# Patient Record
Sex: Male | Born: 1960 | Race: White | Hispanic: No | Marital: Single | Smoking: Never smoker
Health system: Southern US, Community
[De-identification: ages and names within clinical notes are randomized; demographics above are authoritative.]

## PROBLEM LIST (undated history)

## (undated) DIAGNOSIS — M1990 Unspecified osteoarthritis, unspecified site: Secondary | ICD-10-CM

## (undated) DIAGNOSIS — C61 Malignant neoplasm of prostate: Secondary | ICD-10-CM

## (undated) DIAGNOSIS — Z801 Family history of malignant neoplasm of trachea, bronchus and lung: Secondary | ICD-10-CM

## (undated) DIAGNOSIS — C772 Secondary and unspecified malignant neoplasm of intra-abdominal lymph nodes: Secondary | ICD-10-CM

## (undated) DIAGNOSIS — Z87442 Personal history of urinary calculi: Secondary | ICD-10-CM

## (undated) DIAGNOSIS — Z807 Family history of other malignant neoplasms of lymphoid, hematopoietic and related tissues: Secondary | ICD-10-CM

## (undated) DIAGNOSIS — B1910 Unspecified viral hepatitis B without hepatic coma: Secondary | ICD-10-CM

## (undated) DIAGNOSIS — Z8042 Family history of malignant neoplasm of prostate: Secondary | ICD-10-CM

## (undated) HISTORY — DX: Secondary and unspecified malignant neoplasm of intra-abdominal lymph nodes: C61

## (undated) HISTORY — PX: PROSTATE BIOPSY: SHX241

## (undated) HISTORY — DX: Family history of malignant neoplasm of prostate: Z80.42

## (undated) HISTORY — DX: Family history of malignant neoplasm of trachea, bronchus and lung: Z80.1

## (undated) HISTORY — DX: Family history of other malignant neoplasms of lymphoid, hematopoietic and related tissues: Z80.7

## (undated) HISTORY — DX: Malignant neoplasm of prostate: C77.2

## (undated) HISTORY — PX: LEG SURGERY: SHX1003

---
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
## 2017-07-12 ENCOUNTER — Other Ambulatory Visit: Payer: Self-pay

## 2017-07-12 DIAGNOSIS — Y929 Unspecified place or not applicable: Secondary | ICD-10-CM | POA: Insufficient documentation

## 2017-07-12 DIAGNOSIS — T18128A Food in esophagus causing other injury, initial encounter: Secondary | ICD-10-CM | POA: Insufficient documentation

## 2017-07-12 DIAGNOSIS — X58XXXA Exposure to other specified factors, initial encounter: Secondary | ICD-10-CM | POA: Insufficient documentation

## 2017-07-12 NOTE — ED Triage Notes (Signed)
Pt brought to ED by [facility] from [facility] for a chicken bond stuck on his throat, DG from [place] shows the bond on his throat, pt sent here for further evaluation.

## 2017-07-13 ENCOUNTER — Encounter (HOSPITAL_COMMUNITY)
Admission: EM | Disposition: A | Discharge status: Home / Self Care | Payer: Self-pay | Source: Home / Self Care | Attending: Emergency Medicine

## 2017-07-13 ENCOUNTER — Ambulatory Visit (HOSPITAL_COMMUNITY)
Admission: EM | Admit: 2017-07-13 | Discharge: 2017-07-13 | Disposition: A | Discharge status: Home / Self Care | Payer: Self-pay | Attending: Emergency Medicine | Admitting: Emergency Medicine

## 2017-07-13 ENCOUNTER — Encounter (HOSPITAL_COMMUNITY): Payer: Self-pay | Admitting: Emergency Medicine

## 2017-07-13 ENCOUNTER — Other Ambulatory Visit: Payer: Self-pay

## 2017-07-13 ENCOUNTER — Emergency Department (HOSPITAL_COMMUNITY): Payer: Self-pay | Admitting: Anesthesiology

## 2017-07-13 DIAGNOSIS — T17208A Unspecified foreign body in pharynx causing other injury, initial encounter: Secondary | ICD-10-CM

## 2017-07-13 HISTORY — PX: FOREIGN BODY REMOVAL: SHX962

## 2017-07-13 HISTORY — PX: RIGID ESOPHAGOSCOPY: SHX5226

## 2017-07-13 SURGERY — ESOPHAGOSCOPY, RIGID
Anesthesia: General | Site: Throat

## 2017-07-13 MED ORDER — ONDANSETRON HCL 4 MG/2ML IJ SOLN
INTRAMUSCULAR | Status: DC | PRN
  Administered 2017-07-13: 4 mg via INTRAVENOUS

## 2017-07-13 MED ORDER — FENTANYL CITRATE (PF) 250 MCG/5ML IJ SOLN
INTRAMUSCULAR | Status: AC
  Filled 2017-07-13: qty 5

## 2017-07-13 MED ORDER — PROPOFOL 10 MG/ML IV BOLUS
INTRAVENOUS | Status: AC
  Filled 2017-07-13: qty 20

## 2017-07-13 MED ORDER — 0.9 % SODIUM CHLORIDE (POUR BTL) OPTIME
TOPICAL | Status: DC | PRN
  Administered 2017-07-13: 1000 mL

## 2017-07-13 MED ORDER — LIDOCAINE VISCOUS 2 % MT SOLN
30.0000 mL | Freq: Once | OROMUCOSAL | Status: AC
  Administered 2017-07-13: 30 mL via OROMUCOSAL
  Filled 2017-07-13: qty 30

## 2017-07-13 MED ORDER — LIDOCAINE HCL (CARDIAC) 20 MG/ML IV SOLN
INTRAVENOUS | Status: DC | PRN
  Administered 2017-07-13: 80 mg via INTRATRACHEAL

## 2017-07-13 MED ORDER — SUCCINYLCHOLINE 20MG/ML (10ML) SYRINGE FOR MEDFUSION PUMP - OPTIME
INTRAMUSCULAR | Status: DC | PRN
  Administered 2017-07-13: 120 mg via INTRAVENOUS

## 2017-07-13 MED ORDER — MIDAZOLAM HCL 2 MG/2ML IJ SOLN
INTRAMUSCULAR | Status: AC
  Filled 2017-07-13: qty 2

## 2017-07-13 MED ORDER — LIDOCAINE 2% (20 MG/ML) 5 ML SYRINGE
INTRAMUSCULAR | Status: AC
  Filled 2017-07-13: qty 5

## 2017-07-13 MED ORDER — DEXAMETHASONE SODIUM PHOSPHATE 10 MG/ML IJ SOLN
INTRAMUSCULAR | Status: DC | PRN
  Administered 2017-07-13: 10 mg via INTRAVENOUS

## 2017-07-13 MED ORDER — FENTANYL CITRATE (PF) 250 MCG/5ML IJ SOLN
INTRAMUSCULAR | Status: DC | PRN
  Administered 2017-07-13: 50 ug via INTRAVENOUS

## 2017-07-13 MED ORDER — SUCCINYLCHOLINE CHLORIDE 200 MG/10ML IV SOSY
PREFILLED_SYRINGE | INTRAVENOUS | Status: AC
  Filled 2017-07-13: qty 10

## 2017-07-13 MED ORDER — MIDAZOLAM HCL 2 MG/2ML IJ SOLN
INTRAMUSCULAR | Status: DC | PRN
  Administered 2017-07-13: 2 mg via INTRAVENOUS

## 2017-07-13 MED ORDER — PROPOFOL 10 MG/ML IV BOLUS
INTRAVENOUS | Status: DC | PRN
  Administered 2017-07-13: 200 mg via INTRAVENOUS

## 2017-07-13 MED ORDER — OXYMETAZOLINE HCL 0.05 % NA SOLN
1.0000 | Freq: Once | NASAL | Status: AC
  Administered 2017-07-13: 1 via NASAL
  Filled 2017-07-13: qty 15

## 2017-07-13 MED ORDER — LACTATED RINGERS IV SOLN
INTRAVENOUS | Status: DC | PRN
  Administered 2017-07-13: 02:00:00 via INTRAVENOUS

## 2017-07-13 MED ORDER — FENTANYL CITRATE (PF) 100 MCG/2ML IJ SOLN: 25.0000 ug | INTRAMUSCULAR | Status: DC | PRN

## 2017-07-13 SURGICAL SUPPLY — 21 items
BALLN PULM 15 16.5 18X75 (BALLOONS)
BALLOON PULM 15 16.5 18X75 (BALLOONS) IMPLANT
CANISTER SUCT 3000ML PPV (MISCELLANEOUS) ×2 IMPLANT
CONT SPEC 4OZ CLIKSEAL STRL BL (MISCELLANEOUS) IMPLANT
COVER BACK TABLE 60X90IN (DRAPES) ×2 IMPLANT
COVER MAYO STAND STRL (DRAPES) ×2 IMPLANT
COVER SURGICAL LIGHT HANDLE (MISCELLANEOUS) ×2 IMPLANT
DRAPE HALF SHEET 40X57 (DRAPES) ×2 IMPLANT
GAUZE SPONGE 4X4 16PLY XRAY LF (GAUZE/BANDAGES/DRESSINGS) ×2 IMPLANT
GLOVE BIO SURGEON STRL SZ 6.5 (GLOVE) ×2 IMPLANT
GUARD TEETH (MISCELLANEOUS) IMPLANT
KIT BASIN OR (CUSTOM PROCEDURE TRAY) ×2 IMPLANT
KIT ROOM TURNOVER OR (KITS) ×2 IMPLANT
NS IRRIG 1000ML POUR BTL (IV SOLUTION) ×2 IMPLANT
PAD ARMBOARD 7.5X6 YLW CONV (MISCELLANEOUS) ×4 IMPLANT
PATTIES SURGICAL .5 X1 (DISPOSABLE) IMPLANT
PATTIES SURGICAL .5X1.5 (GAUZE/BANDAGES/DRESSINGS) ×2 IMPLANT
SURGILUBE 2OZ TUBE FLIPTOP (MISCELLANEOUS) IMPLANT
TOWEL NATURAL 6PK STERILE (DISPOSABLE) ×2 IMPLANT
TUBE CONNECTING 12X1/4 (SUCTIONS) ×2 IMPLANT
WATER STERILE IRR 1000ML POUR (IV SOLUTION) ×2 IMPLANT

## 2017-07-13 NOTE — ED Provider Notes (Signed)
[place] EMERGENCY DEPARTMENT Provider Note   CSN: [id number] Arrival date & time: 07/12/17  2349     History   Chief Complaint Chief Complaint  Patient presents with  . Foreing Body in throat    HPI Randall Dawson is a 56 y.o. male.  The history is provided by the patient and medical records.     56 y.o. M presenting to the ED from [facility] to see ENT for chicken bone in the throat.  Patient was eating dinner from [facility] around 6:30 PM and felt like he got some bone stuck in the throat.  States he has had trouble swallowing but no trouble breathing, etc.  No hx of swallowing issues in the past.  No prior EGDs.  History reviewed. No pertinent past medical history.  There are no active problems to display for this patient.   History reviewed. No pertinent surgical history.     Home Medications    Prior to Admission medications   Not on File    Family History History reviewed. No pertinent family history.  Social History Social History   Tobacco Use  . Smoking status: Never Smoker  . Smokeless tobacco: Never Used  Substance Use Topics  . Alcohol use: No    Frequency: Never  . Drug use: No     Allergies   Patient has no known allergies.   Review of Systems Review of Systems  HENT: Positive for trouble swallowing.   All other systems reviewed and are negative.    Physical Exam Updated Vital Signs BP 130/84 (BP Location: Right Arm)   Pulse 64   Temp (!) 97.5 F (36.4 C) (Oral)   Resp 16   SpO2 100%   Physical Exam  Constitutional: He is oriented to person, place, and time. He appears well-developed and well-nourished.  HENT:  Head: Normocephalic and atraumatic.  Mouth/Throat: Oropharynx is clear and moist.  Posterior oropharynx clear; handling secretions well, appears to have discomfort with talking, no stridor  Eyes: Conjunctivae and EOM are normal. Pupils are equal, round, and reactive to light.  Neck:  Normal range of motion.  Cardiovascular: Normal rate, regular rhythm and normal heart sounds.  Pulmonary/Chest: Effort normal and breath sounds normal. No stridor. No respiratory distress.  Abdominal: Soft. Bowel sounds are normal. There is no tenderness. There is no rebound.  Musculoskeletal: Normal range of motion.  Neurological: He is alert and oriented to person, place, and time.  Skin: Skin is warm and dry.  Psychiatric: He has a normal mood and affect.  Nursing note and vitals reviewed.    ED Treatments / Results  Labs (all labs ordered are listed, but only abnormal results are displayed) Labs Reviewed - No data to display  EKG  EKG Interpretation None       Radiology No results found.  Procedures Procedures (including critical care time)  Medications Ordered in ED Medications - No data to display   Initial Impression / Assessment and Plan / ED Course  I have reviewed the triage vital signs and the nursing notes.  Pertinent labs & imaging results that were available during my care of the patient were reviewed by me and considered in my medical decision making (see chart for details).  56 year old male sent here from [facility] to be evaluated by ENT.  He has small chicken bone in the throat since eating dinner this evening from [facility].  No respiratory distress noted on my evaluation.  He does  seem to have discomfort with talking but is handling his secretions well, no stridor.  On call ENT, Dr. Blenda Nicely, paged and has attempted to scope patient in the room, however FB not visualized.  Patient taken to OR for removal.  Final Clinical Impressions(s) / ED Diagnoses   Final diagnoses:  Foreign body in throat, initial encounter    ED Discharge Orders    None       Larene Pickett, PA-C 07/13/17 0316    Orpah Greek, MD 07/14/17 [phone]

## 2017-07-13 NOTE — Anesthesia Preprocedure Evaluation (Addendum)
Anesthesia Evaluation  Patient identified by MRN, date of birth, ID band  Reviewed: Allergy & Precautions, NPO status , Patient's Chart, lab work & pertinent test results  History of Anesthesia Complications (+) PONV  Airway Mallampati: II       Dental  (+) Edentulous Upper, Dental Advisory Given   Pulmonary Current Smoker,    Pulmonary exam normal breath sounds clear to auscultation       Cardiovascular negative cardio ROS   Rhythm:Regular Rate:Normal     Neuro/Psych    GI/Hepatic Neg liver ROS, History noted. CG   Endo/Other  negative endocrine ROS  Renal/GU      Musculoskeletal   Abdominal   Peds  Hematology   Anesthesia Other Findings   Reproductive/Obstetrics                            Anesthesia Physical Anesthesia Plan  ASA: II and emergent  Anesthesia Plan: General   Post-op Pain Management:    Induction: Intravenous, Rapid sequence and Cricoid pressure planned  PONV Risk Score and Plan: 3 and Ondansetron, Dexamethasone, Treatment may vary due to age or medical condition and Midazolam  Airway Management Planned: Oral ETT  Additional Equipment:   Intra-op Plan:   Post-operative Plan: Extubation in OR  Informed Consent: I have reviewed the patients History and Physical, chart, labs and discussed the procedure including the risks, benefits and alternatives for the proposed anesthesia with the patient or authorized representative who has indicated his/her understanding and acceptance.   Dental advisory given  Plan Discussed with: Anesthesiologist, Surgeon and CRNA  Anesthesia Plan Comments:        Anesthesia Quick Evaluation

## 2017-07-13 NOTE — Op Note (Signed)
DATE OF PROCEDURE:  07/13/2017     PRE-OPERATIVE DIAGNOSIS:  esophageal foreign body     POST-OPERATIVE DIAGNOSIS:  Same    PROCEDURE(S):  Rigid esophagoscopy with removal of foreign body    SURGEON:  Helayne Seminole, MD    ASSISTANT(S):  none    ANESTHESIA:  General endotracheal anesthesia       ESTIMATED BLOOD LOSS:  none    SPECIMENS:  Chicken bone for gross ID      COMPLICATIONS:  None     OPERATIVE FINDINGS:  There was a large, 2.5cm chicken bone stuck in the right hypopharynx. There was a grade I mucosal injury adjacent to this, no esophageal perforation or further injury.    OPERATIVE DETAILS:  The patient was brought to the operating room and placed in the supine position. General anesthesia was induced and the patient was intubated by anesthesia. The bed was turned 90 degrees. A gauze was placed to protect the patient's gums. A Dedo laryngoscope was used to examine the hypopharynx and esophageal inlet. A chicken bone was identified and removed with Alligator forceps. Next, the foreign body and laryngoscope were removed. The rigid esophagoscope was then passed gently down the esophagus and used to inspect for further injury with findings as noted above. The scope was removed. The gauze was removed. The patient was then transferred to the care of the anesthesia staff, awakened, and taken to PACU in good condition.

## 2017-07-13 NOTE — Anesthesia Postprocedure Evaluation (Signed)
Anesthesia Post Note  Patient: Randall Dawson  Procedure(s) Performed: RIGID ESOPHAGOSCOPY (N/A Throat) FOREIGN BODY REMOVAL ADULT (N/A Throat)     Patient location during evaluation: PACU Anesthesia Type: General Level of consciousness: awake Pain management: pain level controlled Respiratory status: spontaneous breathing Cardiovascular status: stable Anesthetic complications: no    Last Vitals:  Vitals:   07/13/17 0240 07/13/17 0245  BP:  126/87  Pulse: 67 75  Resp: 14 19  Temp:  36.6 C  SpO2: 97% 97%    Last Pain:  Vitals:   07/13/17 0045  TempSrc:   PainSc: 7                  Mila Pair

## 2017-07-13 NOTE — Transfer of Care (Signed)
Immediate Anesthesia Transfer of Care Note  Patient: Randall Dawson  Procedure(s) Performed: RIGID ESOPHAGOSCOPY (N/A Throat) FOREIGN BODY REMOVAL ADULT (N/A Throat)  Patient Location: PACU  Anesthesia Type:General  Level of Consciousness: awake, alert  and oriented  Airway & Oxygen Therapy: Patient Spontanous Breathing  Post-op Assessment: Report given to RN and Post -op Vital signs reviewed and stable  Post vital signs: Reviewed and stable  Last Vitals:  Vitals:   07/13/17 0130 07/13/17 0230  BP: 139/79   Pulse: (!) 51   Resp: 15   Temp:  (P) 36.5 C  SpO2: 99%     Last Pain:  Vitals:   07/13/17 0045  TempSrc:   PainSc: 7          Complications: No apparent anesthesia complications

## 2017-07-13 NOTE — Anesthesia Procedure Notes (Signed)
Procedure Name: Intubation Date/Time: 07/13/2017 2:08 AM Performed by: Valetta Fuller, CRNA Pre-anesthesia Checklist: Patient identified, Emergency Drugs available, Suction available and Patient being monitored Patient Re-evaluated:Patient Re-evaluated prior to induction Oxygen Delivery Method: Circle system utilized Preoxygenation: Pre-oxygenation with 100% oxygen Induction Type: IV induction, Rapid sequence and Cricoid Pressure applied Laryngoscope Size: Miller and 2 Grade View: Grade I Tube type: Oral Tube size: 7.5 mm Number of attempts: 1 Airway Equipment and Method: Stylet Placement Confirmation: ETT inserted through vocal cords under direct vision,  positive ETCO2 and breath sounds checked- equal and bilateral Secured at: 23 cm Tube secured with: Tape Dental Injury: Teeth and Oropharynx as per pre-operative assessment

## 2017-07-13 NOTE — Discharge Instructions (Signed)
-  Clear liquid diet this morning, then advance diet slowly as tolerated to a regular diet -Resume regular activity -Call our office if you develop difficulty with neck turning, swallowing, fever, or malaise.  -It is normal to have a bit of a sore throat, jaw, or to feel a little tired. Take tylenol and ibuprofen as needed for pain.  -Drink lots of liquids.   Helayne Seminole, MD  Call [facility] at [phone] as needed.

## 2017-07-13 NOTE — Consult Note (Addendum)
[facility]  Primary Care Physician: Glenda Chroman, MD Patient Location at Initial Consult: Emergency Department Chief Complaint/Reason for Consult: esophageal foreign body  History of Presenting Illness:  History obtained from patient Randall Dawson is a  56 y.o. male presenting with  Swallowed chicken bone. This happened earlier this evening at 6:30pm when he was eating from fried [place]. He felt a small bone go down his throat. He did swallow it. There was no aspiration. He reports significant odynophagia since that time. He is tolerating his own saliva. No prior esophageal surgeries. No spitting up blood or difficulty with head turning.   Smokes 1ppd, no EtOH.  History reviewed. No pertinent past medical history.  History reviewed. No pertinent surgical history. L LE rod placement  History reviewed. No pertinent family history.  Social History   Socioeconomic History  . Marital status: Divorced    Spouse name: None  . Number of children: None  . Years of education: None  . Highest education level: None  Social Needs  . Financial resource strain: None  . Food insecurity - worry: None  . Food insecurity - inability: None  . Transportation needs - medical: None  . Transportation needs - non-medical: None  Occupational History  . None  Tobacco Use  . Smoking status: Never Smoker  . Smokeless tobacco: Never Used  Substance and Sexual Activity  . Alcohol use: No    Frequency: Never  . Drug use: No  . Sexual activity: None  Other Topics Concern  . None  Social History Narrative  . None    No current facility-administered medications on file prior to encounter.    No current outpatient medications on file prior to encounter.    No Known Allergies   Review of Systems: Negative except for the above   OBJECTIVE: Vital Signs: Vitals:   07/13/17 0006  BP: 130/84  Pulse: 64  Resp: 16  Temp: (!) 97.5 F  (36.4 C)  SpO2: 100%    I&O No intake or output data in the 24 hours ending 07/13/17 0055  Physical Exam General: Well developed, well nourished. No acute distress. Voice normal, no dysphonia  Head/Face: Normocephalic, atraumatic. No scars or lesions. No sinus tenderness. Facial nerve intact and equal bilaterally.  No facial lacerations. Salivary glands non tender and without palpable masses  Eyes: Globes well positioned, no proptosis Lids: No periorbital edema/ecchymosis. No lid laceration Conjunctiva: No chemosis, hemorrhage Pupil: PERRLA Extra occular movement: Full ROM bilaterally. No gaze restriction   Ears: No gross deformity. Normal external canal. Tympanic membrane INTACT bilaterally Left ear with cerumen impaction  Hearing:  Normal speech reception.  Nose: No gross deformity or lesions. No purulent discharge. Septum is deviated anteriorly to the left, posteriorly it buckles to the right  Mouth/Oropharynx: Lips without any lesions. Dentition edentulous except for one piece of tooth along left mandibular incisor. No mucosal lesions within the oropharynx. No tonsillar enlargement, exudate, or lesions. Pharyngeal walls symmetrical. Uvula midline. Tongue midline without lesions.  Larynx: See TFL  Nasopharynx: See TFL  Neck: Trachea midline. No masses. No thyromegaly or nodules palpated. No crepitus.  Lymphatic: No lymphadenopathy in the neck.  Respiratory: No stridor or distress.  Cardiovascular: Regular rate and rhythm.  Extremities: No edema or cyanosis. Warm and well-perfused.  Skin: No scars or lesions on face or neck.  Neurologic: CN II-XII intact. Moving all extremities without gross abnormality.  Other:      Labs: No  results found for: WBC, HGB, HCT, PLT, CHOL, TRIG, HDL, LDLDIRECT, ALT, AST, NA, K, CL, CREATININE, BUN, CO2, TSH, PSA, INR, GLUF, HGBA1C, MICROALBUR   Review of Ancillary Data / Diagnostic Tests: Discussed case with [facility] ED.  Reviewed lateral neck  film personally- there is a small radiodense vertical FB in high esophagus    Procedure: Transnasal Flexible Laryngoscopy  Preoperative Diagnosis: FB in hypopharynx Postoperative Diagnosis: FB in esophagus Procedure: Transnasal fiberoptic laryngoscopy.  Estimated Blood Loss: 0 mL.  Complications: None.  Findings: The nasal cavity and nasopharynx are unremarkable. There are no suspicious findings in the nasopharynx or Fossa of Rosenmller. The tongue base, pharyngeal walls, piriform sinuses, vallecula, epiglottis and postcricoid region are normal in appearance. The visualized portion of the subglottis and proximal trachea is widely patent. The vocal folds are mobile bilaterally. There are no lesions or significant inflammation. There is no glottal insufficiency. There is no pooling of secretions or aspiration. Could not visualize FB on TFL examination.   Description of Procedure: With the patient in the sitting position, topical  Afrin-lidocaine mixture in an atomizer was applied to the nose. The scope was passed through the nose. Examination was carried out of the nose, nasopharynx, oropharynx, hypopharynx, and larynx with findings as noted above. Scope was removed.  The patient tolerated the procedure well.      ASSESSMENT:  56 y.o. male with esophageal foreign body.  RECOMMENDATIONS:  To OR for rigid esophagoscopy with removal of foreign body, risks and benefits discussed. Consent obtained.     Gavin Pound, MD  [facility] [place] Office phone [phone]

## 2017-07-14 ENCOUNTER — Encounter (HOSPITAL_COMMUNITY): Payer: Self-pay | Admitting: Otolaryngology

## 2019-12-26 ENCOUNTER — Other Ambulatory Visit: Payer: Self-pay

## 2019-12-26 ENCOUNTER — Encounter (HOSPITAL_COMMUNITY): Payer: Self-pay

## 2019-12-27 ENCOUNTER — Inpatient Hospital Stay (HOSPITAL_COMMUNITY): Payer: 59 | Attending: Hematology | Admitting: Hematology

## 2019-12-27 DIAGNOSIS — Z8042 Family history of malignant neoplasm of prostate: Secondary | ICD-10-CM | POA: Insufficient documentation

## 2019-12-27 DIAGNOSIS — Z191 Hormone sensitive malignancy status: Secondary | ICD-10-CM | POA: Insufficient documentation

## 2019-12-27 DIAGNOSIS — Z87891 Personal history of nicotine dependence: Secondary | ICD-10-CM | POA: Diagnosis not present

## 2019-12-27 DIAGNOSIS — M25552 Pain in left hip: Secondary | ICD-10-CM | POA: Insufficient documentation

## 2019-12-27 DIAGNOSIS — C778 Secondary and unspecified malignant neoplasm of lymph nodes of multiple regions: Secondary | ICD-10-CM | POA: Insufficient documentation

## 2019-12-27 DIAGNOSIS — C61 Malignant neoplasm of prostate: Secondary | ICD-10-CM | POA: Insufficient documentation

## 2019-12-27 DIAGNOSIS — Z806 Family history of leukemia: Secondary | ICD-10-CM | POA: Insufficient documentation

## 2019-12-27 DIAGNOSIS — C772 Secondary and unspecified malignant neoplasm of intra-abdominal lymph nodes: Secondary | ICD-10-CM

## 2019-12-27 NOTE — Progress Notes (Signed)
[facility] CONSULT NOTE  Patient Care Team: Glenda Chroman, MD as PCP - General (Internal Medicine)  CHIEF COMPLAINTS/PURPOSE OF CONSULTATION:  Hormone sensitive metastatic prostate cancer.  HISTORY OF PRESENTING ILLNESS:  Randall Dawson 59 y.o. male is seen in consultation today for further work-up and management of hormone sensitive metastatic prostate cancer.  He was found to have elevated PSA on routine labs.  He was evaluated by Dr. Darcus Austin at [facility] and prostate biopsy was performed on 12/03/2019 which showed Gleason 4+4= 8, PSA of 119.87.  Total testosterone on 12/17/2019 was 197.  Bone scan showed small focus of uptake in the left inferior orbit with no other evidence of metastatic disease.  A CT of the abdomen and pelvis with contrast showed left para-aortic lymph node measuring 15 mm, right external iliac node measuring 22 x 35 mm, left external iliac node measuring 26 x 35 mm.  There are several enlarged lymph nodes in the retroperitoneum and bilateral iliac chain.  There is ill-defined hypoenhancing 12 mm hepatic lesion, indeterminate, concerning for metastatic disease.  Sclerotic focus in the right posterior ilium, indeterminate for metastasis.  He also reports left hip pain for the past 2 months, mainly on walking and getting in and out of the car.  He is self-employed and works for [employer].  He quit smoking cigarettes 2 and half years ago.  He smoked 1 pack/day for 30+ years.  Family history significant for father who died of prostate cancer at age 27.  Sister had cancer on her leg.  Paternal uncle also had cancer, patient does not know the type.  MEDICAL HISTORY:  Past Medical History:  Diagnosis Date  . Prostate cancer [facility])     SURGICAL HISTORY: Past Surgical History:  Procedure Laterality Date  . FOREIGN BODY REMOVAL N/A 07/13/2017   Procedure: FOREIGN BODY REMOVAL ADULT;  Surgeon: Helayne Seminole, MD;  Location: [place];  Service: ENT;  Laterality: N/A;  . LEG SURGERY     rods in leg from fracture  . RIGID ESOPHAGOSCOPY N/A 07/13/2017   Procedure: RIGID ESOPHAGOSCOPY;  Surgeon: Helayne Seminole, MD;  Location: [facility] OR;  Service: ENT;  Laterality: N/A;    SOCIAL HISTORY: Social History   Socioeconomic History  . Marital status: Divorced    Spouse name: Not on file  . Number of children: 0  . Years of education: Not on file  . Highest education level: Not on file  Occupational History  . Occupation: EMPLOYED  Tobacco Use  . Smoking status: Never Smoker  . Smokeless tobacco: Never Used  Substance and Sexual Activity  . Alcohol use: No  . Drug use: No  . Sexual activity: Not Currently  Other Topics Concern  . Not on file  Social History Narrative  . Not on file   Social Determinants of Health   Financial Resource Strain: Medium Risk  . Difficulty of Paying Living Expenses: Somewhat hard  Food Insecurity: Food Insecurity Present  . Worried About Charity fundraiser in the Last Year: Sometimes true  . Ran Out of Food in the Last Year: Sometimes true  Transportation Needs: No Transportation Needs  . Lack of Transportation (Medical): No  . Lack of Transportation (Non-Medical): No  Physical Activity: Inactive  . Days of Exercise per Week: 0 days  . Minutes of Exercise per Session: 0 min  Stress: Stress Concern Present  . Feeling of Stress : To some  extent  Social Connections: Somewhat Isolated  . Frequency of Communication with Friends and Family: Twice a week  . Frequency of Social Gatherings with Friends and Family: Twice a week  . Attends Religious Services: More than 4 times per year  . Active Member of Clubs or Organizations: No  . Attends Archivist Meetings: Never  . Marital Status: Divorced  Human resources officer Violence: Not At Risk  . Fear of Current or Ex-Partner: No  . Emotionally Abused: No  . Physically Abused: No  . Sexually Abused: No     FAMILY HISTORY: Family History  Problem Relation Age of Onset  . Dementia Mother   . Prostate cancer Father   . Cancer Sister   . Lymphoma Brother     ALLERGIES:  has No Known Allergies.  MEDICATIONS:  No current outpatient medications on file.   No current facility-administered medications for this visit.    REVIEW OF SYSTEMS:   Constitutional: Denies fevers, chills or abnormal night sweats Eyes: Denies blurriness of vision, double vision or watery eyes Ears, nose, mouth, throat, and face: Denies mucositis or sore throat Respiratory: Denies cough, dyspnea or wheezes Cardiovascular: Denies palpitation, chest discomfort or lower extremity swelling Gastrointestinal: Positive for constipation. Skin: Denies abnormal skin rashes Lymphatics: Denies new lymphadenopathy or easy bruising Neurological:Denies numbness, tingling or new weaknesses Behavioral/Psych: Mood is stable, no new changes  All other systems were reviewed with the patient and are negative.  PHYSICAL EXAMINATION: ECOG PERFORMANCE STATUS: 0 - Asymptomatic  Vitals:   12/27/19 0809  BP: 117/79  Pulse: 74  Resp: 18  Temp: (!) 97.3 F (36.3 C)  SpO2: 95%   Filed Weights   12/27/19 0809  Weight: 224 lb 1.6 oz (101.7 kg)    GENERAL:alert, no distress and comfortable SKIN: skin color, texture, turgor are normal, no rashes or significant lesions EYES: normal, conjunctiva are pink and non-injected, sclera clear OROPHARYNX:no exudate, no erythema and lips, buccal mucosa, and tongue normal  NECK: supple, thyroid normal size, non-tender, without nodularity LYMPH:  no palpable lymphadenopathy in the cervical, axillary or inguinal LUNGS: clear to auscultation and percussion with normal breathing effort HEART: regular rate & rhythm and no murmurs and no lower extremity edema ABDOMEN:abdomen soft, non-tender and normal bowel sounds Musculoskeletal:no cyanosis of digits and no clubbing  PSYCH: alert & oriented x  3 with fluent speech NEURO: no focal motor/sensory deficits  LABORATORY DATA:  I have reviewed the data as listed No results found for: WBC, HGB, HCT, MCV, PLT   Chemistry   No results found for: NA, K, CL, CO2, BUN, CREATININE, GLU No results found for: CALCIUM, ALKPHOS, AST, ALT, BILITOT     RADIOGRAPHIC STUDIES: I have personally reviewed the radiological images as listed and agreed with the findings in the report.  ASSESSMENT:  1.  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  Serum testosterone on 12/17/2019 of 197. -Bone scan on 12/18/2019 at [facility] shows small focus of uptake seen left inferior orbit favoring benign etiology.  No suspicious foci of bone meta stasis. -CTAP with contrast on 12/18/2019 showed retroperitoneal and bilateral iliac chain metastatic adenopathy.  Left para-aortic lymph node measures 15 mm.  Right external iliac lymph node measures 22 x 35 mm.  Left external iliac lymph node measures 26 x 35 mm.  Ill-defined hypoenhancing 12 mm hepatic lesion, indeterminate, however concerning for metastasis.  Sclerotic focus in the  right posterior ilium, indeterminate.  2.  Family history: -Father died of prostate cancer at age 58.  Sister had cancer on her leg resected.  Paternal uncle had cancer, patient does not know the type. -I have recommended germline mutation testing.  PLAN:  1.  Metastatic castration sensitive prostate cancer: -We discussed the normal prognosis of metastatic castration sensitive prostate cancer to the lymph nodes. -I have recommended degarelix 240 mg subcu, followed by Lupron 45 mg intramuscularly in a month. -He has low-volume disease based on current scans.  However the hypodense liver lesion is concerning.  I have recommended MRI with and without contrast to further evaluate this lesion. -If liver lesion is suspicious  for metastatic disease on MRI, he will be considered as having high-volume metastatic disease because of the presence of visceral metastasis.  I would recommend docetaxel for 6 cycles along with ADT. -If liver lesion is not consistent with metastatic disease, will consider abiraterone and prednisone. -We will also consider somatic mutation testing on the prostate biopsy down the line.  2.  Family history: -Because of high risk family history, I have recommended genetic testing.  3.  Left hip pain: -Patient reports that he had left hip pain for the past 2 months.  Pain is present when he walks and when he tries to get in and out of the car. -Patient does have some knee pain on the right side from arthritis.  He thinks he might be compensating more on the left side.  Bone scan did not show any lesions in the left hip.  We will keep a close eye on it.  Orders Placed This Encounter  Procedures  . MR LIVER W WO CONTRAST    Standing Status:   Future    Standing Expiration Date:   12/26/2020    Order Specific Question:   ** REASON FOR EXAM (FREE TEXT)    Answer:   metastatic prostate cancer    Order Specific Question:   If indicated for the ordered procedure, I authorize the administration of contrast media per Radiology protocol    Answer:   Yes    Order Specific Question:   What is the patient's sedation requirement?    Answer:   No Sedation    Order Specific Question:   Does the patient have a pacemaker or implanted devices?    Answer:   No    Order Specific Question:   Radiology Contrast Protocol - do NOT remove file path    Answer:   [email or web address]    Order Specific Question:   Preferred imaging location?    Answer:   [facility] (table limit [phone])    All questions were answered. The patient knows to call the clinic with any problems, questions or concerns.      Derek Jack, MD 12/28/2019 1:42 PM

## 2019-12-27 NOTE — Patient Instructions (Addendum)
[facility] at [facility] Discharge Instructions  You were seen today by Dr. Delton Coombes. He went over your history, family history, and how you've been feeling lately. Your biopsy shows that you have prostate cancer. Your bone scan didn't show any bone lesions. The hip pain is likely arthritis. The CT scan shows that you have a few lymph nodes that your cancer has spread to. He will schedule you for a MRI of your liver to evaluate for cancer there. Primary treatment for prostate cancer consists of injections, that are given monthly then every 6 months, these injections help decrease your testosterone. Try taking colace 2 pills at bedtime to help with constipation. He will see you back after your scan for follow up.   Thank you for choosing [place] at [facility] to provide your oncology and hematology care.  To afford each patient quality time with our provider, please arrive at least 15 minutes before your scheduled appointment time.   If you have a lab appointment with the [facility] please come in thru the  Main Entrance and check in at the main information desk  You need to re-schedule your appointment should you arrive 10 or more minutes late.  We strive to give you quality time with our providers, and arriving late affects you and other patients whose appointments are after yours.  Also, if you no show three or more times for appointments you may be dismissed from the clinic at the providers discretion.     Again, thank you for choosing [facility].  Our hope is that these requests will decrease the amount of time that you wait before being seen by our physicians.       _____________________________________________________________  Should you have questions after your visit to [facility], please contact our office at [phone] between the hours of 8:00 a.m. and 4:30 p.m.  Voicemails left after 4:00 p.m. will not  be returned until the following business day.  For prescription refill requests, have your pharmacy contact our office and allow 72 hours.    Cancer Center Support Programs:   > Cancer Support Group  2nd Tuesday of the month 1pm-2pm, Journey Room

## 2020-01-10 ENCOUNTER — Other Ambulatory Visit: Payer: Self-pay

## 2020-01-10 ENCOUNTER — Ambulatory Visit (HOSPITAL_COMMUNITY)
Admission: RE | Admit: 2020-01-10 | Discharge: 2020-01-10 | Disposition: A | Discharge status: Home / Self Care | Payer: 59 | Source: Ambulatory Visit | Attending: Hematology | Admitting: Hematology

## 2020-01-10 DIAGNOSIS — C61 Malignant neoplasm of prostate: Secondary | ICD-10-CM | POA: Insufficient documentation

## 2020-01-10 DIAGNOSIS — C772 Secondary and unspecified malignant neoplasm of intra-abdominal lymph nodes: Secondary | ICD-10-CM | POA: Diagnosis present

## 2020-01-10 MED ORDER — GADOBUTROL 1 MMOL/ML IV SOLN
10.0000 mL | Freq: Once | INTRAVENOUS | Status: AC | PRN
  Administered 2020-01-10: 10 mL via INTRAVENOUS

## 2020-01-14 ENCOUNTER — Other Ambulatory Visit (HOSPITAL_COMMUNITY): Payer: Self-pay | Admitting: Oncology

## 2020-01-14 ENCOUNTER — Ambulatory Visit (HOSPITAL_COMMUNITY)
Admission: RE | Admit: 2020-01-14 | Discharge: 2020-01-14 | Disposition: A | Discharge status: Home / Self Care | Payer: 59 | Source: Ambulatory Visit | Attending: Oncology | Admitting: Oncology

## 2020-01-14 ENCOUNTER — Other Ambulatory Visit: Payer: Self-pay

## 2020-01-14 ENCOUNTER — Inpatient Hospital Stay (HOSPITAL_BASED_OUTPATIENT_CLINIC_OR_DEPARTMENT_OTHER): Payer: 59 | Admitting: Oncology

## 2020-01-14 ENCOUNTER — Inpatient Hospital Stay (HOSPITAL_COMMUNITY): Payer: 59

## 2020-01-14 ENCOUNTER — Encounter (HOSPITAL_COMMUNITY): Payer: Self-pay | Admitting: Oncology

## 2020-01-14 VITALS — BP 108/75 | HR 74 | Temp 97.7°F | Resp 18 | Wt 222.3 lb

## 2020-01-14 DIAGNOSIS — C772 Secondary and unspecified malignant neoplasm of intra-abdominal lymph nodes: Secondary | ICD-10-CM

## 2020-01-14 DIAGNOSIS — C61 Malignant neoplasm of prostate: Secondary | ICD-10-CM | POA: Insufficient documentation

## 2020-01-14 DIAGNOSIS — Z8619 Personal history of other infectious and parasitic diseases: Secondary | ICD-10-CM

## 2020-01-14 HISTORY — DX: Personal history of other infectious and parasitic diseases: Z86.19

## 2020-01-14 LAB — CBC WITH DIFFERENTIAL/PLATELET
Abs Immature Granulocytes: 0.03 10*3/uL (ref 0.00–0.07)
Basophils Absolute: 0.1 10*3/uL (ref 0.0–0.1)
Basophils Relative: 1 %
Eosinophils Absolute: 0.2 10*3/uL (ref 0.0–0.5)
Eosinophils Relative: 3 %
HCT: 42.9 % (ref 39.0–52.0)
Hemoglobin: 13.8 g/dL (ref 13.0–17.0)
Immature Granulocytes: 0 %
Lymphocytes Relative: 30 %
Lymphs Abs: 2.1 10*3/uL (ref 0.7–4.0)
MCH: 31.5 pg (ref 26.0–34.0)
MCHC: 32.2 g/dL (ref 30.0–36.0)
MCV: 97.9 fL (ref 80.0–100.0)
Monocytes Absolute: 0.8 10*3/uL (ref 0.1–1.0)
Monocytes Relative: 12 %
Neutro Abs: 3.9 10*3/uL (ref 1.7–7.7)
Neutrophils Relative %: 54 %
Platelets: 232 10*3/uL (ref 150–400)
RBC: 4.38 MIL/uL (ref 4.22–5.81)
RDW: 13.2 % (ref 11.5–15.5)
WBC: 7.1 10*3/uL (ref 4.0–10.5)
nRBC: 0 % (ref 0.0–0.2)

## 2020-01-14 LAB — COMPREHENSIVE METABOLIC PANEL
ALT: 10 U/L (ref 0–44)
AST: 15 U/L (ref 15–41)
Albumin: 4.2 g/dL (ref 3.5–5.0)
Alkaline Phosphatase: 72 U/L (ref 38–126)
Anion gap: 9 (ref 5–15)
BUN: 20 mg/dL (ref 6–20)
CO2: 27 mmol/L (ref 22–32)
Calcium: 9.4 mg/dL (ref 8.9–10.3)
Chloride: 102 mmol/L (ref 98–111)
Creatinine, Ser: 0.84 mg/dL (ref 0.61–1.24)
GFR calc Af Amer: >60 mL/min (ref >60)
GFR calc non Af Amer: >60 mL/min (ref >60)
Glucose, Bld: 94 mg/dL (ref 70–99)
Potassium: 4.1 mmol/L (ref 3.5–5.1)
Sodium: 138 mmol/L (ref 135–145)
Total Bilirubin: 1 mg/dL (ref 0.3–1.2)
Total Protein: 7.7 g/dL (ref 6.5–8.1)

## 2020-01-14 LAB — PSA: Prostatic Specific Antigen: 130.27 ng/mL — ABNORMAL HIGH (ref 0.00–4.00)

## 2020-01-14 MED ORDER — PREDNISONE 5 MG PO TABS: 5.0000 mg | ORAL_TABLET | Freq: Every day | ORAL | 11 refills | Status: DC

## 2020-01-14 MED ORDER — ABIRATERONE ACETATE 500 MG PO TABS: 1000.0000 mg | ORAL_TABLET | Freq: Every day | ORAL | 11 refills | Status: DC

## 2020-01-14 NOTE — Progress Notes (Signed)
Randall Chroman, MD [place] [address]  Prostate cancer metastatic to intraabdominal lymph node ([place]) - Plan: CBC with Differential, Comprehensive metabolic panel, PSA, DG Pelvis 1-2 Views, abiraterone acetate (ZYTIGA) 500 MG tablet, predniSONE (DELTASONE) 5 MG tablet, CANCELED: DG Thoracolumabar Spine  History of hepatitis B   HISTORY OF PRESENT ILLNESS: Metastatic castration sensitive prostate cancer with low-volume disease with subcentimeter lesion in the posterior hepatic right lobe (too small to characterize on MRI imaging on 01/10/2020).  Recommendation is to repeat CT abdomen (hepatic protocol) in 06/2020.  Bone scan performed at [facility] on 12/18/2019 was negative for osseous involvement of disease.  Degarelix 240 mg will be started on 01/29/2020 followed by lupron every 6 months.  Given his hepatic lesion is sub-centimeter and we are unable to determine if this is metastatic disease, will give him the benefit of the doubt and start abiraterone + prednsione.  We will also consider somatic mutation testing on the prostate biopsy down the line.  CURRENT STATUS: Randall Dawson 59 y.o. male returns for followup of for follow-up of newly diagnosed prostate cancer with questionable involvement of liver.  He is here today to review most recent MRI of liver and discuss medical oncology plans moving forward.  He remains asymptomatic.  He denies any new lumps or bumps on his examination.  No new pain.  Appetite is good and weight is stable.  No abdominal bloating or discomfort.  He denies any cough or hemoptysis.  He reports a history of hepatitis B in the past.  Review of Systems  Constitutional: Negative.  Negative for chills, fever and weight loss.  HENT: Negative.   Eyes: Negative.   Respiratory: Negative.  Negative for cough.   Cardiovascular: Negative.  Negative for chest pain.  Gastrointestinal: Negative.  Negative for blood in stool, constipation,  diarrhea, melena, nausea and vomiting.  Genitourinary: Negative.   Musculoskeletal: Negative.   Skin: Negative.   Neurological: Negative.  Negative for weakness.  Endo/Heme/Allergies: Negative.   Psychiatric/Behavioral: Negative.     Past Medical History:  Diagnosis Date  . History of hepatitis B 01/14/2020  . Prostate cancer [facility])     Past Surgical History:  Procedure Laterality Date  . FOREIGN BODY REMOVAL N/A 07/13/2017   Procedure: FOREIGN BODY REMOVAL ADULT;  Surgeon: Helayne Seminole, MD;  Location: [place];  Service: ENT;  Laterality: N/A;  . LEG SURGERY     rods in leg from fracture  . RIGID ESOPHAGOSCOPY N/A 07/13/2017   Procedure: RIGID ESOPHAGOSCOPY;  Surgeon: Helayne Seminole, MD;  Location: [facility];  Service: ENT;  Laterality: N/A;    Family History  Problem Relation Age of Onset  . Dementia Mother   . Prostate cancer Father   . Cancer Sister   . Lymphoma Brother     Social History   Socioeconomic History  . Marital status: Divorced    Spouse name: Not on file  . Number of children: 0  . Years of education: Not on file  . Highest education level: Not on file  Occupational History  . Occupation: EMPLOYED  Tobacco Use  . Smoking status: Never Smoker  . Smokeless tobacco: Never Used  Vaping Use  . Vaping Use: Never used  Substance and Sexual Activity  . Alcohol use: No  . Drug use: No  . Sexual activity: Not Currently  Other Topics Concern  . Not on file  Social History Narrative  .  Not on file   Social Determinants of Health   Financial Resource Strain: Medium Risk  . Difficulty of Paying Living Expenses: Somewhat hard  Food Insecurity: Food Insecurity Present  . Worried About Charity fundraiser in the Last Year: Sometimes true  . Ran Out of Food in the Last Year: Sometimes true  Transportation Needs: No Transportation Needs  . Lack of Transportation (Medical): No  . Lack of Transportation (Non-Medical): No  Physical Activity: Inactive   . Days of Exercise per Week: 0 days  . Minutes of Exercise per Session: 0 min  Stress: Stress Concern Present  . Feeling of Stress : To some extent  Social Connections: Moderately Isolated  . Frequency of Communication with Friends and Family: Twice a week  . Frequency of Social Gatherings with Friends and Family: Twice a week  . Attends Religious Services: More than 4 times per year  . Active Member of Clubs or Organizations: No  . Attends Archivist Meetings: Never  . Marital Status: Divorced     PHYSICAL EXAMINATION  ECOG PERFORMANCE STATUS: 0 - Asymptomatic  Vitals:   01/14/20 0946  BP: 108/75  Pulse: 74  Resp: 18  Temp: 97.7 F (36.5 C)  SpO2: 97%    GENERAL:alert, no distress, well nourished, well developed, comfortable, cooperative, smiling and accompanied by girlfriend SKIN: skin color, texture, turgor are normal, no rashes or significant lesions HEAD: Normocephalic, No masses, lesions, tenderness or abnormalities EYES: normal, Conjunctiva are pink and non-injected EARS: External ears normal OROPHARYNX: Not examined, mask in place NECK: supple LYMPH:  no palpable lymphadenopathy BREAST:not examined LUNGS: clear to auscultation and percussion HEART: regular rate & rhythm ABDOMEN:abdomen soft and normal bowel sounds BACK: Back symmetric, no curvature. EXTREMITIES:less then 2 second capillary refill, no joint deformities, effusion, or inflammation, no skin discoloration  NEURO: alert & oriented x 3 with fluent speech, gait normal   LABORATORY DATA: CBC    Component Value Date/Time   WBC 7.1 01/14/2020 1108   RBC 4.38 01/14/2020 1108   HGB 13.8 01/14/2020 1108   HCT 42.9 01/14/2020 1108   PLT 232 01/14/2020 1108   MCV 97.9 01/14/2020 1108   MCH 31.5 01/14/2020 1108   MCHC 32.2 01/14/2020 1108   RDW 13.2 01/14/2020 1108   LYMPHSABS 2.1 01/14/2020 1108   MONOABS 0.8 01/14/2020 1108   EOSABS 0.2 01/14/2020 1108   BASOSABS 0.1 01/14/2020 1108       Chemistry      Component Value Date/Time   NA 138 01/14/2020 1108   K 4.1 01/14/2020 1108   CL 102 01/14/2020 1108   CO2 27 01/14/2020 1108   BUN 20 01/14/2020 1108   CREATININE 0.84 01/14/2020 1108      Component Value Date/Time   CALCIUM 9.4 01/14/2020 1108   ALKPHOS 72 01/14/2020 1108   AST 15 01/14/2020 1108   ALT 10 01/14/2020 1108   BILITOT 1.0 01/14/2020 1108       RADIOGRAPHIC STUDIES:  MR LIVER W WO CONTRAST  Result Date: 01/10/2020 CLINICAL DATA:  Prostate carcinoma. Indeterminate liver lesion on recent outside CT. EXAM: MRI ABDOMEN WITHOUT AND WITH CONTRAST TECHNIQUE: Multiplanar multisequence MR imaging of the abdomen was performed both before and after the administration of intravenous contrast. CONTRAST:  41mL GADAVIST GADOBUTROL 1 MMOL/ML IV SOLN COMPARISON:  CT from [facility] on 12/17/2019 FINDINGS: Lower chest: No acute findings. Hepatobiliary: Image degradation by motion artifact noted. A tiny approximately 8 mm low-attenuation lesion is seen in the  posterior right hepatic lobe on image 26/8 which is too small to characterize. This does not show significant T2 hyperintensity to suggest a cyst, and is nonspecific. No restricted diffusion seen. No other liver lesions are identified. Gallbladder is unremarkable. No evidence of biliary ductal dilatation. Pancreas:  No mass or inflammatory changes. Spleen:  Within normal limits in size and appearance. Adrenals/Urinary Tract: No masses identified. No evidence of hydronephrosis. Stomach/Bowel: Visualized portion unremarkable. Vascular/Lymphatic: No pathologically enlarged lymph nodes identified. No abdominal aortic aneurysm. Other:  None. Musculoskeletal:  No suspicious bone lesions identified. IMPRESSION: 1. Image degradation by motion artifact noted. Tiny sub-cm lesion in the posterior right hepatic lobe is too small to characterize. Recommend continued follow-up by hepatic protocol abdomen CT without and with contrast in 6  months. 2. No other significant abnormality identified. Electronically Signed   By: Marlaine Hind M.D.   On: 01/10/2020 16:33     ASSESSMENT AND PLAN:  1. Prostate cancer metastatic to intraabdominal lymph node [facility]) Metastatic castration sensitive prostate cancer with low-volume disease with subcentimeter lesion in the posterior hepatic right lobe (too small to characterize on MRI imaging on 01/10/2020).  Recommendation is to repeat CT abdomen (hepatic protocol) in 06/2020.  Bone scan performed at [facility] on 12/18/2019 was negative for osseous involvement of disease.  Degarelix 240 mg will be initiated on 01/29/2020 followed by lupron every 6 months.  Given his hepatic lesion is sub-centimeter and we are unable to determine if this is metastatic disease, will give him the benefit of the doubt and start abiraterone + prednsione.  We will consider somatic mutation testing on the prostate biopsy down the line.  Interestingly, CT imaging at [facility] measuring hepatic lesion at 12 mm whereas our MRI described above measures hepatic abnormality at 8 mm.    Degarelix injection as planned on 01/29/2020.  Will ascertain baseline lab work today: CBC diff, CMET, PSA.  Documentation and imaging reviewed related to his cancer care, including documentation in [place] from [facility].  Rx for abiraterone 1000 mg daily (on empty stomach) + prednisone 5 mg daily sent to specialty pharmacy ([facility]).  Return in 2 weeks for follow-up as scheduled and to embark on ADT.  He will need a "chemotherapy teach" for ADT and abiraterone + prednisone.   ORDERS PLACED FOR THIS ENCOUNTER: Orders Placed This Encounter  Procedures  . DG Pelvis 1-2 Views  . CBC with Differential  . Comprehensive metabolic panel  . PSA    MEDICATIONS PRESCRIBED THIS ENCOUNTER: Meds ordered this encounter  Medications  . abiraterone acetate (ZYTIGA) 500 MG tablet    Sig: Take 2 tablets (1,000  mg total) by mouth daily. Take on an empty stomach 1 hour before or 2 hours after a meal.    Dispense:  60 tablet    Refill:  11    Order Specific Question:   Supervising Provider    Answer:   Derek Jack [[id number]]  . predniSONE (DELTASONE) 5 MG tablet    Sig: Take 1 tablet (5 mg total) by mouth daily with breakfast.    Dispense:  30 tablet    Refill:  11    Order Specific Question:   Supervising Provider    Answer:   Derek Jack [[id number]]    All questions were answered. The patient knows to call the clinic with any problems, questions or concerns. We can certainly see the patient much sooner if necessary.  Patient and plan discussed with Dr. Dirk Dress  Delton Coombes and he is in agreement with the aforementioned.   This note is electronically signed by: Robynn Pane, PA-C 01/14/2020 4:20 PM

## 2020-01-14 NOTE — Patient Instructions (Addendum)
[place] at [facility] Discharge Instructions  You were seen today by Kirby Crigler PA. He went over your recent test results. We can not definitively say that your prostate cancer has spread to your liver. The spot found in your liver has shrunk in size and is too small to biopsy, we will continue to monitor this area with scans and will biopsy if size increases. You will start your injections today to treat your prostate cancer. You may experience fatigue and tiredness from your treatment. You will also take a pill daily to control your cancer. He will see you back in 4 weeks for labs, injection and follow up.   Thank you for choosing [place] at [facility] to provide your oncology and hematology care.  To afford each patient quality time with our provider, please arrive at least 15 minutes before your scheduled appointment time.   If you have a lab appointment with the [facility] please come in thru the  Main Entrance and check in at the main information desk  You need to re-schedule your appointment should you arrive 10 or more minutes late.  We strive to give you quality time with our providers, and arriving late affects you and other patients whose appointments are after yours.  Also, if you no show three or more times for appointments you may be dismissed from the clinic at the providers discretion.     Again, thank you for choosing [facility].  Our hope is that these requests will decrease the amount of time that you wait before being seen by our physicians.       _____________________________________________________________  Should you have questions after your visit to [facility], please contact our office at [phone] between the hours of 8:00 a.m. and 4:30 p.m.  Voicemails left after 4:00 p.m. will not be returned until the following business day.  For prescription refill requests, have your pharmacy  contact our office and allow 72 hours.    Cancer Center Support Programs:   > Cancer Support Group  2nd Tuesday of the month 1pm-2pm, Journey Room

## 2020-01-15 ENCOUNTER — Telehealth (HOSPITAL_COMMUNITY): Payer: Self-pay | Admitting: Pharmacy Technician

## 2020-01-15 ENCOUNTER — Telehealth (HOSPITAL_COMMUNITY): Payer: Self-pay | Admitting: *Deleted

## 2020-01-15 ENCOUNTER — Telehealth (HOSPITAL_COMMUNITY): Payer: Self-pay | Admitting: Pharmacist

## 2020-01-15 DIAGNOSIS — C772 Secondary and unspecified malignant neoplasm of intra-abdominal lymph nodes: Secondary | ICD-10-CM

## 2020-01-15 DIAGNOSIS — C61 Malignant neoplasm of prostate: Secondary | ICD-10-CM

## 2020-01-15 NOTE — Telephone Encounter (Signed)
LMOM for pt to call us back tomorrow to go over his results.

## 2020-01-15 NOTE — Telephone Encounter (Signed)
Oral Oncology Patient Advocate Encounter  Received notification from [facility] that prior authorization for Abiraterone is required.  PA submitted on CoverMyMeds Key [id number]  Status is pending  Oral Oncology Clinic will continue to follow.  [place] Patient [place] Phone [phone] Fax [phone] 01/15/2020 11:02 AM

## 2020-01-15 NOTE — Telephone Encounter (Signed)
Oral Oncology Patient Advocate Encounter  Prior Authorization for Abiraterone has been approved.    PA# [id number] Effective dates: 01/15/20 through 01/14/21  Patient must use [facility] pharmacy.  Oral Oncology Clinic will continue to follow.   [place] Patient [place] Phone [phone] Fax [phone] 01/15/2020 2:56 PM

## 2020-01-15 NOTE — Telephone Encounter (Signed)
-----   Message from [facility], PA-C sent at 01/15/2020  4:03 PM EDT ----- PSA elevated as expected

## 2020-01-21 MED ORDER — ABIRATERONE ACETATE 250 MG PO TABS: 1000.0000 mg | ORAL_TABLET | Freq: Every day | ORAL | 11 refills | Status: DC

## 2020-01-21 MED ORDER — PREDNISONE 5 MG PO TABS: 5.0000 mg | ORAL_TABLET | Freq: Every day | ORAL | 11 refills | Status: DC

## 2020-01-21 NOTE — Telephone Encounter (Signed)
Oral Chemotherapy Pharmacist Encounter  Mr. Prashad knows his prescription had to be sent to [facility]. Provided him with the phone number to [place]. He also knows his prednisone was sent to his local pharmacy.  Patient Education I spoke with patient for overview of new oral chemotherapy medication: Zytiga (abiraterone) for the treatment of newly diagnosed metastatic castration sensitive prostate cancer in conjunction with prednisone, planned duration until disease progression or unacceptable drug toxicity.   Pt is doing well. Counseled patient on administration, dosing, side effects, monitoring, drug-food interactions, safe handling, storage, and disposal. Patient will take: Zytiga: Take 4 tablets (1,000 mg total) by mouth daily. Take on an empty stomach 1 hour before or 2 hours after a meal Prednisone: Take 1 tablet (5 mg total) by mouth daily with breakfast  Side effects include but not limited to: HTN, fatigue, hotflashes, decreased wbc.    Reviewed with patient importance of keeping a medication schedule and plan for any missed doses.  Mr. Iversen voiced understanding and appreciation. All questions answered. Medication handout placed in the mail.  Provided patient with Oral [facility] phone number. Patient knows to call the office with questions or concerns. Oral Chemotherapy Navigation Clinic will continue to follow.  Darl Pikes, PharmD, BCPS, BCOP, CPP Hematology/Oncology Clinical Pharmacist Practitioner [facility] [phone]  01/21/2020 2:54 PM

## 2020-01-21 NOTE — Telephone Encounter (Signed)
Oral Chemotherapy Pharmacist Encounter  Due to insurance restriction the medication could not be filled at [facility]. Prescription has been e-scribed to [facility].  Supportive information was faxed to [facility]. We will continue to follow medication access.   Attempted to call patient and let him know, no answer LVM for patient to call me back.  Additionally, insurance would not approve 500mg  tablets, prescription changed to the insurance approved 250mg  tablets.  Darl Pikes, PharmD, BCPS, [facility] Hematology/Oncology Clinical Pharmacist [facility] [place] [facility] [phone]  01/21/2020 10:21 AM

## 2020-01-21 NOTE — Telephone Encounter (Signed)
Oral Oncology Pharmacist Encounter  Received new prescription for Zytiga (abiraterone) for the treatment of newly diagnosed metastatic castration sensitive prostate cancer in conjunction with prednisone, planned duration until disease progression or unacceptable drug toxicity.  CMP from 01/14/20 assessed, no relevant lab abnormalities. Prescription dose and frequency assessed.   Current medication list in Epic reviewed, no DDIs with abiraterone identified.  Prescription has been e-scribed to the [facility] for benefits analysis and approval.  Oral Oncology Clinic will continue to follow for insurance authorization, copayment issues, initial counseling and start date.  Darl Pikes, PharmD, BCPS, BCOP, CPP Hematology/Oncology Clinical Pharmacist Practitioner [facility] [place] [facility] [phone]  01/21/2020 10:17 AM

## 2020-01-29 ENCOUNTER — Inpatient Hospital Stay (HOSPITAL_BASED_OUTPATIENT_CLINIC_OR_DEPARTMENT_OTHER): Payer: 59 | Admitting: Nurse Practitioner

## 2020-01-29 ENCOUNTER — Inpatient Hospital Stay (HOSPITAL_COMMUNITY): Payer: 59 | Attending: Hematology

## 2020-01-29 ENCOUNTER — Other Ambulatory Visit (HOSPITAL_COMMUNITY): Payer: 59

## 2020-01-29 ENCOUNTER — Other Ambulatory Visit: Payer: Self-pay

## 2020-01-29 DIAGNOSIS — C61 Malignant neoplasm of prostate: Secondary | ICD-10-CM

## 2020-01-29 DIAGNOSIS — Z79899 Other long term (current) drug therapy: Secondary | ICD-10-CM | POA: Insufficient documentation

## 2020-01-29 DIAGNOSIS — Z5111 Encounter for antineoplastic chemotherapy: Secondary | ICD-10-CM | POA: Insufficient documentation

## 2020-01-29 DIAGNOSIS — Z807 Family history of other malignant neoplasms of lymphoid, hematopoietic and related tissues: Secondary | ICD-10-CM | POA: Insufficient documentation

## 2020-01-29 DIAGNOSIS — Z8042 Family history of malignant neoplasm of prostate: Secondary | ICD-10-CM | POA: Diagnosis not present

## 2020-01-29 DIAGNOSIS — Z809 Family history of malignant neoplasm, unspecified: Secondary | ICD-10-CM | POA: Insufficient documentation

## 2020-01-29 DIAGNOSIS — C772 Secondary and unspecified malignant neoplasm of intra-abdominal lymph nodes: Secondary | ICD-10-CM | POA: Insufficient documentation

## 2020-01-29 DIAGNOSIS — Z7952 Long term (current) use of systemic steroids: Secondary | ICD-10-CM | POA: Diagnosis not present

## 2020-01-29 MED ORDER — DEGARELIX ACETATE(240 MG DOSE) 120 MG/VIAL ~~LOC~~ SOLR
240.0000 mg | Freq: Once | SUBCUTANEOUS | Status: AC
  Administered 2020-01-29: 240 mg via SUBCUTANEOUS
  Filled 2020-01-29: qty 6

## 2020-01-29 NOTE — Assessment & Plan Note (Addendum)
1.  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  Serum testosterone on 12/17/2019 of 197. -Bone scan on 12/18/2019 at [facility] shows small focal of uptake seen left inferior orbit favoring benign etiology.  No suspicious foci of bone metastasis. -CTAP with contrast on 12/18/2019 showed retroperitoneal and bilateral iliac chain metastatic adenopathy.  Left para-aortic lymph node measuring 15 mm.  Right external iliac lymph node measuring 22 x 33 mm.  Left external iliac lymph node measuring 26 x 35 mm.  Ill-defined hypoenhancing 12 mm hepatic lesion, indeterminate, however concerning for metastasis.  Sclerotic focus in the right posterior ilium, indeterminate. -MRI of the liver on 01/10/2020 showed tiny subcentimeter lesion in the posterior right hepatic lobe too small to characterize.  Suggested follow-up in 6 months. -He will get Darzalex 240 mg subcu today on 01/29/2020.  He will get Lupron 45 mg intramuscularly in 1 month.  Followed by every 6 months. -He will start abiraterone and prednisone on 02/03/2020.  He will take 500mg  of abiraterone for 5 days then increase to 1000 mg. -He will follow-up 2 weeks after starting the medication for lab check.  2.  Left hip pain: -Patient reports he had left hip pain for the past 2 months.  Pain is present when he walks and when he tries to get in and out of the car. -Patient does have some knee pain on the right side from arthritis.  He thinks it might be compensating more on the left side.  Bone scan did not show any lesions from the left hip. -We will keep a close eye on it. -We will check his labs every 2 weeks for the first 2 months.  We will monitor his blood pressure, LFTs, and potassium.

## 2020-01-29 NOTE — Progress Notes (Signed)
Randall Dawson [address] [place] [address]   CLINIC:  Medical Oncology/Hematology  PCP:  Glenda Chroman, MD [address] [place] [address] [phone]   REASON FOR VISIT: Follow-up for prostate cancer   CURRENT THERAPY: Zytiga and prednisone   INTERVAL HISTORY:  Randall [place] 59 y.o. male returns for routine follow-up for prostate cancer.  Patient reports he just received a shipment of his Zytiga today.  He reports he is still having hip pain otherwise he is feeling fine.  He has no new issues at this time. Denies any nausea, vomiting, or diarrhea. Denies any new pains. Had not noticed any recent bleeding such as epistaxis, hematuria or hematochezia. Denies recent chest pain on exertion, shortness of breath on minimal exertion, pre-syncopal episodes, or palpitations. Denies any numbness or tingling in hands or feet. Denies any recent fevers, infections, or recent hospitalizations. Patient reports appetite at 75% and energy level at 50%.     REVIEW OF SYSTEMS:  Review of Systems  Gastrointestinal: Positive for constipation.  Neurological: Positive for dizziness and headaches.  Psychiatric/Behavioral: Positive for sleep disturbance.  All other systems reviewed and are negative.    PAST MEDICAL/SURGICAL HISTORY:  Past Medical History:  Diagnosis Date   History of hepatitis B 01/14/2020   Prostate cancer [facility])    Past Surgical History:  Procedure Laterality Date   FOREIGN BODY REMOVAL N/A 07/13/2017   Procedure: FOREIGN BODY REMOVAL ADULT;  Surgeon: Helayne Seminole, MD;  Location: [place];  Service: ENT;  Laterality: N/A;   LEG SURGERY     rods in leg from fracture   RIGID ESOPHAGOSCOPY N/A 07/13/2017   Procedure: RIGID ESOPHAGOSCOPY;  Surgeon: Helayne Seminole, MD;  Location: [place];  Service: ENT;  Laterality: N/A;     SOCIAL HISTORY:  Social History   Socioeconomic History   Marital status: Divorced    Spouse name: Not on file    Number of children: 0   Years of education: Not on file   Highest education level: Not on file  Occupational History   Occupation: EMPLOYED  Tobacco Use   Smoking status: Never Smoker   Smokeless tobacco: Never Used  Vaping Use   Vaping Use: Never used  Substance and Sexual Activity   Alcohol use: No   Drug use: No   Sexual activity: Not Currently  Other Topics Concern   Not on file  Social History Narrative   Not on file   Social Determinants of Health   Financial Resource Strain: Medium Risk   Difficulty of Paying Living Expenses: Somewhat hard  Food Insecurity: Food Insecurity Present   Worried About Charity fundraiser in the Last Year: Sometimes true   Ran Out of Food in the Last Year: Sometimes true  Transportation Needs: No Transportation Needs   Lack of Transportation (Medical): No   Lack of Transportation (Non-Medical): No  Physical Activity: Inactive   Days of Exercise per Week: 0 days   Minutes of Exercise per Session: 0 min  Stress: Stress Concern Present   Feeling of Stress : To some extent  Social Connections: Moderately Isolated   Frequency of Communication with Friends and Family: Twice a week   Frequency of Social Gatherings with Friends and Family: Twice a week   Attends Religious Services: More than 4 times per year   Active Member of [employer] or Organizations: No   Attends Archivist Meetings: Never   Marital Status: Divorced  Intimate Partner Violence: Not At Risk   Fear of Current or Ex-Partner: No   Emotionally Abused: No   Physically Abused: No   Sexually Abused: No    FAMILY HISTORY:  Family History  Problem Relation Age of Onset   Dementia Mother    Prostate cancer Father    Cancer Sister    Lymphoma Brother     CURRENT MEDICATIONS:  Outpatient Encounter Medications as of 01/29/2020  Medication Sig   abiraterone acetate (ZYTIGA) 250 MG tablet Take 4 tablets (1,000 mg total) by mouth daily.  Take on an empty stomach 1 hour before or 2 hours after a meal   predniSONE (DELTASONE) 5 MG tablet Take 1 tablet (5 mg total) by mouth daily with breakfast.   No facility-administered encounter medications on file as of 01/29/2020.    ALLERGIES:  No Known Allergies   PHYSICAL EXAM:  ECOG Performance status: 1  Vitals:   01/29/20 1443  BP: 127/82  Pulse: 81  Resp: 18  Temp: 97.7 F (36.5 C)  SpO2: 96%   Filed Weights   01/29/20 1443  Weight: 224 lb 3.2 oz (101.7 kg)   Physical Exam Constitutional:      Appearance: Normal appearance. He is normal weight.  Cardiovascular:     Rate and Rhythm: Normal rate and regular rhythm.     Heart sounds: Normal heart sounds.  Pulmonary:     Breath sounds: Normal breath sounds.  Abdominal:     General: Bowel sounds are normal.     Palpations: Abdomen is soft.  Musculoskeletal:        General: Normal range of motion.  Skin:    General: Skin is warm.  Neurological:     Mental Status: He is alert and oriented to person, place, and time. Mental status is at baseline.  Psychiatric:        Mood and Affect: Mood normal.        Behavior: Behavior normal.        Thought Content: Thought content normal.        Judgment: Judgment normal.      LABORATORY DATA:  I have reviewed the labs as listed.  CBC    Component Value Date/Time   WBC 7.1 01/14/2020 1108   RBC 4.38 01/14/2020 1108   HGB 13.8 01/14/2020 1108   HCT 42.9 01/14/2020 1108   PLT 232 01/14/2020 1108   MCV 97.9 01/14/2020 1108   MCH 31.5 01/14/2020 1108   MCHC 32.2 01/14/2020 1108   RDW 13.2 01/14/2020 1108   LYMPHSABS 2.1 01/14/2020 1108   MONOABS 0.8 01/14/2020 1108   EOSABS 0.2 01/14/2020 1108   BASOSABS 0.1 01/14/2020 1108   CMP Latest Ref Rng & Units 01/14/2020  Glucose 70 - 99 mg/dL 94  BUN 6 - 20 mg/dL 20  Creatinine 0.61 - 1.24 mg/dL 0.84  Sodium 135 - 145 mmol/L 138  Potassium 3.5 - 5.1 mmol/L 4.1  Chloride 98 - 111 mmol/L 102  CO2 22 - 32 mmol/L 27   Calcium 8.9 - 10.3 mg/dL 9.4  Total Protein 6.5 - 8.1 g/dL 7.7  Total Bilirubin 0.3 - 1.2 mg/dL 1.0  Alkaline Phos 38 - 126 U/L 72  AST 15 - 41 U/L 15  ALT 0 - 44 U/L 10    All questions were answered to patient's stated satisfaction. Encouraged patient to call with any new concerns or questions before his next visit to the cancer center and we can certain see him sooner,  if needed.     ASSESSMENT & PLAN:  Prostate cancer metastatic to intraabdominal lymph node ([place]) 1.  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  Serum testosterone on 12/17/2019 of 197. -Bone scan on 12/18/2019 at [facility] shows small focal of uptake seen left inferior orbit favoring benign etiology.  No suspicious foci of bone metastasis. -CTAP with contrast on 12/18/2019 showed retroperitoneal and bilateral iliac chain metastatic adenopathy.  Left para-aortic lymph node measuring 15 mm.  Right external iliac lymph node measuring 22 x 33 mm.  Left external iliac lymph node measuring 26 x 35 mm.  Ill-defined hypoenhancing 12 mm hepatic lesion, indeterminate, however concerning for metastasis.  Sclerotic focus in the right posterior ilium, indeterminate. -MRI of the liver on 01/10/2020 showed tiny subcentimeter lesion in the posterior right hepatic lobe too small to characterize.  Suggested follow-up in 6 months. -He will get Darzalex 240 mg subcu today on 01/29/2020.  He will get Lupron 45 mg intramuscularly in 1 month.  Followed by every 6 months. -He will start abiraterone and prednisone on 02/03/2020.  He will take 500mg  of abiraterone for 5 days then increase to 1000 mg. -He will follow-up 2 weeks after starting the medication for lab check.  2.  Left hip pain: -Patient reports he had left hip pain for the past 2 months.  Pain is present when he walks and when he tries to get in and out of the  car. -Patient does have some knee pain on the right side from arthritis.  He thinks it might be compensating more on the left side.  Bone scan did not show any lesions from the left hip. -We will keep a close eye on it. -We will check his labs every 2 weeks for the first 2 months.  We will monitor his blood pressure, LFTs, and potassium.     Orders placed this encounter:  Orders Placed This Encounter  Procedures   Lactate dehydrogenase   CBC with Differential/Platelet   Comprehensive metabolic panel   PSA      Francene Finders, FNP-C [place] [phone]

## 2020-01-29 NOTE — Telephone Encounter (Signed)
Patient received medication this morning, 01/29/20, from [facility].  [place] Patient [place] Phone [phone] Fax [phone] 01/29/2020 10:45 AM

## 2020-02-17 ENCOUNTER — Other Ambulatory Visit: Payer: Self-pay

## 2020-02-17 ENCOUNTER — Inpatient Hospital Stay (HOSPITAL_BASED_OUTPATIENT_CLINIC_OR_DEPARTMENT_OTHER): Payer: 59 | Admitting: Hematology

## 2020-02-17 ENCOUNTER — Inpatient Hospital Stay (HOSPITAL_COMMUNITY): Payer: 59

## 2020-02-17 VITALS — BP 114/73 | HR 64 | Temp 97.4°F | Resp 18 | Wt 225.7 lb

## 2020-02-17 DIAGNOSIS — C772 Secondary and unspecified malignant neoplasm of intra-abdominal lymph nodes: Secondary | ICD-10-CM | POA: Diagnosis not present

## 2020-02-17 DIAGNOSIS — Z5111 Encounter for antineoplastic chemotherapy: Secondary | ICD-10-CM | POA: Diagnosis not present

## 2020-02-17 DIAGNOSIS — C61 Malignant neoplasm of prostate: Secondary | ICD-10-CM

## 2020-02-17 LAB — CBC WITH DIFFERENTIAL/PLATELET
Abs Immature Granulocytes: 0.07 10*3/uL (ref 0.00–0.07)
Basophils Absolute: 0 10*3/uL (ref 0.0–0.1)
Basophils Relative: 0 %
Eosinophils Absolute: 0.1 10*3/uL (ref 0.0–0.5)
Eosinophils Relative: 1 %
HCT: 41.1 % (ref 39.0–52.0)
Hemoglobin: 13.6 g/dL (ref 13.0–17.0)
Immature Granulocytes: 1 %
Lymphocytes Relative: 16 %
Lymphs Abs: 1.2 10*3/uL (ref 0.7–4.0)
MCH: 31.9 pg (ref 26.0–34.0)
MCHC: 33.1 g/dL (ref 30.0–36.0)
MCV: 96.5 fL (ref 80.0–100.0)
Monocytes Absolute: 0.6 10*3/uL (ref 0.1–1.0)
Monocytes Relative: 8 %
Neutro Abs: 5.7 10*3/uL (ref 1.7–7.7)
Neutrophils Relative %: 74 %
Platelets: 277 10*3/uL (ref 150–400)
RBC: 4.26 MIL/uL (ref 4.22–5.81)
RDW: 13.2 % (ref 11.5–15.5)
WBC: 7.7 10*3/uL (ref 4.0–10.5)
nRBC: 0 % (ref 0.0–0.2)

## 2020-02-17 LAB — COMPREHENSIVE METABOLIC PANEL
ALT: 12 U/L (ref 0–44)
AST: 16 U/L (ref 15–41)
Albumin: 4.3 g/dL (ref 3.5–5.0)
Alkaline Phosphatase: 82 U/L (ref 38–126)
Anion gap: 9 (ref 5–15)
BUN: 16 mg/dL (ref 6–20)
CO2: 26 mmol/L (ref 22–32)
Calcium: 9.7 mg/dL (ref 8.9–10.3)
Chloride: 103 mmol/L (ref 98–111)
Creatinine, Ser: 0.82 mg/dL (ref 0.61–1.24)
GFR calc Af Amer: >60 mL/min (ref >60)
GFR calc non Af Amer: >60 mL/min (ref >60)
Glucose, Bld: 110 mg/dL — ABNORMAL HIGH (ref 70–99)
Potassium: 4.1 mmol/L (ref 3.5–5.1)
Sodium: 138 mmol/L (ref 135–145)
Total Bilirubin: 0.7 mg/dL (ref 0.3–1.2)
Total Protein: 7.6 g/dL (ref 6.5–8.1)

## 2020-02-17 LAB — LACTATE DEHYDROGENASE: LDH: 145 U/L (ref 98–192)

## 2020-02-17 LAB — PSA: Prostatic Specific Antigen: 13.4 ng/mL — ABNORMAL HIGH (ref 0.00–4.00)

## 2020-02-17 NOTE — Progress Notes (Signed)
[place] [address] [place] [address]   CLINIC:  Medical Oncology/Hematology  PCP:  Glenda Chroman, MD [address] [place] [address]  [phone]  REASON FOR VISIT:  Follow-up for prostate cancer   PRIOR THERAPY: N/A   CURRENT THERAPY: Zytiga and prednisone   INTERVAL HISTORY:  [name], a 59 y.o. male, returns for routine follow-up for his prostate cancer. Randall Dawson was last seen on 01/29/2020 with Francene Finders, NP-C.  He notes that he increased his abiraterone acetate dosage to 4 pills on 02/08/2020. He continues to take his prednisone as prescribed. He has noticed some hot flashes, generally every day mostly during the day. He states that he is able to manage the hot flash symptoms without any major complaints     REVIEW OF SYSTEMS:  Review of Systems  Constitutional: Positive for appetite change (mildly decreased) and fatigue (moderate).  HENT:  Negative.   Eyes: Negative.   Respiratory: Negative.   Cardiovascular: Negative.   Gastrointestinal: Positive for constipation and nausea (for 2 days when increasing chemo pill).  Endocrine: Negative.   Genitourinary: Negative.    Musculoskeletal: Negative.   Skin: Negative.   Neurological: Positive for dizziness and headaches.  Hematological: Negative.   Psychiatric/Behavioral: Negative.   All other systems reviewed and are negative.   PAST MEDICAL/SURGICAL HISTORY:  Past Medical History:  Diagnosis Date  . History of hepatitis B 01/14/2020  . Prostate cancer [facility])    Past Surgical History:  Procedure Laterality Date  . FOREIGN BODY REMOVAL N/A 07/13/2017   Procedure: FOREIGN BODY REMOVAL ADULT;  Surgeon: Helayne Seminole, MD;  Location: [place];  Service: ENT;  Laterality: N/A;  . LEG SURGERY     rods in leg from fracture  . RIGID ESOPHAGOSCOPY N/A 07/13/2017   Procedure: RIGID ESOPHAGOSCOPY;  Surgeon: Helayne Seminole, MD;  Location: [facility] OR;  Service: ENT;   Laterality: N/A;    SOCIAL HISTORY:  Social History   Socioeconomic History  . Marital status: Divorced    Spouse name: Not on file  . Number of children: 0  . Years of education: Not on file  . Highest education level: Not on file  Occupational History  . Occupation: EMPLOYED  Tobacco Use  . Smoking status: Never Smoker  . Smokeless tobacco: Never Used  Vaping Use  . Vaping Use: Never used  Substance and Sexual Activity  . Alcohol use: No  . Drug use: No  . Sexual activity: Not Currently  Other Topics Concern  . Not on file  Social History Narrative  . Not on file   Social Determinants of Health   Financial Resource Strain: Medium Risk  . Difficulty of Paying Living Expenses: Somewhat hard  Food Insecurity: Food Insecurity Present  . Worried About Charity fundraiser in the Last Year: Sometimes true  . Ran Out of Food in the Last Year: Sometimes true  Transportation Needs: No Transportation Needs  . Lack of Transportation (Medical): No  . Lack of Transportation (Non-Medical): No  Physical Activity: Inactive  . Days of Exercise per Week: 0 days  . Minutes of Exercise per Session: 0 min  Stress: Stress Concern Present  . Feeling of Stress : To some extent  Social Connections: Moderately Isolated  . Frequency of Communication with Friends and Family: Twice a week  . Frequency of Social Gatherings with Friends and Family: Twice a week  . Attends Religious Services: More than  4 times per year  . Active Member of Clubs or Organizations: No  . Attends Archivist Meetings: Never  . Marital Status: Divorced  Human resources officer Violence: Not At Risk  . Fear of Current or Ex-Partner: No  . Emotionally Abused: No  . Physically Abused: No  . Sexually Abused: No    FAMILY HISTORY:  Family History  Problem Relation Age of Onset  . Dementia Mother   . Prostate cancer Father   . Cancer Sister   . Lymphoma Brother     CURRENT MEDICATIONS:  Current Outpatient  Medications  Medication Sig Dispense Refill  . abiraterone acetate (ZYTIGA) 250 MG tablet Take 4 tablets (1,000 mg total) by mouth daily. Take on an empty stomach 1 hour before or 2 hours after a meal 120 tablet 11  . predniSONE (DELTASONE) 5 MG tablet Take 1 tablet (5 mg total) by mouth daily with breakfast. 30 tablet 11   No current facility-administered medications for this visit.    ALLERGIES:  No Known Allergies  PHYSICAL EXAM:  Performance status (ECOG): 0 - Asymptomatic  Vitals:   02/17/20 1302  BP: 114/73  Pulse: 64  Resp: 18  Temp: (!) 97.4 F (36.3 C)  SpO2: 98%   Wt Readings from Last 3 Encounters:  02/17/20 (!) 225 lb 11.2 oz (102.4 kg)  01/29/20 224 lb 3.2 oz (101.7 kg)  01/14/20 222 lb 4.8 oz (100.8 kg)   Physical Exam Vitals reviewed.  Constitutional:      Appearance: Normal appearance.  Cardiovascular:     Rate and Rhythm: Normal rate and regular rhythm.     Pulses: Normal pulses.     Heart sounds: Normal heart sounds.  Pulmonary:     Effort: Pulmonary effort is normal.     Breath sounds: Normal breath sounds.  Neurological:     General: No focal deficit present.     Mental Status: He is alert and oriented to person, place, and time.  Psychiatric:        Mood and Affect: Mood normal.        Behavior: Behavior normal.     LABORATORY DATA:  I have reviewed the labs as listed.  CBC Latest Ref Rng & Units 02/17/2020 01/14/2020  WBC 4.0 - 10.5 K/uL 7.7 7.1  Hemoglobin 13.0 - 17.0 g/dL 13.6 13.8  Hematocrit 39 - 52 % 41.1 42.9  Platelets 150 - 400 K/uL 277 232   CMP Latest Ref Rng & Units 02/17/2020 01/14/2020  Glucose 70 - 99 mg/dL 110(H) 94  BUN 6 - 20 mg/dL 16 20  Creatinine 0.61 - 1.24 mg/dL 0.82 0.84  Sodium 135 - 145 mmol/L 138 138  Potassium 3.5 - 5.1 mmol/L 4.1 4.1  Chloride 98 - 111 mmol/L 103 102  CO2 22 - 32 mmol/L 26 27  Calcium 8.9 - 10.3 mg/dL 9.7 9.4  Total Protein 6.5 - 8.1 g/dL 7.6 7.7  Total Bilirubin 0.3 - 1.2 mg/dL 0.7 1.0   Alkaline Phos 38 - 126 U/L 82 72  AST 15 - 41 U/L 16 15  ALT 0 - 44 U/L 12 10      Component Value Date/Time   RBC 4.26 02/17/2020 1147   MCV 96.5 02/17/2020 1147   MCH 31.9 02/17/2020 1147   MCHC 33.1 02/17/2020 1147   RDW 13.2 02/17/2020 1147   LYMPHSABS 1.2 02/17/2020 1147   MONOABS 0.6 02/17/2020 1147   EOSABS 0.1 02/17/2020 1147   BASOSABS 0.0 02/17/2020 1147  DIAGNOSTIC IMAGING:  I have independently reviewed the scans and discussed with the patient. No results found.   ASSESSMENT:  1.  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  Serum testosterone on 12/17/2019 of 197. -B[facility] scan on 12/18/2019 at [facility] shows small focus of uptake seen left inferior orbit favoring benign etiology.  No suspicious foci of bone meta stasis. -CTAP with contrast on 12/18/2019 showed retroperitoneal and bilateral iliac chain metastatic adenopathy.  Left para-aortic lymph node measures 15 mm.  Right external iliac lymph node measures 22 x 35 mm.  Left external iliac lymph node measures 26 x 35 mm.  Ill-defined hypoenhancing 12 mm hepatic lesion, indeterminate, however concerning for metastasis.  Sclerotic focus in the right posterior ilium, indeterminate. -MRI of the abdomen on 01/10/2020 shows tiny 8 mm low-attenuation lesion in the posterior right hepatic lobe, too small to characterize.  No other significant abnormalities. -Abiraterone and prednisone started around 02/01/2020.  Degarelix started on 01/29/2020.  2.  Family history: -Father died of prostate cancer at age 21.  Sister had cancer on her leg resected.  Paternal uncle had cancer, patient does not know the type.   PLAN:  1.  Metastatic castration sensitive prostate cancer: -Zytiga and prednisone started around 02/01/2020. -Increase the dose of Zytiga to 1000 mg about a week ago.  He so far he has tolerated it  very well.  He had 1 day of nausea and 2 days of migraines which subsided. -Some minor hot flashes during the daytime reported. -Reviewed LFTs which are within normal limits.  Potassium was normal.  Blood pressure is 114/73. -We will give him a Lupron 45 mg in the first week of August.  I plan to see him back in 2 weeks for follow-up with repeat labs.  2.  Family history: -I have recommended genetic testing.  3.  Left hip pain: -He uses left leg mostly because of right knee arthritis.  Mild improvement in the left hip pain reported.  Orders placed this encounter:  No orders of the defined types were placed in this encounter.    Derek Jack, MD [facility] [phone]   I, Jacqualyn Posey, am acting as a scribe for Dr. Sanda Linger.  I, Derek Jack MD, have reviewed the above documentation for accuracy and completeness, and I agree with the above.

## 2020-02-17 NOTE — Patient Instructions (Addendum)
[place] at [facility] Discharge Instructions  You were seen today by Dr. Delton Coombes. He went over your recent results. Continue taking your medications as prescribed. Dr. Delton Coombes will see you back in for labs and follow up.   Thank you for choosing [place] at [facility] to provide your oncology and hematology care.  To afford each patient quality time with our provider, please arrive at least 15 minutes before your scheduled appointment time.   If you have a lab appointment with the [facility] please come in thru the Main Entrance and check in at the main information desk  You need to re-schedule your appointment should you arrive 10 or more minutes late.  We strive to give you quality time with our providers, and arriving late affects you and other patients whose appointments are after yours.  Also, if you no show three or more times for appointments you may be dismissed from the clinic at the providers discretion.     Again, thank you for choosing [facility].  Our hope is that these requests will decrease the amount of time that you wait before being seen by our physicians.       _____________________________________________________________  Should you have questions after your visit to [facility], please contact our office at [phone] between the hours of 8:00 a.m. and 4:30 p.m.  Voicemails left after 4:00 p.m. will not be returned until the following business day.  For prescription refill requests, have your pharmacy contact our office and allow 72 hours.    Cancer Center Support Programs:   > Cancer Support Group  2nd Tuesday of the month 1pm-2pm, Journey Room

## 2020-02-26 ENCOUNTER — Other Ambulatory Visit: Payer: Self-pay

## 2020-02-26 ENCOUNTER — Inpatient Hospital Stay (HOSPITAL_COMMUNITY): Payer: 59 | Attending: Hematology

## 2020-02-26 VITALS — BP 124/75 | HR 84 | Temp 97.5°F | Resp 18

## 2020-02-26 DIAGNOSIS — M25552 Pain in left hip: Secondary | ICD-10-CM | POA: Insufficient documentation

## 2020-02-26 DIAGNOSIS — Z79818 Long term (current) use of other agents affecting estrogen receptors and estrogen levels: Secondary | ICD-10-CM | POA: Insufficient documentation

## 2020-02-26 DIAGNOSIS — C61 Malignant neoplasm of prostate: Secondary | ICD-10-CM | POA: Insufficient documentation

## 2020-02-26 DIAGNOSIS — Z8042 Family history of malignant neoplasm of prostate: Secondary | ICD-10-CM | POA: Diagnosis not present

## 2020-02-26 DIAGNOSIS — Z809 Family history of malignant neoplasm, unspecified: Secondary | ICD-10-CM | POA: Diagnosis not present

## 2020-02-26 DIAGNOSIS — R232 Flushing: Secondary | ICD-10-CM | POA: Insufficient documentation

## 2020-02-26 DIAGNOSIS — C772 Secondary and unspecified malignant neoplasm of intra-abdominal lymph nodes: Secondary | ICD-10-CM | POA: Diagnosis not present

## 2020-02-26 DIAGNOSIS — Z191 Hormone sensitive malignancy status: Secondary | ICD-10-CM | POA: Insufficient documentation

## 2020-02-26 MED ORDER — LEUPROLIDE ACETATE (6 MONTH) 45 MG ~~LOC~~ KIT
45.0000 mg | PACK | Freq: Once | SUBCUTANEOUS | Status: AC
  Administered 2020-02-26: 45 mg via SUBCUTANEOUS
  Filled 2020-02-26: qty 45

## 2020-02-26 NOTE — Progress Notes (Signed)
Pt here today for Eligard injection. Pt given injection in right arm with no complaints. Pt tolerated injection well. Pt stable and discharged home ambulatory. Pt to return as scheduled.

## 2020-03-03 ENCOUNTER — Inpatient Hospital Stay (HOSPITAL_BASED_OUTPATIENT_CLINIC_OR_DEPARTMENT_OTHER): Payer: 59 | Admitting: Hematology

## 2020-03-03 ENCOUNTER — Inpatient Hospital Stay (HOSPITAL_COMMUNITY): Payer: 59

## 2020-03-03 ENCOUNTER — Other Ambulatory Visit: Payer: Self-pay

## 2020-03-03 VITALS — BP 125/84 | HR 74 | Temp 97.7°F | Resp 20 | Wt 226.6 lb

## 2020-03-03 DIAGNOSIS — C772 Secondary and unspecified malignant neoplasm of intra-abdominal lymph nodes: Secondary | ICD-10-CM | POA: Diagnosis not present

## 2020-03-03 DIAGNOSIS — C61 Malignant neoplasm of prostate: Secondary | ICD-10-CM

## 2020-03-03 LAB — CBC WITH DIFFERENTIAL/PLATELET
Abs Immature Granulocytes: 0.04 10*3/uL (ref 0.00–0.07)
Basophils Absolute: 0 10*3/uL (ref 0.0–0.1)
Basophils Relative: 1 %
Eosinophils Absolute: 0.1 10*3/uL (ref 0.0–0.5)
Eosinophils Relative: 1 %
HCT: 40.1 % (ref 39.0–52.0)
Hemoglobin: 13.1 g/dL (ref 13.0–17.0)
Immature Granulocytes: 1 %
Lymphocytes Relative: 18 %
Lymphs Abs: 1.5 10*3/uL (ref 0.7–4.0)
MCH: 31.8 pg (ref 26.0–34.0)
MCHC: 32.7 g/dL (ref 30.0–36.0)
MCV: 97.3 fL (ref 80.0–100.0)
Monocytes Absolute: 0.5 10*3/uL (ref 0.1–1.0)
Monocytes Relative: 6 %
Neutro Abs: 6.2 10*3/uL (ref 1.7–7.7)
Neutrophils Relative %: 73 %
Platelets: 242 10*3/uL (ref 150–400)
RBC: 4.12 MIL/uL — ABNORMAL LOW (ref 4.22–5.81)
RDW: 13.2 % (ref 11.5–15.5)
WBC: 8.3 10*3/uL (ref 4.0–10.5)
nRBC: 0 % (ref 0.0–0.2)

## 2020-03-03 LAB — COMPREHENSIVE METABOLIC PANEL
ALT: 31 U/L (ref 0–44)
AST: 36 U/L (ref 15–41)
Albumin: 4.1 g/dL (ref 3.5–5.0)
Alkaline Phosphatase: 72 U/L (ref 38–126)
Anion gap: 8 (ref 5–15)
BUN: 15 mg/dL (ref 6–20)
CO2: 24 mmol/L (ref 22–32)
Calcium: 9.4 mg/dL (ref 8.9–10.3)
Chloride: 105 mmol/L (ref 98–111)
Creatinine, Ser: 0.79 mg/dL (ref 0.61–1.24)
GFR calc Af Amer: >60 mL/min (ref >60)
GFR calc non Af Amer: >60 mL/min (ref >60)
Glucose, Bld: 110 mg/dL — ABNORMAL HIGH (ref 70–99)
Potassium: 3.8 mmol/L (ref 3.5–5.1)
Sodium: 137 mmol/L (ref 135–145)
Total Bilirubin: 0.7 mg/dL (ref 0.3–1.2)
Total Protein: 7.3 g/dL (ref 6.5–8.1)

## 2020-03-03 LAB — MAGNESIUM: Magnesium: 2.2 mg/dL (ref 1.7–2.4)

## 2020-03-03 NOTE — Progress Notes (Signed)
[place] [address]  [address]   CLINIC:  Medical Oncology/Hematology  PCP:  Glenda Chroman, MD [address] [place] [address] [phone]   REASON FOR VISIT:  Follow-up for prostate cancer  PRIOR THERAPY: None  NGS Results: Not done  CURRENT THERAPY: Zytiga and prednisone  BRIEF ONCOLOGIC HISTORY:  Oncology History   No history exists.    CANCER STAGING: Cancer Staging No matching staging information was found for the patient.  INTERVAL HISTORY:  [name], a 59 y.o. male, returns for routine follow-up of his prostate cancer. Randall Dawson was last seen on 02/17/2020.  Today he reports having a lot of hot flashes, during the day and night, worse during the day, appearing about 6 times a day. He denies having any new pains.  He has not met with a geneticist yet.   REVIEW OF SYSTEMS:  Review of Systems  Constitutional: Positive for appetite change (mildly decreased) and fatigue (moderate).  Endocrine: Positive for hot flashes (multiple episodes day & night).  Genitourinary: Positive for frequency.   Musculoskeletal: Positive for arthralgias (6/10 joints pain).  Neurological: Positive for dizziness.  Psychiatric/Behavioral: Positive for depression and sleep disturbance. The patient is nervous/anxious.   All other systems reviewed and are negative.   PAST MEDICAL/SURGICAL HISTORY:  Past Medical History:  Diagnosis Date  . History of hepatitis B 01/14/2020  . Prostate cancer [facility])    Past Surgical History:  Procedure Laterality Date  . FOREIGN BODY REMOVAL N/A 07/13/2017   Procedure: FOREIGN BODY REMOVAL ADULT;  Surgeon: Helayne Seminole, MD;  Location: [place];  Service: ENT;  Laterality: N/A;  . LEG SURGERY     rods in leg from fracture  . RIGID ESOPHAGOSCOPY N/A 07/13/2017   Procedure: RIGID ESOPHAGOSCOPY;  Surgeon: Helayne Seminole, MD;  Location: [facility] OR;  Service: ENT;  Laterality: N/A;    SOCIAL  HISTORY:  Social History   Socioeconomic History  . Marital status: Divorced    Spouse name: Not on file  . Number of children: 0  . Years of education: Not on file  . Highest education level: Not on file  Occupational History  . Occupation: EMPLOYED  Tobacco Use  . Smoking status: Never Smoker  . Smokeless tobacco: Never Used  Vaping Use  . Vaping Use: Never used  Substance and Sexual Activity  . Alcohol use: No  . Drug use: No  . Sexual activity: Not Currently  Other Topics Concern  . Not on file  Social History Narrative  . Not on file   Social Determinants of Health   Financial Resource Strain: Medium Risk  . Difficulty of Paying Living Expenses: Somewhat hard  Food Insecurity: Food Insecurity Present  . Worried About Charity fundraiser in the Last Year: Sometimes true  . Ran Out of Food in the Last Year: Sometimes true  Transportation Needs: No Transportation Needs  . Lack of Transportation (Medical): No  . Lack of Transportation (Non-Medical): No  Physical Activity: Inactive  . Days of Exercise per Week: 0 days  . Minutes of Exercise per Session: 0 min  Stress: Stress Concern Present  . Feeling of Stress : To some extent  Social Connections: Moderately Isolated  . Frequency of Communication with Friends and Family: Twice a week  . Frequency of Social Gatherings with Friends and Family: Twice a week  . Attends Religious Services: More than 4 times per year  . Active Member  of Clubs or Organizations: No  . Attends Archivist Meetings: Never  . Marital Status: Divorced  Human resources officer Violence: Not At Risk  . Fear of Current or Ex-Partner: No  . Emotionally Abused: No  . Physically Abused: No  . Sexually Abused: No    FAMILY HISTORY:  Family History  Problem Relation Age of Onset  . Dementia Mother   . Prostate cancer Father   . Cancer Sister   . Lymphoma Brother     CURRENT MEDICATIONS:  Current Outpatient Medications  Medication Sig  Dispense Refill  . abiraterone acetate (ZYTIGA) 250 MG tablet Take 4 tablets (1,000 mg total) by mouth daily. Take on an empty stomach 1 hour before or 2 hours after a meal 120 tablet 11  . predniSONE (DELTASONE) 5 MG tablet Take 1 tablet (5 mg total) by mouth daily with breakfast. 30 tablet 11   No current facility-administered medications for this visit.    ALLERGIES:  No Known Allergies  PHYSICAL EXAM:  Performance status (ECOG): 0 - Asymptomatic  Vitals:   03/03/20 1523  BP: 125/84  Pulse: 74  Resp: 20  Temp: 97.7 F (36.5 C)  SpO2: 95%   Wt Readings from Last 3 Encounters:  03/03/20 226 lb 9.6 oz (102.8 kg)  02/17/20 (!) 225 lb 11.2 oz (102.4 kg)  01/29/20 224 lb 3.2 oz (101.7 kg)   Physical Exam Vitals reviewed.  Constitutional:      Appearance: Normal appearance. He is obese.  Cardiovascular:     Rate and Rhythm: Normal rate and regular rhythm.     Pulses: Normal pulses.     Heart sounds: Normal heart sounds.  Pulmonary:     Effort: Pulmonary effort is normal.     Breath sounds: Normal breath sounds.  Abdominal:     Palpations: Abdomen is soft. There is no mass.     Tenderness: There is no abdominal tenderness.  Musculoskeletal:     Right lower leg: No edema.     Left lower leg: No edema.  Lymphadenopathy:     Cervical: No cervical adenopathy.     Upper Body:     Right upper body: No supraclavicular or axillary adenopathy.     Left upper body: No supraclavicular or axillary adenopathy.  Neurological:     General: No focal deficit present.     Mental Status: He is alert and oriented to person, place, and time.  Psychiatric:        Mood and Affect: Mood normal.        Behavior: Behavior normal.      LABORATORY DATA:  I have reviewed the labs as listed.  CBC Latest Ref Rng & Units 03/03/2020 02/17/2020 01/14/2020  WBC 4.0 - 10.5 K/uL 8.3 7.7 7.1  Hemoglobin 13.0 - 17.0 g/dL 13.1 13.6 13.8  Hematocrit 39 - 52 % 40.1 41.1 42.9  Platelets 150 - 400 K/uL  242 277 232   CMP Latest Ref Rng & Units 03/03/2020 02/17/2020 01/14/2020  Glucose 70 - 99 mg/dL 110(H) 110(H) 94  BUN 6 - 20 mg/dL 15 16 20   Creatinine 0.61 - 1.24 mg/dL 0.79 0.82 0.84  Sodium 135 - 145 mmol/L 137 138 138  Potassium 3.5 - 5.1 mmol/L 3.8 4.1 4.1  Chloride 98 - 111 mmol/L 105 103 102  CO2 22 - 32 mmol/L 24 26 27   Calcium 8.9 - 10.3 mg/dL 9.4 9.7 9.4  Total Protein 6.5 - 8.1 g/dL 7.3 7.6 7.7  Total Bilirubin 0.3 -  1.2 mg/dL 0.7 0.7 1.0  Alkaline Phos 38 - 126 U/L 72 82 72  AST 15 - 41 U/L 36 16 15  ALT 0 - 44 U/L 31 12 10    Lab Results  Component Value Date   LDH 145 02/17/2020  No results found for: PSA   DIAGNOSTIC IMAGING:  I have independently reviewed the scans and discussed with the patient. No results found.   ASSESSMENT:  1. 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 Serum testosterone on 12/17/2019 of 197. -Bone scan on 12/18/2019 at [facility] shows small focus of uptake seen left inferior orbit favoring benign etiology. No suspicious foci of bone meta stasis. -CTAP with contrast on 12/18/2019 showed retroperitoneal and bilateral iliac chain metastatic adenopathy. Left para-aortic lymph node measures 15 mm. Right external iliac lymph node measures 22 x 35 mm. Left external iliac lymph node measures 26 x 35 mm. Ill-defined hypoenhancing 12 mm hepatic lesion, indeterminate, however concerning for metastasis. Sclerotic focus in the right posterior ilium, indeterminate. -MRI of the abdomen on 01/10/2020 shows tiny 8 mm low-attenuation lesion in the posterior right hepatic lobe, too small to characterize.  No other significant abnormalities. -Abiraterone and prednisone started around 02/01/2020.  Degarelix started on 01/29/2020. -Lupron 45 mg on 02/26/2020.  2. Family history: -Father died of prostate cancer at age 55. Sister had cancer  on her leg resected. Paternal uncle had cancer, patient does not know the type.   PLAN:  1. Metastatic castration sensitive prostate cancer: -He is tolerating abiraterone 1000 mg daily along with prednisone very well. -I reviewed his labs which showed normal LFTs and CBC.  PSA has improved to 13.4 on 02/17/2020. -He will continue Abiraterone at the same dose.  Blood pressure is normal. -I plan to see him back in 2 weeks with repeat labs.  2. Family history: -I have recommended genetic testing to evaluate for BRCA1/2 mutations.  3. Left hip pain: -This has improved.  This was thought to be secondary to right knee arthritis.   Orders placed this encounter:  No orders of the defined types were placed in this encounter.    Derek Jack, MD [facility] [place] [phone]   I, Milinda Antis, am acting as a scribe for Dr. Sanda Linger.  I, Derek Jack MD, have reviewed the above documentation for accuracy and completeness, and I agree with the above.

## 2020-03-03 NOTE — Patient Instructions (Signed)
[place] at [facility] Discharge Instructions  You were seen today by Dr. Delton Coombes. He went over your recent results. You will be referred to genetic testing. Dr. Delton Coombes will see you back in 2 weeks for labs and follow up.   Thank you for choosing [place] at [facility] to provide your oncology and hematology care.  To afford each patient quality time with our provider, please arrive at least 15 minutes before your scheduled appointment time.   If you have a lab appointment with the [place] please come in thru the Main Entrance and check in at the main information desk  You need to re-schedule your appointment should you arrive 10 or more minutes late.  We strive to give you quality time with our providers, and arriving late affects you and other patients whose appointments are after yours.  Also, if you no show three or more times for appointments you may be dismissed from the clinic at the providers discretion.     Again, thank you for choosing [facility].  Our hope is that these requests will decrease the amount of time that you wait before being seen by our physicians.       _____________________________________________________________  Should you have questions after your visit to [facility], please contact our office at [phone] between the hours of 8:00 a.m. and 4:30 p.m.  Voicemails left after 4:00 p.m. will not be returned until the following business day.  For prescription refill requests, have your pharmacy contact our office and allow 72 hours.    Cancer Center Support Programs:   > Cancer Support Group  2nd Tuesday of the month 1pm-2pm, Journey Room

## 2020-03-05 ENCOUNTER — Inpatient Hospital Stay (HOSPITAL_COMMUNITY): Payer: 59

## 2020-03-05 ENCOUNTER — Inpatient Hospital Stay (HOSPITAL_BASED_OUTPATIENT_CLINIC_OR_DEPARTMENT_OTHER): Payer: 59 | Admitting: Genetic Counselor

## 2020-03-05 ENCOUNTER — Other Ambulatory Visit: Payer: Self-pay

## 2020-03-05 DIAGNOSIS — C61 Malignant neoplasm of prostate: Secondary | ICD-10-CM | POA: Diagnosis not present

## 2020-03-05 DIAGNOSIS — Z801 Family history of malignant neoplasm of trachea, bronchus and lung: Secondary | ICD-10-CM | POA: Diagnosis not present

## 2020-03-05 DIAGNOSIS — Z807 Family history of other malignant neoplasms of lymphoid, hematopoietic and related tissues: Secondary | ICD-10-CM | POA: Diagnosis not present

## 2020-03-05 DIAGNOSIS — Z8042 Family history of malignant neoplasm of prostate: Secondary | ICD-10-CM | POA: Diagnosis not present

## 2020-03-05 DIAGNOSIS — C772 Secondary and unspecified malignant neoplasm of intra-abdominal lymph nodes: Secondary | ICD-10-CM

## 2020-03-06 ENCOUNTER — Encounter (HOSPITAL_COMMUNITY): Payer: Self-pay | Admitting: Genetic Counselor

## 2020-03-06 DIAGNOSIS — Z801 Family history of malignant neoplasm of trachea, bronchus and lung: Secondary | ICD-10-CM | POA: Insufficient documentation

## 2020-03-06 DIAGNOSIS — Z8042 Family history of malignant neoplasm of prostate: Secondary | ICD-10-CM | POA: Insufficient documentation

## 2020-03-06 DIAGNOSIS — Z807 Family history of other malignant neoplasms of lymphoid, hematopoietic and related tissues: Secondary | ICD-10-CM | POA: Insufficient documentation

## 2020-03-06 NOTE — Progress Notes (Signed)
REFERRING PROVIDER: Derek Jack, MD [address] [place],  [place] [address]  PRIMARY PROVIDER:  Glenda Chroman, MD  PRIMARY REASON FOR VISIT:  1. 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 Family history of prostate cancer   3. Family history of lymphoma   4. Family history of lung cancer      I connected with Mr. Skog on 03/06/2020 at 1:00 pm EDT by video conference and verified that I am speaking with the correct person using two identifiers.   Patient location: [facility] Provider location: [facility] office  HISTORY OF PRESENT ILLNESS:   Mr. Truss, a 59 y.o. male, was seen for a [place] cancer genetics consultation at the request of Dr. Delton Coombes due to a personal and family history of cancer.  Mr. Baltzell presents to clinic today to discuss the possibility of a hereditary predisposition to cancer, genetic testing, and to further clarify his future cancer risks, as well as potential cancer risks for family members.   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 The Gleason score is 4+4=8.    Past Medical History:  Diagnosis Date   Family history of lung cancer    Family history of lymphoma    Family history of prostate cancer    History of hepatitis B 01/14/2020   Prostate cancer [facility])     Past Surgical History:  Procedure Laterality Date   FOREIGN BODY REMOVAL N/A 07/13/2017   Procedure: FOREIGN BODY REMOVAL ADULT;  Surgeon: Helayne Seminole, MD;  Location: [place];  Service: ENT;  Laterality: N/A;   LEG SURGERY     rods in leg from fracture   RIGID ESOPHAGOSCOPY N/A 07/13/2017   Procedure: RIGID ESOPHAGOSCOPY;  Surgeon: Helayne Seminole, MD;  Location: [facility] OR;  Service: ENT;  Laterality: N/A;    Social History   Socioeconomic History   Marital status: Divorced    Spouse name: Not on file   Number of children: 0   Years of education: Not on file    Highest education level: Not on file  Occupational History   Occupation: EMPLOYED  Tobacco Use   Smoking status: Never Smoker   Smokeless tobacco: Never Used  Vaping Use   Vaping Use: Never used  Substance and Sexual Activity   Alcohol use: No   Drug use: No   Sexual activity: Not Currently  Other Topics Concern   Not on file  Social History Narrative   Not on file   Social Determinants of Health   Financial Resource Strain: Medium Risk   Difficulty of Paying Living Expenses: Somewhat hard  Food Insecurity: Food Insecurity Present   Worried About Running Out of Food in the Last Year: Sometimes true   Ran Out of Food in the Last Year: Sometimes true  Transportation Needs: No Transportation Needs   Lack of Transportation (Medical): No   Lack of Transportation (Non-Medical): No  Physical Activity: Inactive   Days of Exercise per Week: 0 days   Minutes of Exercise per Session: 0 min  Stress: Stress Concern Present   Feeling of Stress : To some extent  Social Connections: Moderately Isolated   Frequency of Communication with Friends and Family: Twice a week   Frequency of Social Gatherings with Friends and Family: Twice a week   Attends Religious Services: More than 4 times per year   Active Member of Clubs or Organizations: No  Attends Archivist Meetings: Never   Marital Status: Divorced     FAMILY HISTORY:  We obtained a detailed, 4-generation family history.  Significant diagnoses are listed below: Family History  Problem Relation Age of Onset   Dementia Mother    Prostate cancer Father 55       metastatic   Cancer Sister        cancer on leg, dx. in her 39s   Lymphoma Brother 12   Lung cancer Paternal Aunt        dx. >50   Mr. Jury does not have children. He has two sisters and one brother. One sister had a cancer on her leg diagnosed in her 93s. His brother was diagnosed with lymphoma at the age of 51.   Mr. Garbers  mother is 53 and has not had cancer. He has four maternal aunts and four maternal uncles, several of whom have died older than 76. There is no known cancer among his maternal first cousins. His maternal grandparents died older than 28 and did not have cancer.   Mr. Snook's father died at the age of 38 from metastatic prostate cancer that was first diagnosed at the age of 65. Mr. Gell had two paternal uncles and one paternal aunt. His aunt was diagnosed with lung cancer when she was older than 71, and was a smoker. His paternal grandparents died older than 10 and did not have cancer.  Mr. Asman is unaware of previous family history of genetic testing for hereditary cancer risks. Patient's ancestors are of unknown descent. There is no reported Ashkenazi Jewish ancestry. There is no known consanguinity.  GENETIC COUNSELING ASSESSMENT: Mr. Applegate is a 59 y.o. male with a personal and family history of prostate cancer, which is somewhat suggestive of a hereditary cancer syndrome and predisposition to cancer. We, therefore, discussed and recommended the following at today's visit.   DISCUSSION: We discussed that approximately 5-10% of prostate cancer is hereditary, with most cases associated with the BRCA genes. There are other genes that can be associated with hereditary prostate cancer syndromes. These include ATM, CHEK2, HOXB13, etc. We discussed that testing is beneficial for several reasons, including knowing about other cancer risks, identifying potential screening and risk-reduction options that may be appropriate, identifying whether targeted treatment options such as PARP inhibitors would be beneficial in the future, and to understand if other family members could be at risk for cancer and allow them to undergo genetic testing.  We reviewed the characteristics, features and inheritance patterns of hereditary cancer syndromes. We also discussed genetic testing, including the appropriate family  members to test, the process of testing, insurance coverage and turn-around-time for results. We discussed the implications of a negative, positive and/or variant of uncertain significant result. We recommended Mr. Tomasetti pursue genetic testing for the [facility] Common Hereditary Cancers Panel + Prostate Cancer HRR Panel.  The Common Hereditary Cancers Panel offered by [facility] includes sequencing and/or deletion duplication testing of the following 47 genes: APC, ATM, AXIN2, BARD1, BMPR1A, BRCA1, BRCA2, BRIP1, CDH1, CDK4, CDKN2A (p14ARF), CDKN2A (p16INK4a), CHEK2, CTNNA1, DICER1, EPCAM (Deletion/duplication testing only), GREM1 (promoter region deletion/duplication testing only), KIT, MEN1, MLH1, MSH2, MSH3, MSH6, MUTYH, NBN, NF1, NTHL1, PALB2, PDGFRA, PMS2, POLD1, POLE, PTEN, RAD50, RAD51C, RAD51D, SDHB, SDHC, SDHD, SMAD4, SMARCA4. STK11, TP53, TSC1, TSC2, and VHL.  The following genes were evaluated for sequence changes only: SDHA and HOXB13 c.251G>A variant only. The Prostate Cancer HRR Panel offered by [facility] includes sequencing and/or deletion/duplication analysis  of the following 10 genes: ATM, BARD1, BRCA1, BRCA2, BRIP1, CHEK2, FANCL, PALB2, RAD51C, RAD51D.  We discussed with Mr. Darling that, based on his personal history of prostate cancer, he meets criteria for a sponsored genetic testing program through [facility] called Detect Hereditary Prostate Cancer. Through this program, there would be no cost for genetic testing. We discussed that third parties and commercial organizations may receive de-identified patient data from this program, but at no time would they receive patient identifiable information. Mr. Opheim would like to participate in this program.   PLAN: After considering the risks, benefits, and limitations, Mr. Dellarocco provided informed consent to pursue genetic testing and the blood sample was sent to [facility] for analysis of the [facility] Common Hereditary Cancers  panel + Prostate Cancer HRR panel. Results should be available within approximately two-three weeks' time, at which point they will be disclosed by telephone to Mr. Sison, as will any additional recommendations warranted by these results. Mr. Teater will receive a summary of his genetic counseling visit and a copy of his results once available. This information will also be available in Epic.   Mr. Thang questions were answered to his satisfaction today. Our contact information was provided should additional questions or concerns arise. Thank you for the referral and allowing [place] to share in the care of your patient.   Clint Guy, [place], [facility] Licensed, Certified Dispensing [email or web address] Phone: [phone]  The patient was seen for a total of 30 minutes in face-to-face genetic counseling.  This patient was discussed with Drs. Magrinat, Lindi Adie and/or Burr Medico who agrees with the above.    _______________________________________________________________________ For Office Staff:  Number of people involved in session: 1 Was an Intern/ student involved with case: no

## 2020-03-17 ENCOUNTER — Other Ambulatory Visit (HOSPITAL_COMMUNITY): Payer: Self-pay | Admitting: *Deleted

## 2020-03-17 DIAGNOSIS — C61 Malignant neoplasm of prostate: Secondary | ICD-10-CM

## 2020-03-18 ENCOUNTER — Inpatient Hospital Stay (HOSPITAL_BASED_OUTPATIENT_CLINIC_OR_DEPARTMENT_OTHER): Payer: 59 | Admitting: Hematology

## 2020-03-18 ENCOUNTER — Inpatient Hospital Stay (HOSPITAL_COMMUNITY): Payer: 59

## 2020-03-18 ENCOUNTER — Other Ambulatory Visit: Payer: Self-pay

## 2020-03-18 ENCOUNTER — Encounter (HOSPITAL_COMMUNITY): Payer: Self-pay | Admitting: Hematology

## 2020-03-18 VITALS — BP 125/86 | HR 66 | Temp 97.1°F | Resp 18 | Wt 227.7 lb

## 2020-03-18 DIAGNOSIS — C61 Malignant neoplasm of prostate: Secondary | ICD-10-CM

## 2020-03-18 DIAGNOSIS — C772 Secondary and unspecified malignant neoplasm of intra-abdominal lymph nodes: Secondary | ICD-10-CM | POA: Diagnosis not present

## 2020-03-18 LAB — COMPREHENSIVE METABOLIC PANEL
ALT: 13 U/L (ref 0–44)
AST: 17 U/L (ref 15–41)
Albumin: 4.2 g/dL (ref 3.5–5.0)
Alkaline Phosphatase: 65 U/L (ref 38–126)
Anion gap: 8 (ref 5–15)
BUN: 16 mg/dL (ref 6–20)
CO2: 27 mmol/L (ref 22–32)
Calcium: 9.2 mg/dL (ref 8.9–10.3)
Chloride: 103 mmol/L (ref 98–111)
Creatinine, Ser: 0.86 mg/dL (ref 0.61–1.24)
GFR calc Af Amer: >60 mL/min (ref >60)
GFR calc non Af Amer: >60 mL/min (ref >60)
Glucose, Bld: 112 mg/dL — ABNORMAL HIGH (ref 70–99)
Potassium: 4.3 mmol/L (ref 3.5–5.1)
Sodium: 138 mmol/L (ref 135–145)
Total Bilirubin: 0.7 mg/dL (ref 0.3–1.2)
Total Protein: 7.3 g/dL (ref 6.5–8.1)

## 2020-03-18 LAB — CBC WITH DIFFERENTIAL/PLATELET
Abs Immature Granulocytes: 0.02 10*3/uL (ref 0.00–0.07)
Basophils Absolute: 0 10*3/uL (ref 0.0–0.1)
Basophils Relative: 1 %
Eosinophils Absolute: 0.1 10*3/uL (ref 0.0–0.5)
Eosinophils Relative: 1 %
HCT: 39.4 % (ref 39.0–52.0)
Hemoglobin: 13 g/dL (ref 13.0–17.0)
Immature Granulocytes: 0 %
Lymphocytes Relative: 19 %
Lymphs Abs: 1.1 10*3/uL (ref 0.7–4.0)
MCH: 31.8 pg (ref 26.0–34.0)
MCHC: 33 g/dL (ref 30.0–36.0)
MCV: 96.3 fL (ref 80.0–100.0)
Monocytes Absolute: 0.6 10*3/uL (ref 0.1–1.0)
Monocytes Relative: 9 %
Neutro Abs: 4.2 10*3/uL (ref 1.7–7.7)
Neutrophils Relative %: 70 %
Platelets: 256 10*3/uL (ref 150–400)
RBC: 4.09 MIL/uL — ABNORMAL LOW (ref 4.22–5.81)
RDW: 13.3 % (ref 11.5–15.5)
WBC: 6 10*3/uL (ref 4.0–10.5)
nRBC: 0 % (ref 0.0–0.2)

## 2020-03-18 LAB — MAGNESIUM: Magnesium: 2 mg/dL (ref 1.7–2.4)

## 2020-03-18 NOTE — Progress Notes (Signed)
[facility] [address], [place] [address]   CLINIC:  Medical Oncology/Hematology  PCP:  Glenda Chroman, MD [address] [place] [address] [phone]   REASON FOR VISIT:  Follow-up for prostate cancer  PRIOR THERAPY: None  NGS Results: Not done  CURRENT THERAPY: Zytiga and prednisone  BRIEF ONCOLOGIC HISTORY:  Oncology History   No history exists.    CANCER STAGING: Cancer Staging No matching staging information was found for the patient.  INTERVAL HISTORY:  [name], a 59 y.o. male, returns for routine follow-up of his prostate cancer. Kodey was last seen on 03/03/2020.  Today he reports that he is tolerating the prednisone well. His appetite is good. His left hip pain is stable and tolerable.   REVIEW OF SYSTEMS:  Review of Systems  Constitutional: Positive for fatigue (moderate). Negative for appetite change.  Gastrointestinal: Positive for constipation (occasional).  Neurological: Positive for dizziness (w/ standing).  All other systems reviewed and are negative.   PAST MEDICAL/SURGICAL HISTORY:  Past Medical History:  Diagnosis Date  . Family history of lung cancer   . Family history of lymphoma   . Family history of prostate cancer   . History of hepatitis B 01/14/2020  . Prostate cancer [facility])    Past Surgical History:  Procedure Laterality Date  . FOREIGN BODY REMOVAL N/A 07/13/2017   Procedure: FOREIGN BODY REMOVAL ADULT;  Surgeon: Helayne Seminole, MD;  Location: [place];  Service: ENT;  Laterality: N/A;  . LEG SURGERY     rods in leg from fracture  . RIGID ESOPHAGOSCOPY N/A 07/13/2017   Procedure: RIGID ESOPHAGOSCOPY;  Surgeon: Helayne Seminole, MD;  Location: [facility] OR;  Service: ENT;  Laterality: N/A;    SOCIAL HISTORY:  Social History   Socioeconomic History  . Marital status: Divorced    Spouse name: Not on file  . Number of children: 0  . Years of education: Not on file  . Highest  education level: Not on file  Occupational History  . Occupation: EMPLOYED  Tobacco Use  . Smoking status: Never Smoker  . Smokeless tobacco: Never Used  Vaping Use  . Vaping Use: Never used  Substance and Sexual Activity  . Alcohol use: No  . Drug use: No  . Sexual activity: Not Currently  Other Topics Concern  . Not on file  Social History Narrative  . Not on file   Social Determinants of Health   Financial Resource Strain: Medium Risk  . Difficulty of Paying Living Expenses: Somewhat hard  Food Insecurity: Food Insecurity Present  . Worried About Charity fundraiser in the Last Year: Sometimes true  . Ran Out of Food in the Last Year: Sometimes true  Transportation Needs: No Transportation Needs  . Lack of Transportation (Medical): No  . Lack of Transportation (Non-Medical): No  Physical Activity: Inactive  . Days of Exercise per Week: 0 days  . Minutes of Exercise per Session: 0 min  Stress: Stress Concern Present  . Feeling of Stress : To some extent  Social Connections: Moderately Isolated  . Frequency of Communication with Friends and Family: Twice a week  . Frequency of Social Gatherings with Friends and Family: Twice a week  . Attends Religious Services: More than 4 times per year  . Active Member of Clubs or Organizations: No  . Attends Archivist Meetings: Never  . Marital Status: Divorced  Human resources officer Violence: Not At Risk  .  Fear of Current or Ex-Partner: No  . Emotionally Abused: No  . Physically Abused: No  . Sexually Abused: No    FAMILY HISTORY:  Family History  Problem Relation Age of Onset  . Dementia Mother   . Prostate cancer Father 4       metastatic  . Cancer Sister        cancer on leg, dx. in her 64s  . Lymphoma Brother 17  . Lung cancer Paternal Aunt        dx. >50    CURRENT MEDICATIONS:  Current Outpatient Medications  Medication Sig Dispense Refill  . abiraterone acetate (ZYTIGA) 250 MG tablet Take 4 tablets  (1,000 mg total) by mouth daily. Take on an empty stomach 1 hour before or 2 hours after a meal 120 tablet 11  . predniSONE (DELTASONE) 5 MG tablet Take 1 tablet (5 mg total) by mouth daily with breakfast. 30 tablet 11   No current facility-administered medications for this visit.    ALLERGIES:  No Known Allergies  PHYSICAL EXAM:  Performance status (ECOG): 0 - Asymptomatic  Vitals:   03/18/20 1144  BP: 125/86  Pulse: 66  Resp: 18  Temp: (!) 97.1 F (36.2 C)  SpO2: 98%   Wt Readings from Last 3 Encounters:  03/18/20 227 lb 11.2 oz (103.3 kg)  03/03/20 226 lb 9.6 oz (102.8 kg)  02/17/20 (!) 225 lb 11.2 oz (102.4 kg)   Physical Exam Vitals reviewed.  Constitutional:      Appearance: Normal appearance. He is obese.  Cardiovascular:     Rate and Rhythm: Normal rate and regular rhythm.     Pulses: Normal pulses.     Heart sounds: Normal heart sounds.  Pulmonary:     Effort: Pulmonary effort is normal.     Breath sounds: Normal breath sounds.  Abdominal:     Palpations: Abdomen is soft. There is no mass.     Tenderness: There is no abdominal tenderness.  Musculoskeletal:     Right lower leg: No edema.     Left lower leg: No edema.  Neurological:     General: No focal deficit present.     Mental Status: He is alert and oriented to person, place, and time.  Psychiatric:        Mood and Affect: Mood normal.        Behavior: Behavior normal.      LABORATORY DATA:  I have reviewed the labs as listed.  CBC Latest Ref Rng & Units 03/18/2020 03/03/2020 02/17/2020  WBC 4.0 - 10.5 K/uL 6.0 8.3 7.7  Hemoglobin 13.0 - 17.0 g/dL 13.0 13.1 13.6  Hematocrit 39 - 52 % 39.4 40.1 41.1  Platelets 150 - 400 K/uL 256 242 277   CMP Latest Ref Rng & Units 03/18/2020 03/03/2020 02/17/2020  Glucose 70 - 99 mg/dL 112(H) 110(H) 110(H)  BUN 6 - 20 mg/dL 16 15 16   Creatinine 0.61 - 1.24 mg/dL 0.86 0.79 0.82  Sodium 135 - 145 mmol/L 138 137 138  Potassium 3.5 - 5.1 mmol/L 4.3 3.8 4.1   Chloride 98 - 111 mmol/L 103 105 103  CO2 22 - 32 mmol/L 27 24 26   Calcium 8.9 - 10.3 mg/dL 9.2 9.4 9.7  Total Protein 6.5 - 8.1 g/dL 7.3 7.3 7.6  Total Bilirubin 0.3 - 1.2 mg/dL 0.7 0.7 0.7  Alkaline Phos 38 - 126 U/L 65 72 82  AST 15 - 41 U/L 17 36 16  ALT 0 - 44 U/L  13 31 12   No results found for: PSA Lab Results  Component Value Date   LDH 145 02/17/2020   DIAGNOSTIC IMAGING:  I have independently reviewed the scans and discussed with the patient. No results found.   ASSESSMENT:  1. 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 Serum testosterone on 12/17/2019 of 197. -Bone scan on 12/18/2019 at [facility] shows small focus of uptake seen left inferior orbit favoring benign etiology. No suspicious foci of bone meta stasis. -CTAP with contrast on 12/18/2019 showed retroperitoneal and bilateral iliac chain metastatic adenopathy. Left para-aortic lymph node measures 15 mm. Right external iliac lymph node measures 22 x 35 mm. Left external iliac lymph node measures 26 x 35 mm. Ill-defined hypoenhancing 12 mm hepatic lesion, indeterminate, however concerning for metastasis. Sclerotic focus in the right posterior ilium, indeterminate. -MRI of the abdomen on 01/10/2020 shows tiny 8 mm low-attenuation lesion in the posterior right hepatic lobe, too small to characterize. No other significant abnormalities. -Abiraterone and prednisone started around 02/01/2020. Degarelix started on 01/29/2020. -Lupron 45 mg on 02/26/2020.  2. Family history: -Father died of prostate cancer at age 6. Sister had cancer on her leg resected. Paternal uncle had cancer, patient does not know the type.   PLAN:  1. Metastatic castration sensitive prostate cancer: -He is tolerating abiraterone 1000 mg daily along with prednisone very well. -Potassium and blood pressure are  normal. -Last PSA was 13.4 on 02/17/2020. -Continue Abiraterone at the same dose.  Reevaluate in 4 weeks with labs and PSA.  2. Family history: -I have recommended genetic testing.  He was evaluated by our geneticist on 03/05/2020.  3. Left hip pain: -This has improved.  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   Orders placed this encounter:  No orders of the defined types were placed in this encounter.    Derek Jack, MD [place] [phone]   I, Milinda Antis, am acting as a scribe for Dr. Sanda Linger.  I, Derek Jack MD, have reviewed the above documentation for accuracy and completeness, and I agree with the above.

## 2020-03-18 NOTE — Patient Instructions (Signed)
[facility] at [facility] Discharge Instructions  You were seen today by Dr. Katragadda. He went over your recent results. Dr. Katragadda will see you back in 1 month for labs and follow up.   Thank you for choosing [facility] at [facility] to provide your oncology and hematology care.  To afford each patient quality time with our provider, please arrive at least 15 minutes before your scheduled appointment time.   If you have a lab appointment with the Cancer Center please come in thru the Main Entrance and check in at the main information desk  You need to re-schedule your appointment should you arrive 10 or more minutes late.  We strive to give you quality time with our providers, and arriving late affects you and other patients whose appointments are after yours.  Also, if you no show three or more times for appointments you may be dismissed from the clinic at the providers discretion.     Again, thank you for choosing [facility].  Our hope is that these requests will decrease the amount of time that you wait before being seen by our physicians.       _____________________________________________________________  Should you have questions after your visit to [facility], please contact our office at [phone] between the hours of 8:00 a.m. and 4:30 p.m.  Voicemails left after 4:00 p.m. will not be returned until the following business day.  For prescription refill requests, have your pharmacy contact our office and allow 72 hours.    Cancer Center Support Programs:   > Cancer Support Group  2nd Tuesday of the month 1pm-2pm, Journey Room   

## 2020-03-20 ENCOUNTER — Telehealth: Payer: Self-pay | Admitting: Genetic Counselor

## 2020-03-20 NOTE — Telephone Encounter (Signed)
LVM that his genetic test results are available and requested that he call back to discuss them.  

## 2020-03-26 NOTE — Telephone Encounter (Signed)
LVM that his genetic test results are available and requested that he call back to discuss them.  

## 2020-03-31 ENCOUNTER — Ambulatory Visit: Payer: Self-pay | Admitting: Genetic Counselor

## 2020-03-31 ENCOUNTER — Encounter: Payer: Self-pay | Admitting: Genetic Counselor

## 2020-03-31 DIAGNOSIS — Z1379 Encounter for other screening for genetic and chromosomal anomalies: Secondary | ICD-10-CM | POA: Insufficient documentation

## 2020-03-31 NOTE — Progress Notes (Signed)
HPI:  Randall Dawson was previously seen in the [place] clinic due to a personal and family history of cancer and concerns regarding a hereditary predisposition to cancer. Please refer to our prior cancer genetics clinic note for more information regarding our discussion, assessment and recommendations, at the time. Randall Dawson recent genetic test results were disclosed to him, as were recommendations warranted by these results. These results and recommendations are discussed in more detail below.  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 The report date is 03/15/2020.   The Common Hereditary Cancers Panel offered by [facility] includes sequencing and/or deletion duplication testing of the following 47 genes: APC, ATM, AXIN2, BARD1, BMPR1A, BRCA1, BRCA2, BRIP1, CDH1, CDK4, CDKN2A (p14ARF), CDKN2A (p16INK4a), CHEK2, CTNNA1, DICER1, EPCAM (Deletion/duplication testing only), GREM1 (promoter region deletion/duplication testing only), KIT, MEN1, MLH1, MSH2, MSH3, MSH6, MUTYH, NBN, NF1, NTHL1, PALB2, PDGFRA, PMS2, POLD1, POLE, PTEN, RAD50, RAD51C, RAD51D, SDHB, SDHC, SDHD, SMAD4, SMARCA4. STK11, TP53, TSC1, TSC2, and VHL.  The following genes were evaluated for sequence changes only: SDHA and HOXB13 c.251G>A variant only. The Prostate Cancer HRR Panel offered by [facility] includes sequencing and/or deletion/duplication analysis of the following 10 genes: ATM, BARD1, BRCA1, BRCA2, BRIP1, CHEK2, FANCL, PALB2, RAD51C, RAD51D.     FAMILY HISTORY:  We obtained a detailed, 4-generation family history.  Significant diagnoses are listed below: Family History  Problem Relation Age of Onset  .  Dementia Mother   . Prostate cancer Father 29       metastatic  . Cancer Sister        cancer on leg, dx. in her 71s  . Lymphoma Brother 47  . Lung cancer Paternal Aunt        dx. >50    Randall Dawson does not have children. He has two sisters and one brother. One sister had a cancer on her leg diagnosed in her 48s. His brother was diagnosed with lymphoma at the age of 3.   Randall Dawson mother is 38 and has not had cancer. He has four maternal aunts and four maternal uncles, several of whom have died older than 50. There is no known cancer among his maternal first cousins. His maternal grandparents died older than 88 and did not have cancer.   Randall Dawson's father died at the age of 65 from metastatic prostate cancer that was first diagnosed at the age of 14. Randall Dawson had two paternal uncles and one paternal aunt. His aunt was diagnosed with lung cancer when she was older than 24, and was a smoker. His paternal grandparents died older than 15 and did not have cancer.  Randall Dawson is unaware of previous family history of genetic testing for hereditary cancer risks. Patient's ancestors are of unknown descent. There is no reported Ashkenazi Jewish ancestry. There is no known consanguinity.  GENETIC TEST RESULTS: Genetic testing reported out on 03/15/2020 through the [facility] Common Hereditary Cancers Panel + Prostate Cancer HRR Panel. No pathogenic variants were detected.   The Common Hereditary Cancers Panel offered by [facility] includes sequencing and/or deletion duplication testing of the following 47 genes: APC, ATM, AXIN2, BARD1, BMPR1A, BRCA1, BRCA2, BRIP1, CDH1, CDK4, CDKN2A (p14ARF), CDKN2A (p16INK4a), CHEK2, CTNNA1, DICER1, EPCAM (  Deletion/duplication testing only), GREM1 (promoter region deletion/duplication testing only), KIT, MEN1, MLH1, MSH2, MSH3, MSH6, MUTYH, NBN, NF1, NTHL1, PALB2, PDGFRA, PMS2, POLD1, POLE, PTEN, RAD50, RAD51C, RAD51D, SDHB, SDHC, SDHD, SMAD4, SMARCA4. STK11,  TP53, TSC1, TSC2, and VHL.  The following genes were evaluated for sequence changes only: SDHA and HOXB13 c.251G>A variant only. The Prostate Cancer HRR Panel offered by [facility] includes sequencing and/or deletion/duplication analysis of the following 10 genes: ATM, BARD1, BRCA1, BRCA2, BRIP1, CHEK2, FANCL, PALB2, RAD51C, RAD51D. The test report will be scanned into EPIC and located under the Molecular Pathology section of the Results Review tab.  A portion of the result report is included below for reference.     We discussed with Randall Dawson that because current genetic testing is not perfect, it is possible there may be a gene mutation in one of these genes that current testing cannot detect, but that chance is small.  We also discussed that there could be another gene that has not yet been discovered, or that we have not yet tested, that is responsible for the cancer diagnoses in the family. It is also possible there is a hereditary cause for the cancer in the family that Randall Dawson did not inherit and therefore was not identified in his testing.  Therefore, it is important to remain in touch with cancer genetics in the future so that we can continue to offer Randall Dawson the most up to date genetic testing.   CANCER SCREENING RECOMMENDATIONS: Randall Dawson test result is considered negative (normal).  This means that we have not identified a hereditary cause for his personal and family history of cancer at this time. While reassuring, this does not definitively rule out a hereditary predisposition to cancer. It is still possible that there could be genetic mutations that are undetectable by current technology. There could be genetic mutations in genes that have not been tested or identified to increase cancer risk.  Therefore, it is recommended he continue to follow the cancer management and screening guidelines provided by his oncology and primary healthcare provider.   An individual's cancer risk and  medical management are not determined by genetic test results alone. Overall cancer risk assessment incorporates additional factors, including personal medical history, family history, and any available genetic information that may result in a personalized plan for cancer prevention and surveillance.  RECOMMENDATIONS FOR FAMILY MEMBERS:  Individuals in this family might be at some increased risk of developing cancer, over the general population risk, simply due to the family history of cancer.  We recommended women in this family have a yearly mammogram beginning at age 12, or 38 years younger than the earliest onset of cancer, an annual clinical breast exam, and perform monthly breast self-exams. Women in this family should also have a gynecological exam as recommended by their primary provider. All family members should be referred for colonoscopy starting at age 38.  FOLLOW-UP: Lastly, we discussed with Randall Dawson that cancer genetics is a rapidly advancing field and it is possible that new genetic tests will be appropriate for him and/or his family members in the future. We encouraged him to remain in contact with cancer genetics on an annual basis so we can update his personal and family histories and let him know of advances in cancer genetics that may benefit this family.   Our contact number was provided. Randall Dawson questions were answered to his satisfaction, and he knows he is welcome to call us at anytime with additional questions  or concerns.   Clint Guy, MS, [facility] Genetic Counselor [email or web address] Phone: [phone]

## 2020-03-31 NOTE — Telephone Encounter (Signed)
Revealed negative genetic testing. Discussed that we do not know why he has prostate cancer or why there is cancer in the family. It is possible that there could be a mutation in a different gene that we are not testing, or our current technology may not be able detect certain mutations. It will therefore be important for him to stay in contact with genetics to keep up with whether additional testing may be appropriate in the future.

## 2020-04-20 ENCOUNTER — Inpatient Hospital Stay (HOSPITAL_COMMUNITY): Payer: 59 | Attending: Hematology | Admitting: Hematology

## 2020-04-20 ENCOUNTER — Other Ambulatory Visit (HOSPITAL_COMMUNITY): Payer: Self-pay | Admitting: *Deleted

## 2020-04-20 ENCOUNTER — Inpatient Hospital Stay (HOSPITAL_COMMUNITY): Payer: 59 | Attending: Hematology

## 2020-04-20 ENCOUNTER — Other Ambulatory Visit: Payer: Self-pay

## 2020-04-20 ENCOUNTER — Encounter (HOSPITAL_COMMUNITY): Payer: Self-pay | Admitting: Hematology

## 2020-04-20 VITALS — BP 139/93 | HR 72 | Temp 96.8°F | Resp 18 | Wt 232.4 lb

## 2020-04-20 DIAGNOSIS — R0789 Other chest pain: Secondary | ICD-10-CM | POA: Diagnosis not present

## 2020-04-20 DIAGNOSIS — C61 Malignant neoplasm of prostate: Secondary | ICD-10-CM

## 2020-04-20 DIAGNOSIS — C772 Secondary and unspecified malignant neoplasm of intra-abdominal lymph nodes: Secondary | ICD-10-CM | POA: Insufficient documentation

## 2020-04-20 LAB — CBC WITH DIFFERENTIAL/PLATELET
Abs Immature Granulocytes: 0.03 10*3/uL (ref 0.00–0.07)
Basophils Absolute: 0 10*3/uL (ref 0.0–0.1)
Basophils Relative: 1 %
Eosinophils Absolute: 0 10*3/uL (ref 0.0–0.5)
Eosinophils Relative: 1 %
HCT: 38.4 % — ABNORMAL LOW (ref 39.0–52.0)
Hemoglobin: 12.5 g/dL — ABNORMAL LOW (ref 13.0–17.0)
Immature Granulocytes: 1 %
Lymphocytes Relative: 18 %
Lymphs Abs: 1.2 10*3/uL (ref 0.7–4.0)
MCH: 31.7 pg (ref 26.0–34.0)
MCHC: 32.6 g/dL (ref 30.0–36.0)
MCV: 97.5 fL (ref 80.0–100.0)
Monocytes Absolute: 0.5 10*3/uL (ref 0.1–1.0)
Monocytes Relative: 8 %
Neutro Abs: 4.8 10*3/uL (ref 1.7–7.7)
Neutrophils Relative %: 71 %
Platelets: 254 10*3/uL (ref 150–400)
RBC: 3.94 MIL/uL — ABNORMAL LOW (ref 4.22–5.81)
RDW: 13.2 % (ref 11.5–15.5)
WBC: 6.6 10*3/uL (ref 4.0–10.5)
nRBC: 0 % (ref 0.0–0.2)

## 2020-04-20 LAB — COMPREHENSIVE METABOLIC PANEL
ALT: 15 U/L (ref 0–44)
AST: 20 U/L (ref 15–41)
Albumin: 3.9 g/dL (ref 3.5–5.0)
Alkaline Phosphatase: 63 U/L (ref 38–126)
Anion gap: 8 (ref 5–15)
BUN: 15 mg/dL (ref 6–20)
CO2: 27 mmol/L (ref 22–32)
Calcium: 9.1 mg/dL (ref 8.9–10.3)
Chloride: 103 mmol/L (ref 98–111)
Creatinine, Ser: 0.82 mg/dL (ref 0.61–1.24)
GFR calc Af Amer: >60 mL/min (ref >60)
GFR calc non Af Amer: >60 mL/min (ref >60)
Glucose, Bld: 113 mg/dL — ABNORMAL HIGH (ref 70–99)
Potassium: 3.9 mmol/L (ref 3.5–5.1)
Sodium: 138 mmol/L (ref 135–145)
Total Bilirubin: 0.6 mg/dL (ref 0.3–1.2)
Total Protein: 6.9 g/dL (ref 6.5–8.1)

## 2020-04-20 LAB — LACTATE DEHYDROGENASE: LDH: 167 U/L (ref 98–192)

## 2020-04-20 LAB — PSA: Prostatic Specific Antigen: 0.59 ng/mL (ref 0.00–4.00)

## 2020-04-20 MED ORDER — PREDNISONE 5 MG PO TABS: 5.0000 mg | ORAL_TABLET | Freq: Two times a day (BID) | ORAL | 11 refills | Status: DC

## 2020-04-20 NOTE — Progress Notes (Signed)
Patient is taking zytiga as prescribed and has not missed any doses.  Per patient he is having some angina with activity.  He is being referred to cardiology.

## 2020-04-20 NOTE — Patient Instructions (Signed)
[place] at [facility] Discharge Instructions  You were seen today by Dr. Delton Coombes. He went over your recent results. Start taking prednisone 5 mg twice daily. You will be referred to a cardiologist for your chest pain; if your chest pain does not ease up, go to the emergency department. Dr. Delton Coombes will see you back in 4 weeks for labs and follow up.   Thank you for choosing [place] at [facility] to provide your oncology and hematology care.  To afford each patient quality time with our provider, please arrive at least 15 minutes before your scheduled appointment time.   If you have a lab appointment with the [facility] please come in thru the Main Entrance and check in at the main information desk  You need to re-schedule your appointment should you arrive 10 or more minutes late.  We strive to give you quality time with our providers, and arriving late affects you and other patients whose appointments are after yours.  Also, if you no show three or more times for appointments you may be dismissed from the clinic at the providers discretion.     Again, thank you for choosing [facility].  Our hope is that these requests will decrease the amount of time that you wait before being seen by our physicians.       _____________________________________________________________  Should you have questions after your visit to [facility], please contact our office at [phone] between the hours of 8:00 a.m. and 4:30 p.m.  Voicemails left after 4:00 p.m. will not be returned until the following business day.  For prescription refill requests, have your pharmacy contact our office and allow 72 hours.    Cancer Center Support Programs:   > Cancer Support Group  2nd Tuesday of the month 1pm-2pm, Journey Room

## 2020-04-20 NOTE — Progress Notes (Signed)
Randall Dawson, [place] [id number]   CLINIC:  Medical Oncology/Hematology  PCP:  Glenda Chroman, MD [address] 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 The report date is 03/15/2020.   The Common Hereditary Cancers Panel offered by [facility] includes sequencing and/or deletion duplication testing of the following 47 genes: APC, ATM, AXIN2, BARD1, BMPR1A, BRCA1, BRCA2, BRIP1, CDH1, CDK4, CDKN2A (p14ARF), CDKN2A (p16INK4a), CHEK2, CTNNA1, DICER1, EPCAM (Deletion/duplication testing only), GREM1 (promoter region deletion/duplication testing only), KIT, MEN1, MLH1, MSH2, MSH3, MSH6, MUTYH, NBN, NF1, NTHL1, PALB2, PDGFRA, PMS2, POLD1, POLE, PTEN, RAD50, RAD51C, RAD51D, SDHB, SDHC, SDHD, SMAD4, SMARCA4. STK11, TP53, TSC1, TSC2, and VHL.  The following genes were evaluated for sequence changes only: SDHA and HOXB13 c.251G>A variant only. The Prostate Cancer HRR Panel offered by [facility] includes sequencing and/or deletion/duplication analysis of the following 10 genes: ATM, BARD1, BRCA1, BRCA2, BRIP1, CHEK2, FANCL, PALB2, RAD51C, RAD51D.     CANCER STAGING: Cancer Staging No matching staging information was found for the patient.  INTERVAL HISTORY:  [name], a 59 y.o. male, returns for routine follow-up of his prostate cancer. Randall Dawson was last seen on 03/18/2020.   Today he reports feeling well. He reports having an episode of sharp chest pain in his mid-chest 1  week ago while sitting, lasting 15 minutes, not radiating, not inducing nausea or SOB, and took Tums which helped it go away. He reports that he had 3 such episodes, roughly once a week for the past 3 weeks. He denies having any issues with his heart prior to this and does not take anything for his heart. He reports that the CP started when he started taking Zytiga, which he has otherwise been handling well. He denies leg swelling.   REVIEW OF SYSTEMS:  Review of Systems  Constitutional: Positive for fatigue (moderate). Negative for appetite change.  Cardiovascular: Positive for chest pain (sharp, mid-chest, non-radiating). Negative for leg swelling.  Gastrointestinal: Positive for constipation (on laxative) and nausea (d/t Zytiga).  All other systems reviewed and are negative.   PAST MEDICAL/SURGICAL HISTORY:  Past Medical History:  Diagnosis Date   Family history of lung cancer    Family history of lymphoma    Family history of prostate cancer    History of hepatitis B 01/14/2020   Prostate cancer [facility])    Past Surgical History:  Procedure Laterality Date   FOREIGN BODY REMOVAL N/A 07/13/2017   Procedure: FOREIGN BODY REMOVAL ADULT;  Surgeon: Helayne Seminole, MD;  Location: [place];  Service: ENT;  Laterality: N/A;   LEG SURGERY     rods in leg from fracture   RIGID ESOPHAGOSCOPY N/A 07/13/2017   Procedure: RIGID ESOPHAGOSCOPY;  Surgeon: Helayne Seminole, MD;  Location: [facility] OR;  Service: ENT;  Laterality: N/A;    SOCIAL HISTORY:  Social History   Socioeconomic History   Marital status: Divorced    Spouse name: Not on file   Number of children: 0   Years of education:  Not on file   Highest education level: Not on file  Occupational History   Occupation: EMPLOYED  Tobacco Use   Smoking status: Never Smoker   Smokeless tobacco: Never Used  Vaping Use   Vaping Use: Never used  Substance and Sexual Activity   Alcohol use: No   Drug use: No    Sexual activity: Not Currently  Other Topics Concern   Not on file  Social History Narrative   Not on file   Social Determinants of Health   Financial Resource Strain: Medium Risk   Difficulty of Paying Living Expenses: Somewhat hard  Food Insecurity: Food Insecurity Present   Worried About Running Out of Food in the Last Year: Sometimes true   Ran Out of Food in the Last Year: Sometimes true  Transportation Needs: No Transportation Needs   Lack of Transportation (Medical): No   Lack of Transportation (Non-Medical): No  Physical Activity: Inactive   Days of Exercise per Week: 0 days   Minutes of Exercise per Session: 0 min  Stress: Stress Concern Present   Feeling of Stress : To some extent  Social Connections: Moderately Isolated   Frequency of Communication with Friends and Family: Twice a week   Frequency of Social Gatherings with Friends and Family: Twice a week   Attends Religious Services: More than 4 times per year   Active Member of [employer] or Organizations: No   Attends Music therapist: Never   Marital Status: Divorced  Human resources officer Violence: Not At Risk   Fear of Current or Ex-Partner: No   Emotionally Abused: No   Physically Abused: No   Sexually Abused: No    FAMILY HISTORY:  Family History  Problem Relation Age of Onset   Dementia Mother    Prostate cancer Father 46       metastatic   Cancer Sister        cancer on leg, dx. in her 4s   Lymphoma Brother 19   Lung cancer Paternal Aunt        dx. >50    CURRENT MEDICATIONS:  Current Outpatient Medications  Medication Sig Dispense Refill   abiraterone acetate (ZYTIGA) 250 MG tablet Take 4 tablets (1,000 mg total) by mouth daily. Take on an empty stomach 1 hour before or 2 hours after a meal 120 tablet 11   predniSONE (DELTASONE) 5 MG tablet Take 1 tablet (5 mg total) by mouth 2 (two) times daily with a meal. 60 tablet 11   No current facility-administered  medications for this visit.    ALLERGIES:  No Known Allergies  PHYSICAL EXAM:  Performance status (ECOG): 0 - Asymptomatic  Vitals:   04/20/20 1038  BP: (!) 139/93  Pulse: 72  Resp: 18  Temp: (!) 96.8 F (36 C)  SpO2: 98%   Wt Readings from Last 3 Encounters:  04/20/20 232 lb 6.4 oz (105.4 kg)  03/18/20 227 lb 11.2 oz (103.3 kg)  03/03/20 226 lb 9.6 oz (102.8 kg)   Physical Exam Vitals reviewed.  Constitutional:      Appearance: Normal appearance. He is obese.  Cardiovascular:     Rate and Rhythm: Normal rate and regular rhythm.     Pulses: Normal pulses.     Heart sounds: Normal heart sounds.  Pulmonary:     Effort: Pulmonary effort is normal.     Breath sounds: Normal breath sounds.  Neurological:     General: No focal deficit present.     Mental Status:  He is alert and oriented to person, place, and time.  Psychiatric:        Mood and Affect: Mood normal.        Behavior: Behavior normal.      LABORATORY DATA:  I have reviewed the labs as listed.  CBC Latest Ref Rng & Units 04/20/2020 03/18/2020 03/03/2020  WBC 4.0 - 10.5 K/uL 6.6 6.0 8.3  Hemoglobin 13.0 - 17.0 g/dL 12.5(L) 13.0 13.1  Hematocrit 39 - 52 % 38.4(L) 39.4 40.1  Platelets 150 - 400 K/uL 254 256 242   CMP Latest Ref Rng & Units 04/20/2020 03/18/2020 03/03/2020  Glucose 70 - 99 mg/dL 113(H) 112(H) 110(H)  BUN 6 - 20 mg/dL 15 16 15   Creatinine 0.61 - 1.24 mg/dL 0.82 0.86 0.79  Sodium 135 - 145 mmol/L 138 138 137  Potassium 3.5 - 5.1 mmol/L 3.9 4.3 3.8  Chloride 98 - 111 mmol/L 103 103 105  CO2 22 - 32 mmol/L 27 27 24   Calcium 8.9 - 10.3 mg/dL 9.1 9.2 9.4  Total Protein 6.5 - 8.1 g/dL 6.9 7.3 7.3  Total Bilirubin 0.3 - 1.2 mg/dL 0.6 0.7 0.7  Alkaline Phos 38 - 126 U/L 63 65 72  AST 15 - 41 U/L 20 17 36  ALT 0 - 44 U/L 15 13 31    Lab Results  Component Value Date   LDH 167 04/20/2020   LDH 145 02/17/2020    DIAGNOSTIC IMAGING:  I have independently reviewed the scans and discussed with the  patient. No results found.   ASSESSMENT:  1. 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 Serum testosterone on 12/17/2019 of 197. -Bone scan on 12/18/2019 at [facility] shows small focus of uptake seen left inferior orbit favoring benign etiology. No suspicious foci of bone meta stasis. -CTAP with contrast on 12/18/2019 showed retroperitoneal and bilateral iliac chain metastatic adenopathy. Left para-aortic lymph node measures 15 mm. Right external iliac lymph node measures 22 x 35 mm. Left external iliac lymph node measures 26 x 35 mm. Ill-defined hypoenhancing 12 mm hepatic lesion, indeterminate, however concerning for metastasis. Sclerotic focus in the right posterior ilium, indeterminate. -MRI of the abdomen on 01/10/2020 shows tiny 8 mm low-attenuation lesion in the posterior right hepatic lobe, too small to characterize. No other significant abnormalities. -Abiraterone and prednisone started around 02/01/2020. Degarelix started on 01/29/2020. -Lupron 45 mg on 02/26/2020.  2. Family history: -Father died of prostate cancer at age 36. Sister had cancer on her leg resected. Paternal uncle had cancer, patient does not know the type.   PLAN:  1. Metastatic castration sensitive prostate cancer: -He is tolerating abiraterone 1000 mg daily.  Prednisone is 5 mg daily.  PSA improved to 0.59 today. -However his blood pressure is elevated at 139/93. -Will increase prednisone to 5 mg twice daily. -RTC 4 weeks for follow-up.  2. Family history: -[facility] testing was negative for germline mutation.  3. Left hip pain: -This has improved.  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  4.  Chest pain: -He has experienced chest discomfort with pressure on 3 occasions over the last 2 weeks.  Last episode was 1 week ago.  This happened while resting.  He never had any  history of cardiac problems.  Pain lasted about 15 minutes.  He took Tums after the last episode which helped. -Because of atypical chest pain, I have recommended cardiology consultation for a stress  test.   Orders placed this encounter:  No orders of the defined types were placed in this encounter.    Derek Jack, MD [facility] [phone]   I, Milinda Antis, am acting as a scribe for Dr. Sanda Linger.  I, Derek Jack MD, have reviewed the above documentation for accuracy and completeness, and I agree with the above.

## 2020-05-20 ENCOUNTER — Inpatient Hospital Stay (HOSPITAL_BASED_OUTPATIENT_CLINIC_OR_DEPARTMENT_OTHER): Payer: 59 | Admitting: Hematology

## 2020-05-20 ENCOUNTER — Other Ambulatory Visit: Payer: Self-pay

## 2020-05-20 ENCOUNTER — Inpatient Hospital Stay (HOSPITAL_COMMUNITY): Payer: 59 | Attending: Hematology

## 2020-05-20 VITALS — BP 141/89 | HR 69 | Temp 97.2°F | Resp 18 | Wt 233.2 lb

## 2020-05-20 DIAGNOSIS — Z806 Family history of leukemia: Secondary | ICD-10-CM | POA: Insufficient documentation

## 2020-05-20 DIAGNOSIS — M25552 Pain in left hip: Secondary | ICD-10-CM | POA: Insufficient documentation

## 2020-05-20 DIAGNOSIS — Z191 Hormone sensitive malignancy status: Secondary | ICD-10-CM | POA: Insufficient documentation

## 2020-05-20 DIAGNOSIS — C61 Malignant neoplasm of prostate: Secondary | ICD-10-CM | POA: Diagnosis present

## 2020-05-20 DIAGNOSIS — R079 Chest pain, unspecified: Secondary | ICD-10-CM | POA: Diagnosis not present

## 2020-05-20 DIAGNOSIS — Z79899 Other long term (current) drug therapy: Secondary | ICD-10-CM | POA: Insufficient documentation

## 2020-05-20 DIAGNOSIS — R232 Flushing: Secondary | ICD-10-CM | POA: Insufficient documentation

## 2020-05-20 DIAGNOSIS — Z801 Family history of malignant neoplasm of trachea, bronchus and lung: Secondary | ICD-10-CM | POA: Insufficient documentation

## 2020-05-20 DIAGNOSIS — C772 Secondary and unspecified malignant neoplasm of intra-abdominal lymph nodes: Secondary | ICD-10-CM

## 2020-05-20 LAB — COMPREHENSIVE METABOLIC PANEL
ALT: 14 U/L (ref 0–44)
AST: 18 U/L (ref 15–41)
Albumin: 4 g/dL (ref 3.5–5.0)
Alkaline Phosphatase: 71 U/L (ref 38–126)
Anion gap: 7 (ref 5–15)
BUN: 17 mg/dL (ref 6–20)
CO2: 28 mmol/L (ref 22–32)
Calcium: 9.3 mg/dL (ref 8.9–10.3)
Chloride: 102 mmol/L (ref 98–111)
Creatinine, Ser: 0.8 mg/dL (ref 0.61–1.24)
GFR, Estimated: >60 mL/min (ref >60)
Glucose, Bld: 90 mg/dL (ref 70–99)
Potassium: 4 mmol/L (ref 3.5–5.1)
Sodium: 137 mmol/L (ref 135–145)
Total Bilirubin: 1 mg/dL (ref 0.3–1.2)
Total Protein: 7.4 g/dL (ref 6.5–8.1)

## 2020-05-20 LAB — CBC WITH DIFFERENTIAL/PLATELET
Abs Immature Granulocytes: 0.05 10*3/uL (ref 0.00–0.07)
Basophils Absolute: 0 10*3/uL (ref 0.0–0.1)
Basophils Relative: 0 %
Eosinophils Absolute: 0.2 10*3/uL (ref 0.0–0.5)
Eosinophils Relative: 2 %
HCT: 42.1 % (ref 39.0–52.0)
Hemoglobin: 13.4 g/dL (ref 13.0–17.0)
Immature Granulocytes: 1 %
Lymphocytes Relative: 25 %
Lymphs Abs: 2.5 10*3/uL (ref 0.7–4.0)
MCH: 31.7 pg (ref 26.0–34.0)
MCHC: 31.8 g/dL (ref 30.0–36.0)
MCV: 99.5 fL (ref 80.0–100.0)
Monocytes Absolute: 1 10*3/uL (ref 0.1–1.0)
Monocytes Relative: 10 %
Neutro Abs: 6.1 10*3/uL (ref 1.7–7.7)
Neutrophils Relative %: 62 %
Platelets: 281 10*3/uL (ref 150–400)
RBC: 4.23 MIL/uL (ref 4.22–5.81)
RDW: 13.3 % (ref 11.5–15.5)
WBC: 9.8 10*3/uL (ref 4.0–10.5)
nRBC: 0 % (ref 0.0–0.2)

## 2020-05-20 LAB — PSA: Prostatic Specific Antigen: 0.41 ng/mL (ref 0.00–4.00)

## 2020-05-20 NOTE — Progress Notes (Signed)
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 The report date is 03/15/2020.   The Common Hereditary Cancers Panel offered by [facility] includes sequencing and/or deletion duplication testing of the following 47 genes: APC, ATM, AXIN2, BARD1, BMPR1A, BRCA1, BRCA2, BRIP1, CDH1, CDK4, CDKN2A (p14ARF), CDKN2A (p16INK4a), CHEK2, CTNNA1, DICER1, EPCAM (Deletion/duplication testing only), GREM1 (promoter region deletion/duplication testing only), KIT, MEN1, MLH1, MSH2, MSH3, MSH6, MUTYH, NBN, NF1, NTHL1, PALB2, PDGFRA, PMS2, POLD1, POLE, PTEN, RAD50, RAD51C, RAD51D, SDHB, SDHC, SDHD, SMAD4, SMARCA4. STK11, TP53, TSC1, TSC2, and VHL.  The following genes were evaluated for sequence changes only: SDHA and HOXB13 c.251G>A variant only. The Prostate Cancer HRR Panel offered by [facility] includes sequencing and/or deletion/duplication analysis of the following 10 genes: ATM, BARD1, BRCA1, BRCA2, BRIP1, CHEK2, FANCL, PALB2, RAD51C, RAD51D.     CANCER STAGING: Cancer Staging No matching staging information was found for the patient.  INTERVAL HISTORY:  Randall Dawson, a 59 y.o. male, returns for routine follow-up of his prostate cancer.  He is seen for follow-up of prostate cancer.  Appetite is 100%.  Energy levels are 90%.  Tolerating Zytiga very well.  I have increased  his prednisone to 5 mg twice daily at last visit.  His chest pains have improved.  He had only 2 episodes of chest pain in the last 4 weeks.  Last episode was 1 week ago.  Sometimes pain is sharp.  Last pain improved with rest.  Reports some hot flashes.  Denies any nausea or vomiting.   REVIEW OF SYSTEMS:  Review of Systems  Constitutional: Negative for appetite change.  Cardiovascular: Positive for chest pain (sharp, mid-chest, non-radiating). Negative for leg swelling.  Neurological: Positive for dizziness.  All other systems reviewed and are negative.   PAST MEDICAL/SURGICAL HISTORY:  Past Medical History:  Diagnosis Date  . Family history of lung cancer   . Family history of lymphoma   . Family history of prostate cancer   . History of hepatitis B 01/14/2020  . Prostate cancer [facility])    Past Surgical History:  Procedure Laterality Date  . FOREIGN BODY REMOVAL N/A 07/13/2017   Procedure: FOREIGN BODY REMOVAL ADULT;  Surgeon: Helayne Seminole, MD;  Location: [facility];  Service: ENT;  Laterality: N/A;  . LEG SURGERY     rods in leg from fracture  . RIGID ESOPHAGOSCOPY N/A 07/13/2017   Procedure: RIGID ESOPHAGOSCOPY;  Surgeon: Helayne Seminole, MD;  Location: [facility] OR;  Service: ENT;  Laterality: N/A;    SOCIAL HISTORY:  Social History   Socioeconomic History  . Marital status: Divorced    Spouse name: Not on file  . Number of children: 0  . Years of education: Not on file  . Highest education level: Not on file  Occupational History  . Occupation: EMPLOYED  Tobacco Use  . Smoking status: Never Smoker  . Smokeless  tobacco: Never Used  Vaping Use  . Vaping Use: Never used  Substance and Sexual Activity  . Alcohol use: No  . Drug use: No  . Sexual activity: Not Currently  Other Topics Concern  . Not on file  Social History Narrative  . Not on file   Social Determinants of Health   Financial Resource Strain: Medium Risk  . Difficulty of Paying Living Expenses:  Somewhat hard  Food Insecurity: Food Insecurity Present  . Worried About Charity fundraiser in the Last Year: Sometimes true  . Ran Out of Food in the Last Year: Sometimes true  Transportation Needs: No Transportation Needs  . Lack of Transportation (Medical): No  . Lack of Transportation (Non-Medical): No  Physical Activity: Inactive  . Days of Exercise per Week: 0 days  . Minutes of Exercise per Session: 0 min  Stress: Stress Concern Present  . Feeling of Stress : To some extent  Social Connections: Moderately Isolated  . Frequency of Communication with Friends and Family: Twice a week  . Frequency of Social Gatherings with Friends and Family: Twice a week  . Attends Religious Services: More than 4 times per year  . Active Member of Clubs or Organizations: No  . Attends Archivist Meetings: Never  . Marital Status: Divorced  Human resources officer Violence: Not At Risk  . Fear of Current or Ex-Partner: No  . Emotionally Abused: No  . Physically Abused: No  . Sexually Abused: No    FAMILY HISTORY:  Family History  Problem Relation Age of Onset  . Dementia Mother   . Prostate cancer Father 53       metastatic  . Cancer Sister        cancer on leg, dx. in her 61s  . Lymphoma Brother 52  . Lung cancer Paternal Aunt        dx. >50    CURRENT MEDICATIONS:  Current Outpatient Medications  Medication Sig Dispense Refill  . abiraterone acetate (ZYTIGA) 250 MG tablet Take 4 tablets (1,000 mg total) by mouth daily. Take on an empty stomach 1 hour before or 2 hours after a meal 120 tablet 11  . predniSONE (DELTASONE) 5 MG tablet Take 1 tablet (5 mg total) by mouth 2 (two) times daily with a meal. 60 tablet 11   No current facility-administered medications for this visit.    ALLERGIES:  No Known Allergies  PHYSICAL EXAM:  Performance status (ECOG): 0 - Asymptomatic  Vitals:   05/20/20 1032  BP: (!) 141/89  Pulse: 69  Resp: 18  Temp: (!) 97.2 F (36.2 C)  SpO2:  100%   Wt Readings from Last 3 Encounters:  05/20/20 233 lb 3.2 oz (105.8 kg)  04/20/20 232 lb 6.4 oz (105.4 kg)  03/18/20 227 lb 11.2 oz (103.3 kg)   Physical Exam Vitals reviewed.  Constitutional:      Appearance: Normal appearance. He is obese.  Cardiovascular:     Rate and Rhythm: Normal rate and regular rhythm.     Pulses: Normal pulses.     Heart sounds: Normal heart sounds.  Pulmonary:     Effort: Pulmonary effort is normal.     Breath sounds: Normal breath sounds.  Neurological:     General: No focal deficit present.     Mental Status: He is alert and oriented to person, place, and time.  Psychiatric:        Mood and Affect: Mood normal.  Behavior: Behavior normal.      LABORATORY DATA:  I have reviewed the labs as listed.  CBC Latest Ref Rng & Units 05/20/2020 04/20/2020 03/18/2020  WBC 4.0 - 10.5 K/uL 9.8 6.6 6.0  Hemoglobin 13.0 - 17.0 g/dL 13.4 12.5(L) 13.0  Hematocrit 39 - 52 % 42.1 38.4(L) 39.4  Platelets 150 - 400 K/uL 281 254 256   CMP Latest Ref Rng & Units 05/20/2020 04/20/2020 03/18/2020  Glucose 70 - 99 mg/dL 90 113(H) 112(H)  BUN 6 - 20 mg/dL $Remove'17 15 16  'GWIazev$ Creatinine 0.61 - 1.24 mg/dL 0.80 0.82 0.86  Sodium 135 - 145 mmol/L 137 138 138  Potassium 3.5 - 5.1 mmol/L 4.0 3.9 4.3  Chloride 98 - 111 mmol/L 102 103 103  CO2 22 - 32 mmol/L $RemoveB'28 27 27  'pyBEtOpC$ Calcium 8.9 - 10.3 mg/dL 9.3 9.1 9.2  Total Protein 6.5 - 8.1 g/dL 7.4 6.9 7.3  Total Bilirubin 0.3 - 1.2 mg/dL 1.0 0.6 0.7  Alkaline Phos 38 - 126 U/L 71 63 65  AST 15 - 41 U/L $Remo'18 20 17  'SxVIo$ ALT 0 - 44 U/L $Remo'14 15 13   'IZwqD$ Lab Results  Component Value Date   LDH 167 04/20/2020   LDH 145 02/17/2020    DIAGNOSTIC IMAGING:  I have independently reviewed the scans and discussed with the patient. No results found.   ASSESSMENT:  1. 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 Serum  testosterone on 12/17/2019 of 197. -B[facility] scan on 12/18/2019 at [facility] shows small focus of uptake seen left inferior orbit favoring benign etiology. No suspicious foci of bone meta stasis. -CTAP with contrast on 12/18/2019 showed retroperitoneal and bilateral iliac chain metastatic adenopathy. Left para-aortic lymph node measures 15 mm. Right external iliac lymph node measures 22 x 35 mm. Left external iliac lymph node measures 26 x 35 mm. Ill-defined hypoenhancing 12 mm hepatic lesion, indeterminate, however concerning for metastasis. Sclerotic focus in the right posterior ilium, indeterminate. -MRI of the abdomen on 01/10/2020 shows tiny 8 mm low-attenuation lesion in the posterior right hepatic lobe, too small to characterize. No other significant abnormalities. -Abiraterone and prednisone started around 02/01/2020. Degarelix started on 01/29/2020. -Lupron 45 mg on 02/26/2020.  2. Family history: -Father died of prostate cancer at age 68. Sister had cancer on her leg resected. Paternal uncle had cancer, patient does not know the type.   PLAN:  1. Metastatic castration sensitive prostate cancer: -Continue abiraterone 1000 mg daily.  Continue prednisone 5 mg twice daily. -PSA improved to 0.41 today. -Repeat blood pressure was 135/86.  Potassium is 4.0.  He is not on any blood pressure medications.  I have recommended him to check his blood pressure at home. -RTC 2 months with labs.  2. Family history: -Negative for germline mutation by [facility] testing.  3. Left hip pain: -This has improved.  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  4.  Chest pain: -He had some chest pain and discomfort in the last couple of months.  In the last month, he had 2 episodes.  Chest pain can happen during exertion or at rest. -I have made a consultation with cardiology for possible stress test.  He has an appointment coming up.  If it is severe, he was advised to go to ER.   Orders placed this  encounter:  No orders of the defined types were placed in this encounter.    Dirk Dress  Delton Coombes, [place] [phone]   I, Milinda Antis, am acting as a scribe for Dr. Sanda Linger.  I, Derek Jack MD, have reviewed the above documentation for accuracy and completeness, and I agree with the above.

## 2020-05-25 ENCOUNTER — Other Ambulatory Visit: Payer: Self-pay

## 2020-05-25 ENCOUNTER — Encounter: Payer: Self-pay | Admitting: Emergency Medicine

## 2020-05-25 ENCOUNTER — Ambulatory Visit
Admission: EM | Admit: 2020-05-25 | Discharge: 2020-05-25 | Disposition: A | Discharge status: Home / Self Care | Payer: 59 | Attending: Emergency Medicine | Admitting: Emergency Medicine

## 2020-05-25 ENCOUNTER — Other Ambulatory Visit (HOSPITAL_COMMUNITY): Payer: Self-pay | Admitting: *Deleted

## 2020-05-25 DIAGNOSIS — J069 Acute upper respiratory infection, unspecified: Secondary | ICD-10-CM

## 2020-05-25 DIAGNOSIS — R112 Nausea with vomiting, unspecified: Secondary | ICD-10-CM | POA: Diagnosis not present

## 2020-05-25 MED ORDER — PROCHLORPERAZINE MALEATE 10 MG PO TABS: 10.0000 mg | ORAL_TABLET | Freq: Four times a day (QID) | ORAL | 2 refills | Status: DC | PRN

## 2020-05-25 MED ORDER — ONDANSETRON 4 MG PO TBDP: 4.0000 mg | ORAL_TABLET | Freq: Three times a day (TID) | ORAL | 0 refills | Status: DC | PRN

## 2020-05-25 MED ORDER — BENZONATATE 100 MG PO CAPS: 100.0000 mg | ORAL_CAPSULE | Freq: Three times a day (TID) | ORAL | 0 refills | Status: DC

## 2020-05-25 MED ORDER — AZITHROMYCIN 250 MG PO TABS: 250.0000 mg | ORAL_TABLET | Freq: Every day | ORAL | 0 refills | Status: DC

## 2020-05-25 NOTE — Telephone Encounter (Signed)
Patient's wife called stating that she has been sick with "head cold" for several days. She states that Pavan now has same symptoms.  He has cough, congestion, runny nose, sneezing, and some nausea that he can't get under control with over the counter medications.  I have sent some compazine to their pharmacy and explained the instructions.  Wife verbalizes understanding.  She states that she is taking him to the urgent care for help with the cold.  I advised her that if she does take him, to make sure the physician knows that he is on chemotherapy medications for his prostate cancer.  She verbalizes understanding.

## 2020-05-25 NOTE — Discharge Instructions (Addendum)
Tessalon Perles prescribed for cough Azithromycin was prescribed Zofran was prescribed for nausea Use medications daily for symptom relief Use OTC medications like ibuprofen or tylenol as needed fever or pain Call or go to the ED if you have any new or worsening symptoms such as fever, worsening cough, shortness of breath, chest tightness, chest pain, turning blue, changes in mental status, etc..Marland Kitchen

## 2020-05-25 NOTE — ED Provider Notes (Signed)
[facility]   [id number] 05/25/20 Arrival Time: 5956   CC:URI  SUBJECTIVE: History from: patient.  Randall Dawson is a 59 y.o. male who presented to the urgent care for complaint of headache, cough, congestion with green nasal discharge for the past 3 days.  Report he has vomited 5-6 times this morning.  Has tested negative for COVID-19 a week ago..  Denies sick exposure to COVID, flu or strep.  Denies recent travel.  Has tried OTC medication without relief.  Denies aggravating factors.  Denies previous symptoms in the past.   Denies fever, chills, fatigue, sinus pain, rhinorrhea, sore throat, SOB, wheezing, chest pain, nausea, changes in bowel or bladder habits.      ROS: As per HPI.  All other pertinent ROS negative.     Past Medical History:  Diagnosis Date  . Family history of lung cancer   . Family history of lymphoma   . Family history of prostate cancer   . History of hepatitis B 01/14/2020  . Prostate cancer [facility])    Past Surgical History:  Procedure Laterality Date  . FOREIGN BODY REMOVAL N/A 07/13/2017   Procedure: FOREIGN BODY REMOVAL ADULT;  Surgeon: Helayne Seminole, MD;  Location: [facility];  Service: ENT;  Laterality: N/A;  . LEG SURGERY     rods in leg from fracture  . RIGID ESOPHAGOSCOPY N/A 07/13/2017   Procedure: RIGID ESOPHAGOSCOPY;  Surgeon: Helayne Seminole, MD;  Location: [facility] OR;  Service: ENT;  Laterality: N/A;   No Known Allergies No current facility-administered medications on file prior to encounter.   Current Outpatient Medications on File Prior to Encounter  Medication Sig Dispense Refill  . abiraterone acetate (ZYTIGA) 250 MG tablet Take 4 tablets (1,000 mg total) by mouth daily. Take on an empty stomach 1 hour before or 2 hours after a meal 120 tablet 11  . predniSONE (DELTASONE) 5 MG tablet Take 1 tablet (5 mg total) by mouth 2 (two) times daily with a meal. 60 tablet 11  . prochlorperazine (COMPAZINE) 10 MG tablet Take 1  tablet (10 mg total) by mouth every 6 (six) hours as needed for nausea or vomiting. 60 tablet 2   Social History   Socioeconomic History  . Marital status: Divorced    Spouse name: Not on file  . Number of children: 0  . Years of education: Not on file  . Highest education level: Not on file  Occupational History  . Occupation: EMPLOYED  Tobacco Use  . Smoking status: Never Smoker  . Smokeless tobacco: Never Used  Vaping Use  . Vaping Use: Never used  Substance and Sexual Activity  . Alcohol use: No  . Drug use: No  . Sexual activity: Not Currently  Other Topics Concern  . Not on file  Social History Narrative  . Not on file   Social Determinants of Health   Financial Resource Strain: Medium Risk  . Difficulty of Paying Living Expenses: Somewhat hard  Food Insecurity: Food Insecurity Present  . Worried About Charity fundraiser in the Last Year: Sometimes true  . Ran Out of Food in the Last Year: Sometimes true  Transportation Needs: No Transportation Needs  . Lack of Transportation (Medical): No  . Lack of Transportation (Non-Medical): No  Physical Activity: Inactive  . Days of Exercise per Week: 0 days  . Minutes of Exercise per Session: 0 min  Stress: Stress Concern Present  . Feeling of Stress : To some extent  Social Connections: Moderately Isolated  . Frequency of Communication with Friends and Family: Twice a week  . Frequency of Social Gatherings with Friends and Family: Twice a week  . Attends Religious Services: More than 4 times per year  . Active Member of Clubs or Organizations: No  . Attends Archivist Meetings: Never  . Marital Status: Divorced  Human resources officer Violence: Not At Risk  . Fear of Current or Ex-Partner: No  . Emotionally Abused: No  . Physically Abused: No  . Sexually Abused: No   Family History  Problem Relation Age of Onset  . Dementia Mother   . Prostate cancer Father 35       metastatic  . Cancer Sister         cancer on leg, dx. in her 87s  . Lymphoma Brother 91  . Lung cancer Paternal Aunt        dx. >50    OBJECTIVE:  Vitals:   05/25/20 0911  BP: (!) 139/92  Pulse: 100  Resp: 19  Temp: 98.1 F (36.7 C)  TempSrc: Oral  SpO2: 95%  Weight: 233 lb (105.7 kg)  Height: 5\' 9"  (1.753 m)     General appearance: alert; appears fatigued, but nontoxic; speaking in full sentences and tolerating own secretions HEENT: NCAT; Ears: EACs clear, TMs pearly gray; Eyes: PERRL.  EOM grossly intact. Sinuses: nontender; Nose: nares patent without rhinorrhea, Throat: oropharynx clear, tonsils non erythematous or enlarged, uvula midline  Neck: supple without LAD Lungs: unlabored respirations, symmetrical air entry; cough: mild; no respiratory distress; CTAB Heart: regular rate and rhythm.  Radial pulses 2+ symmetrical bilaterally Skin: warm and dry Psychological: alert and cooperative; normal mood and affect  LABS:  No results found for this or any previous visit (from the past 24 hour(s)).   ASSESSMENT & PLAN:  1. Acute upper respiratory infection   2. Non-intractable vomiting with nausea, unspecified vomiting type     Meds ordered this encounter  Medications  . azithromycin (ZITHROMAX) 250 MG tablet    Sig: Take 1 tablet (250 mg total) by mouth daily. Take first 2 tablets together, then 1 every day until finished.    Dispense:  6 tablet    Refill:  0  . benzonatate (TESSALON) 100 MG capsule    Sig: Take 1 capsule (100 mg total) by mouth every 8 (eight) hours.    Dispense:  21 capsule    Refill:  0  . ondansetron (ZOFRAN ODT) 4 MG disintegrating tablet    Sig: Take 1 tablet (4 mg total) by mouth every 8 (eight) hours as needed for nausea or vomiting.    Dispense:  20 tablet    Refill:  0    Discharge e instructions  Tessalon Perles prescribed for cough Azithromycin was prescribed Zofran was prescribed for nausea Use medications daily for symptom relief Use OTC medications like  ibuprofen or tylenol as needed fever or pain Call or go to the ED if you have any new or worsening symptoms such as fever, worsening cough, shortness of breath, chest tightness, chest pain, turning blue, changes in mental status, etc...   Reviewed expectations re: course of current medical issues. Questions answered. Outlined signs and symptoms indicating need for more acute intervention. Patient verbalized understanding. After Visit Summary given.         Emerson Monte, FNP 05/25/20 1004

## 2020-05-25 NOTE — ED Triage Notes (Addendum)
Headache, congestion and cough x 3 days. vomited 5-6 times this morning.  Had neg covid test x 1 week ago

## 2020-06-12 ENCOUNTER — Encounter: Payer: Self-pay | Admitting: Cardiology

## 2020-06-12 ENCOUNTER — Telehealth: Payer: Self-pay | Admitting: Cardiology

## 2020-06-12 ENCOUNTER — Encounter: Payer: Self-pay | Admitting: *Deleted

## 2020-06-12 ENCOUNTER — Ambulatory Visit (INDEPENDENT_AMBULATORY_CARE_PROVIDER_SITE_OTHER): Payer: 59 | Admitting: Cardiology

## 2020-06-12 VITALS — BP 138/100 | HR 84 | Ht 69.0 in | Wt 237.2 lb

## 2020-06-12 DIAGNOSIS — C772 Secondary and unspecified malignant neoplasm of intra-abdominal lymph nodes: Secondary | ICD-10-CM

## 2020-06-12 DIAGNOSIS — C61 Malignant neoplasm of prostate: Secondary | ICD-10-CM

## 2020-06-12 DIAGNOSIS — R030 Elevated blood-pressure reading, without diagnosis of hypertension: Secondary | ICD-10-CM | POA: Diagnosis not present

## 2020-06-12 DIAGNOSIS — R079 Chest pain, unspecified: Secondary | ICD-10-CM

## 2020-06-12 NOTE — Patient Instructions (Addendum)
Medication Instructions:   Your physician recommends that you continue on your current medications as directed. Please refer to the Current Medication list given to you today.  Labwork:  None  Testing/Procedures: Your physician has requested that you have a lexiscan myoview. For further information please visit [email or web address]. Please follow instruction sheet, as given.  Follow-Up:  Your physician recommends that you schedule a follow-up appointment in: pending.  Any Other Special Instructions Will Be Listed Below (If Applicable).  If you need a refill on your cardiac medications before your next appointment, please call your pharmacy.

## 2020-06-12 NOTE — Progress Notes (Signed)
Cardiology Office Note  Date: 06/12/2020   ID: Randall Dawson, DOB Sep 01, 1960, MRN [id number]  PCP:  Glenda Chroman, MD  Cardiologist:  Rozann Lesches, MD Electrophysiologist:  None   Chief Complaint  Patient presents with  . History of chest pain    History of Present Illness: Randall Dawson is a 59 y.o. male referred for cardiology consultation by Dr. Delton Coombes for evaluation of chest pain.  He is being treated for metastatic prostate cancer and has mentioned symptoms during assessment over the last few months.  He is being treated with Zytiga and prednisone.  He tells me that he has had 3 episodes of chest tightness culminating in a sharp sensation, lasts anywhere from 5 to 10 minutes, not precipitated by any strenuous activity.  These episodes have occurred over the last 4 months, he has not had any for the last month.  He thought that they may have been reflux, did take Tums, but not certain whether this actually helped or not.  He reports weakness in his hips and ankles, is not able to walk quite as far as he used to due to the limitations, but not specifically more short of breath.  I reviewed his medications which are outlined below.  His blood pressure is elevated, he has no standing history of hypertension.  I personally reviewed his ECG today which shows normal sinus rhythm.  Past Medical History:  Diagnosis Date  . Family history of lung cancer   . Family history of lymphoma   . Family history of prostate cancer   . History of hepatitis B 01/14/2020  . 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 FOREIGN BODY REMOVAL N/A 07/13/2017   Procedure: FOREIGN BODY REMOVAL ADULT;  Surgeon: Helayne Seminole, MD;  Location: [facility];  Service: ENT;  Laterality: N/A;  . LEG SURGERY     rods in leg from fracture  . RIGID ESOPHAGOSCOPY N/A 07/13/2017   Procedure: RIGID  ESOPHAGOSCOPY;  Surgeon: Helayne Seminole, MD;  Location: [facility] OR;  Service: ENT;  Laterality: N/A;    Current Outpatient Medications  Medication Sig Dispense Refill  . abiraterone acetate (ZYTIGA) 250 MG tablet Take 4 tablets (1,000 mg total) by mouth daily. Take on an empty stomach 1 hour before or 2 hours after a meal 120 tablet 11  . ondansetron (ZOFRAN ODT) 4 MG disintegrating tablet Take 1 tablet (4 mg total) by mouth every 8 (eight) hours as needed for nausea or vomiting. 20 tablet 0  . predniSONE (DELTASONE) 5 MG tablet Take 1 tablet (5 mg total) by mouth 2 (two) times daily with a meal. 60 tablet 11  . prochlorperazine (COMPAZINE) 10 MG tablet Take 1 tablet (10 mg total) by mouth every 6 (six) hours as needed for nausea or vomiting. 60 tablet 2   No current facility-administered medications for this visit.   Allergies:  Patient has no known allergies.   Social History: The patient  reports that he has never smoked. He has never used smokeless tobacco. He reports that he does not drink alcohol and does not use drugs.   Family History: The patient's family history includes Cancer in his sister; Dementia in his mother; Lung cancer in his paternal aunt; Lymphoma (age of onset: 84) in his brother; Prostate cancer (age of onset: 80) in his father.   ROS: No palpitations or syncope.  Physical Exam: VS:  BP (!) 138/100   Pulse 84   Ht 5\' 9"  (1.753 m)   Wt 237 lb 3.2 oz (107.6 kg)   SpO2 98%   BMI 35.03 kg/m , BMI Body mass index is 35.03 kg/m.  Wt Readings from Last 3 Encounters:  06/12/20 237 lb 3.2 oz (107.6 kg)  05/25/20 233 lb (105.7 kg)  05/20/20 233 lb 3.2 oz (105.8 kg)    General: Patient appears comfortable at rest. HEENT: Conjunctiva and lids normal, wearing a mask. Neck: Supple, no elevated JVP or carotid bruits, no thyromegaly. Lungs: Clear to auscultation, nonlabored breathing at rest. Cardiac: Regular rate and rhythm, no S3 or significant systolic murmur, no  pericardial rub. Abdomen: Soft, nontender, bowel sounds present. Extremities: No pitting edema, distal pulses 2+. Skin: Warm and dry. Musculoskeletal: No kyphosis. Neuropsychiatric: Alert and oriented x3, affect grossly appropriate.  ECG:  No tracings for review today.  Recent Labwork: 03/18/2020: Magnesium 2.0 05/20/2020: ALT 14; AST 18; BUN 17; Creatinine, Ser 0.80; Hemoglobin 13.4; Platelets 281; Potassium 4.0; Sodium 137   Other Studies Reviewed Today:  No prior cardiac testing for review.  Assessment and Plan:  1.  Recurrent chest pain in a 59 year old male undergoing active treatment for metastatic prostate cancer with antiandrogen and steroids.  He does not report any history of hypertension, but recent blood pressure measurements have been elevated.  No obvious family history of premature CAD.  Screening ECG today is normal.  He does have functional limitations related to leg weakness and hip pain.  Plan is to obtain a [facility] for formal ischemic evaluation.  2.  Elevated blood pressure readings.  Recommended that he follow-up with Dr. Woody Seller in case he needs to initiate antihypertensive therapy.  3.  Prostate cancer metastatic to intra-abdominal lymph nodes.  He is currently on Zytiga and prednisone, recent PSA normal down from 130 five months ago.  Medication Adjustments/Labs and Tests Ordered: Current medicines are reviewed at length with the patient today.  Concerns regarding medicines are outlined above.   Tests Ordered: Orders Placed This Encounter  Procedures  . NM Myocar Multi W/Spect W/Wall Motion / EF  . EKG 12-Lead    Medication Changes: No orders of the defined types were placed in this encounter.   Disposition:  Follow up test results.  Signed, Satira Sark, MD, [facility] 06/12/2020 10:58 AM    [facility] [place], [place], [place] [address] Phone: [phone]; Fax: [phone]

## 2020-06-12 NOTE — Telephone Encounter (Signed)
Pre-cert Verification for the following procedure     LEXISCAN MYOVIEW    DATE:06/24/2020  LOCATION:  [facility]

## 2020-06-17 ENCOUNTER — Ambulatory Visit (HOSPITAL_COMMUNITY): Payer: 59 | Admitting: Hematology

## 2020-06-17 ENCOUNTER — Other Ambulatory Visit (HOSPITAL_COMMUNITY): Payer: 59

## 2020-06-24 ENCOUNTER — Other Ambulatory Visit (HOSPITAL_COMMUNITY): Payer: 59

## 2020-06-24 ENCOUNTER — Encounter (HOSPITAL_COMMUNITY): Payer: 59

## 2020-06-30 ENCOUNTER — Telehealth: Payer: Self-pay | Admitting: Oncology

## 2020-06-30 ENCOUNTER — Other Ambulatory Visit (HOSPITAL_COMMUNITY): Payer: Self-pay

## 2020-06-30 MED ORDER — BUTALBITAL-APAP-CAFFEINE 50-325-40 MG PO TABS
1.0000 | ORAL_TABLET | Freq: Four times a day (QID) | ORAL | 0 refills | Status: DC | PRN
Start: 2020-06-30 — End: 2021-06-29

## 2020-06-30 NOTE — Telephone Encounter (Signed)
Re: Headaches  Per patient and caregiver he has hx of migraines.   Last migraine was approximately 2 days ago.  Has migraines approximately 2 times per week.  Has tried Tylenol without relief.  Has been taking Zofran with relief of nausea symptoms.  Symptoms started at 3 AM this morning.  Describes severe headache with light sensitivity.  Also having nausea.  Patient can try Fioricet 1 to 2 tablets every 6-8 hours until relief of symptoms.  If symptoms do not improve, patient would likely need MRI of his brain.  I also explained that if symptoms worsen this evening, patient needs to go directly to the emergency room for further evaluation.  Faythe Casa, NP 06/30/2020 5:04 PM

## 2020-07-20 NOTE — Progress Notes (Signed)
Randall Dawson, [place] [address]   CLINIC:  Medical Oncology/Hematology  PCP:  Glenda Chroman, MD [address] 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 The report date is 03/15/2020.   The Common Hereditary Cancers Panel offered by [facility] includes sequencing and/or deletion duplication testing of the following 47 genes: APC, ATM, AXIN2, BARD1, BMPR1A, BRCA1, BRCA2, BRIP1, CDH1, CDK4, CDKN2A (p14ARF), CDKN2A (p16INK4a), CHEK2, CTNNA1, DICER1, EPCAM (Deletion/duplication testing only), GREM1 (promoter region deletion/duplication testing only), KIT, MEN1, MLH1, MSH2, MSH3, MSH6, MUTYH, NBN, NF1, NTHL1, PALB2, PDGFRA, PMS2, POLD1, POLE, PTEN, RAD50, RAD51C, RAD51D, SDHB, SDHC, SDHD, SMAD4, SMARCA4. STK11, TP53, TSC1, TSC2, and VHL.  The following genes were evaluated for sequence changes only: SDHA and HOXB13 c.251G>A variant only. The Prostate Cancer HRR Panel offered by [facility] includes sequencing and/or deletion/duplication analysis of the following 10 genes: ATM, BARD1, BRCA1, BRCA2, BRIP1, CHEK2, FANCL, PALB2, RAD51C, RAD51D.     CANCER STAGING: Cancer Staging No matching staging information was found for the patient.  INTERVAL HISTORY:  [name], a 59 y.o. male, returns for routine follow-up of his prostate cancer. Randall Dawson was last seen on 05/20/2020.   Today he reports that his bilateral hip pains have slightly worsened.  Initially he only had  left-sided hip pain.  Now his right hip also hurts.  Left hip hurts more.  Appetite is 100%.  Energy levels are 50%.  Mild hot flashes present.  REVIEW OF SYSTEMS:  Review of Systems  Neurological: Positive for headaches.  All other systems reviewed and are negative.   PAST MEDICAL/SURGICAL HISTORY:  Past Medical History:  Diagnosis Date  . Family history of lung cancer   . Family history of lymphoma   . Family history of prostate cancer   . History of hepatitis B 01/14/2020  . 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 FOREIGN BODY REMOVAL N/A 07/13/2017   Procedure: FOREIGN BODY REMOVAL ADULT;  Surgeon: Helayne Seminole, MD;  Location: [facility];  Service: ENT;  Laterality: N/A;  . LEG SURGERY     rods in leg from fracture  . RIGID ESOPHAGOSCOPY N/A 07/13/2017   Procedure: RIGID ESOPHAGOSCOPY;  Surgeon: Helayne Seminole, MD;  Location: [facility] OR;  Service: ENT;  Laterality: N/A;    SOCIAL HISTORY:  Social History   Socioeconomic History  . Marital status: Divorced    Spouse name: Not on file  . Number of children: 0  . Years of education: Not on file  . Highest education level: Not on file  Occupational History  . Occupation: EMPLOYED  Tobacco Use  . Smoking status: Never Smoker  . Smokeless tobacco: Never Used  Vaping Use  . Vaping Use: Never used  Substance and Sexual Activity  . Alcohol use: No  . Drug use: No  . Sexual activity: Not on file  Other Topics Concern  . Not on file  Social History Narrative  .  Not on file   Social Determinants of Health   Financial Resource Strain: Medium Risk  . Difficulty of Paying Living Expenses: Somewhat hard  Food Insecurity: Food Insecurity Present  . Worried About Charity fundraiser in the Last Year: Sometimes true  . Ran Out of Food in the Last Year: Sometimes true  Transportation Needs: No Transportation Needs  . Lack of Transportation  (Medical): No  . Lack of Transportation (Non-Medical): No  Physical Activity: Inactive  . Days of Exercise per Week: 0 days  . Minutes of Exercise per Session: 0 min  Stress: Stress Concern Present  . Feeling of Stress : To some extent  Social Connections: Moderately Isolated  . Frequency of Communication with Friends and Family: Twice a week  . Frequency of Social Gatherings with Friends and Family: Twice a week  . Attends Religious Services: More than 4 times per year  . Active Member of Clubs or Organizations: No  . Attends Archivist Meetings: Never  . Marital Status: Divorced  Human resources officer Violence: Not At Risk  . Fear of Current or Ex-Partner: No  . Emotionally Abused: No  . Physically Abused: No  . Sexually Abused: No    FAMILY HISTORY:  Family History  Problem Relation Age of Onset  . Dementia Mother   . Prostate cancer Father 29       metastatic  . Cancer Sister        cancer on leg, dx. in her 13s  . Lymphoma Brother 13  . Lung cancer Paternal Aunt        dx. >50    CURRENT MEDICATIONS:  Current Outpatient Medications  Medication Sig Dispense Refill  . abiraterone acetate (ZYTIGA) 250 MG tablet Take 4 tablets (1,000 mg total) by mouth daily. Take on an empty stomach 1 hour before or 2 hours after a meal 120 tablet 11  . butalbital-acetaminophen-caffeine (FIORICET) 50-325-40 MG tablet Take 1-2 tablets by mouth every 6 (six) hours as needed for headache. 20 tablet 0  . ondansetron (ZOFRAN ODT) 4 MG disintegrating tablet Take 1 tablet (4 mg total) by mouth every 8 (eight) hours as needed for nausea or vomiting. 20 tablet 0  . predniSONE (DELTASONE) 5 MG tablet Take 1 tablet (5 mg total) by mouth 2 (two) times daily with a meal. 60 tablet 11  . prochlorperazine (COMPAZINE) 10 MG tablet Take 1 tablet (10 mg total) by mouth every 6 (six) hours as needed for nausea or vomiting. 60 tablet 2   No current facility-administered medications for this visit.     ALLERGIES:  No Known Allergies  PHYSICAL EXAM:  Performance status (ECOG): 0 - Asymptomatic  There were no vitals filed for this visit. Wt Readings from Last 3 Encounters:  06/12/20 237 lb 3.2 oz (107.6 kg)  05/25/20 233 lb (105.7 kg)  05/20/20 233 lb 3.2 oz (105.8 kg)   Physical Exam Vitals reviewed.  Constitutional:      Appearance: Normal appearance.  Cardiovascular:     Rate and Rhythm: Normal rate and regular rhythm.     Heart sounds: Normal heart sounds.  Pulmonary:     Effort: Pulmonary effort is normal.     Breath sounds: Normal breath sounds.  Abdominal:     General: There is no distension.     Palpations: Abdomen is soft.  Musculoskeletal:        General: No swelling.  Skin:    General: Skin is warm.  Neurological:  Mental Status: He is alert and oriented to person, place, and time.  Psychiatric:        Mood and Affect: Mood normal.        Behavior: Behavior normal.      LABORATORY DATA:  I have reviewed the labs as listed.  CBC Latest Ref Rng & Units 05/20/2020 04/20/2020 03/18/2020  WBC 4.0 - 10.5 K/uL 9.8 6.6 6.0  Hemoglobin 13.0 - 17.0 g/dL 13.4 12.5(L) 13.0  Hematocrit 39.0 - 52.0 % 42.1 38.4(L) 39.4  Platelets 150 - 400 K/uL 281 254 256   CMP Latest Ref Rng & Units 05/20/2020 04/20/2020 03/18/2020  Glucose 70 - 99 mg/dL 90 113(H) 112(H)  BUN 6 - 20 mg/dL _0 Creatinine 0.61 - 1.24 mg/dL 0.80 0.82 0.86  Sodium 135 - 145 mmol/L 137 138 138  Potassium 3.5 - 5.1 mmol/L 4.0 3.9 4.3  Chloride 98 - 111 mmol/L 102 103 103  CO2 22 - 32 mmol/L _1 Calcium 8.9 - 10.3 mg/dL 9.3 9.1 9.2  Total Protein 6.5 - 8.1 g/dL 7.4 6.9 7.3  Total Bilirubin 0.3 - 1.2 mg/dL 1.0 0.6 0.7  Alkaline Phos 38 - 126 U/L 71 63 65  AST 15 - 41 U/L _2 ALT 0 - 44 U/L _3 DIAGNOSTIC IMAGING:  I have independently reviewed the scans and discussed with the patient. No results found.   ASSESSMENT:  1. 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 Serum testosterone on 12/17/2019 of 197. -Bone scan on 12/18/2019 at [facility] shows small focus of uptake seen left inferior orbit favoring benign etiology. No suspicious foci of bone meta stasis. -CTAP with contrast on 12/18/2019 showed retroperitoneal and bilateral iliac chain metastatic adenopathy. Left para-aortic lymph node measures 15 mm. Right external iliac lymph node measures 22 x 35 mm. Left external iliac lymph node measures 26 x 35 mm. Ill-defined hypoenhancing 12 mm hepatic lesion, indeterminate, however concerning for metastasis. Sclerotic focus in the right posterior ilium, indeterminate. -MRI of the abdomen on 01/10/2020 shows tiny 8 mm low-attenuation lesion in the posterior right hepatic lobe, too small to characterize. No other significant abnormalities. -Abiraterone and prednisone started around 02/01/2020. Degarelix started on 01/29/2020. -Lupron 45 mg on 02/26/2020.  2. Family history: -Father died of prostate cancer at age 38. Sister had cancer on her leg resected. Paternal uncle had cancer, patient does not know the type.   PLAN:  1. Metastatic castration sensitive prostate cancer: -Continue Abiraterone 1000 mg daily and prednisone 5 mg twice daily. -PSA improved to 0.24 today. -Continue Lupron every 6 months. -Chemistries and CBC were grossly normal. -MRI of the hips with follow-up phone visit.  2. Family history: -Negative for germline mutation testing.  3.  Bilateral hip pains: -He reports bilateral hip pains, left more than right. -His PSA continues to trend down.  Hence I have recommended MRI of the hips.  4.  Chest pain: -He was evaluated by Dr. Domenic Polite and stress test recommended.  However his insurance did not approve.  He no longer has chest pains at this time.   Orders placed this encounter:  No orders of the  defined types were placed in this encounter.    Derek Jack, MD [place] [phone]   I, Milinda Antis, am acting as a scribe for Dr. Sanda Linger.  Kinnie Scales MD,  have reviewed the above documentation for accuracy and completeness, and I agree with the above.

## 2020-07-21 ENCOUNTER — Inpatient Hospital Stay (HOSPITAL_COMMUNITY): Payer: 59 | Attending: Hematology

## 2020-07-21 ENCOUNTER — Other Ambulatory Visit: Payer: Self-pay

## 2020-07-21 ENCOUNTER — Inpatient Hospital Stay (HOSPITAL_COMMUNITY): Payer: 59 | Admitting: Hematology

## 2020-07-21 VITALS — BP 130/80 | HR 70 | Temp 97.2°F | Resp 18 | Wt 241.1 lb

## 2020-07-21 DIAGNOSIS — R079 Chest pain, unspecified: Secondary | ICD-10-CM | POA: Diagnosis not present

## 2020-07-21 DIAGNOSIS — M25551 Pain in right hip: Secondary | ICD-10-CM | POA: Diagnosis not present

## 2020-07-21 DIAGNOSIS — C61 Malignant neoplasm of prostate: Secondary | ICD-10-CM | POA: Diagnosis not present

## 2020-07-21 DIAGNOSIS — Z806 Family history of leukemia: Secondary | ICD-10-CM | POA: Diagnosis not present

## 2020-07-21 DIAGNOSIS — C772 Secondary and unspecified malignant neoplasm of intra-abdominal lymph nodes: Secondary | ICD-10-CM | POA: Diagnosis not present

## 2020-07-21 DIAGNOSIS — Z801 Family history of malignant neoplasm of trachea, bronchus and lung: Secondary | ICD-10-CM | POA: Insufficient documentation

## 2020-07-21 DIAGNOSIS — M25552 Pain in left hip: Secondary | ICD-10-CM | POA: Diagnosis not present

## 2020-07-21 DIAGNOSIS — R232 Flushing: Secondary | ICD-10-CM | POA: Diagnosis not present

## 2020-07-21 DIAGNOSIS — Z79899 Other long term (current) drug therapy: Secondary | ICD-10-CM | POA: Diagnosis not present

## 2020-07-21 LAB — COMPREHENSIVE METABOLIC PANEL
ALT: 13 U/L (ref 0–44)
AST: 21 U/L (ref 15–41)
Albumin: 3.7 g/dL (ref 3.5–5.0)
Alkaline Phosphatase: 77 U/L (ref 38–126)
Anion gap: 7 (ref 5–15)
BUN: 18 mg/dL (ref 6–20)
CO2: 26 mmol/L (ref 22–32)
Calcium: 9 mg/dL (ref 8.9–10.3)
Chloride: 106 mmol/L (ref 98–111)
Creatinine, Ser: 0.82 mg/dL (ref 0.61–1.24)
GFR, Estimated: >60 mL/min (ref >60)
Glucose, Bld: 112 mg/dL — ABNORMAL HIGH (ref 70–99)
Potassium: 3.5 mmol/L (ref 3.5–5.1)
Sodium: 139 mmol/L (ref 135–145)
Total Bilirubin: 0.6 mg/dL (ref 0.3–1.2)
Total Protein: 6.9 g/dL (ref 6.5–8.1)

## 2020-07-21 LAB — CBC WITH DIFFERENTIAL/PLATELET
Abs Immature Granulocytes: 0.06 10*3/uL (ref 0.00–0.07)
Basophils Absolute: 0.1 10*3/uL (ref 0.0–0.1)
Basophils Relative: 1 %
Eosinophils Absolute: 0.2 10*3/uL (ref 0.0–0.5)
Eosinophils Relative: 2 %
HCT: 42 % (ref 39.0–52.0)
Hemoglobin: 13.3 g/dL (ref 13.0–17.0)
Immature Granulocytes: 1 %
Lymphocytes Relative: 26 %
Lymphs Abs: 2.2 10*3/uL (ref 0.7–4.0)
MCH: 32.4 pg (ref 26.0–34.0)
MCHC: 31.7 g/dL (ref 30.0–36.0)
MCV: 102.4 fL — ABNORMAL HIGH (ref 80.0–100.0)
Monocytes Absolute: 0.9 10*3/uL (ref 0.1–1.0)
Monocytes Relative: 11 %
Neutro Abs: 5 10*3/uL (ref 1.7–7.7)
Neutrophils Relative %: 59 %
Platelets: 278 10*3/uL (ref 150–400)
RBC: 4.1 MIL/uL — ABNORMAL LOW (ref 4.22–5.81)
RDW: 13.3 % (ref 11.5–15.5)
WBC: 8.3 10*3/uL (ref 4.0–10.5)
nRBC: 0 % (ref 0.0–0.2)

## 2020-07-21 LAB — PSA: Prostatic Specific Antigen: 0.24 ng/mL (ref 0.00–4.00)

## 2020-07-21 NOTE — Patient Instructions (Signed)
[facility] at [facility] Discharge Instructions  You were seen today by Dr. Katragadda. Follow up as scheduled.   Thank you for choosing [facility] at [facility] to provide your oncology and hematology care.  To afford each patient quality time with our provider, please arrive at least 15 minutes before your scheduled appointment time.   If you have a lab appointment with the Cancer Center please come in thru the Main Entrance and check in at the main information desk.  You need to re-schedule your appointment should you arrive 10 or more minutes late.  We strive to give you quality time with our providers, and arriving late affects you and other patients whose appointments are after yours.  Also, if you no show three or more times for appointments you may be dismissed from the clinic at the providers discretion.     Again, thank you for choosing [facility].  Our hope is that these requests will decrease the amount of time that you wait before being seen by our physicians.       _____________________________________________________________  Should you have questions after your visit to [facility], please contact our office at [phone] and follow the prompts.  Our office hours are 8:00 a.m. and 4:30 p.m. Monday - Friday.  Please note that voicemails left after 4:00 p.m. may not be returned until the following business day.  We are closed weekends and major holidays.  You do have access to a nurse 24-7, just call the main number to the clinic [phone] and do not press any options, hold on the line and a nurse will answer the phone.    For prescription refill requests, have your pharmacy contact our office and allow 72 hours.    Due to Covid, you will need to wear a mask upon entering the hospital. If you do not have a mask, a mask will be given to you at the Main Entrance upon arrival. For doctor visits, patients  may have 1 support person age 18 or older with them. For treatment visits, patients can not have anyone with them due to social distancing guidelines and our immunocompromised population.      

## 2020-07-31 ENCOUNTER — Telehealth: Payer: Self-pay | Admitting: Cardiology

## 2020-07-31 NOTE — Telephone Encounter (Signed)
noted 

## 2020-07-31 NOTE — Telephone Encounter (Signed)
I now have an approved auth for a pt. Can you assist with scheduling them? MRN# [id number].per Anisah Abdul-Razzaaq. Called patient to inform him of percert. He states that he doesn't want to schedule the stress test at this time.

## 2020-08-04 ENCOUNTER — Ambulatory Visit (HOSPITAL_COMMUNITY)
Admission: RE | Admit: 2020-08-04 | Discharge: 2020-08-04 | Disposition: A | Discharge status: Home / Self Care | Payer: 59 | Source: Ambulatory Visit | Attending: Hematology | Admitting: Hematology

## 2020-08-04 ENCOUNTER — Other Ambulatory Visit: Payer: Self-pay

## 2020-08-04 DIAGNOSIS — C772 Secondary and unspecified malignant neoplasm of intra-abdominal lymph nodes: Secondary | ICD-10-CM | POA: Diagnosis present

## 2020-08-04 DIAGNOSIS — C61 Malignant neoplasm of prostate: Secondary | ICD-10-CM | POA: Diagnosis present

## 2020-08-04 MED ORDER — GADOBUTROL 1 MMOL/ML IV SOLN
10.0000 mL | Freq: Once | INTRAVENOUS | Status: AC | PRN
  Administered 2020-08-04: 10 mL via INTRAVENOUS

## 2020-08-06 ENCOUNTER — Inpatient Hospital Stay (HOSPITAL_COMMUNITY): Payer: 59 | Attending: Hematology | Admitting: Hematology

## 2020-08-06 DIAGNOSIS — C61 Malignant neoplasm of prostate: Secondary | ICD-10-CM

## 2020-08-06 DIAGNOSIS — C772 Secondary and unspecified malignant neoplasm of intra-abdominal lymph nodes: Secondary | ICD-10-CM

## 2020-08-06 NOTE — Progress Notes (Signed)
Virtual Visit via Telephone Note  I connected with Randall Dawson on 08/06/20 at  3:45 PM EST by telephone and verified that I am speaking with the correct person using two identifiers.  Location: Patient: At home Provider: In the office   I discussed the limitations, risks, security and privacy concerns of performing an evaluation and management service by telephone and the availability of in person appointments. I also discussed with the patient that there may be a patient responsible charge related to this service. The patient expressed understanding and agreed to proceed.   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  He started on abiraterone and prednisone around 02/01/2020.  He is also on Lupron injections every 6 months.   Observations/Objective: He complained of bilateral hip pains while weightbearing.  It hurts more on walking.  Pain is in both hips.  He also complains of some pain in the lower back region.  Assessment and Plan:  1.  Metastatic castration sensitive prostate cancer: - His latest PSA 0.24 and trending down nicely. - His metastatic disease is in the lymph nodes. - He will continue Abiraterone 1000 mg daily and prednisone 5 mg twice daily.  Continue Lupron every 6 months. - RTC 2 months for follow-up with labs and PSA.  2.  Bilateral hip pains: - He complained of bilateral hip pains while walking and standing. - No injuries reported. - I have reviewed MRI of bilateral hips with and without contrast.  It was negative for metastatic disease.  Trochanteric bursitis on the left side with complete tear of the left gluteus minimus with tendon retraction.  Tendinosis of the left gluteus medius without tear.  Degenerative changes at L5 and S1. - We will make a referral to Dr. Aline Brochure for further management.   Follow Up Instructions: RTC 2 months with labs.   I  discussed the assessment and treatment plan with the patient. The patient was provided an opportunity to ask questions and all were answered. The patient agreed with the plan and demonstrated an understanding of the instructions.   The patient was advised to call back or seek an in-person evaluation if the symptoms worsen or if the condition fails to improve as anticipated.  I provided 11 minutes of non-face-to-face time during this encounter.   Derek Jack, MD

## 2020-08-24 ENCOUNTER — Encounter: Payer: Self-pay | Admitting: Orthopedic Surgery

## 2020-08-24 ENCOUNTER — Other Ambulatory Visit: Payer: Self-pay

## 2020-08-24 ENCOUNTER — Other Ambulatory Visit (HOSPITAL_COMMUNITY): Payer: Self-pay | Admitting: *Deleted

## 2020-08-24 ENCOUNTER — Ambulatory Visit (INDEPENDENT_AMBULATORY_CARE_PROVIDER_SITE_OTHER): Payer: 59 | Admitting: Orthopedic Surgery

## 2020-08-24 VITALS — BP 134/91 | HR 85 | Ht 69.0 in | Wt 236.1 lb

## 2020-08-24 DIAGNOSIS — M5136 Other intervertebral disc degeneration, lumbar region: Secondary | ICD-10-CM

## 2020-08-24 DIAGNOSIS — C61 Malignant neoplasm of prostate: Secondary | ICD-10-CM

## 2020-08-24 DIAGNOSIS — C772 Secondary and unspecified malignant neoplasm of intra-abdominal lymph nodes: Secondary | ICD-10-CM

## 2020-08-24 MED ORDER — ABIRATERONE ACETATE 250 MG PO TABS: 1000.0000 mg | ORAL_TABLET | Freq: Every day | ORAL | 11 refills | Status: DC

## 2020-08-24 NOTE — Progress Notes (Signed)
NEW PROBLEM//OFFICE VISIT  Summary assessment and plan:   60 year old male currently undergoing treatment for prostate cancer with oral agent and injection presents with bilateral hip pain lower back pain with normal MRIs of the hip and normal x-rays of the hip  Patient will need MRI of his lumbar spine to evaluate possible metastasis versus degenerative disc disease  Chief Complaint  Patient presents with  . Hip Pain    Bilateral/the right one is the worse today.Sharp dull pain when walking not so much when sitting.    60 year old male undergoing prostate cancer treatment presents with bilateral hip and lower back pain for about 6 months.  He is followed at the cancer center and appears to be doing reasonably well.  He complains of bilateral hip pain laterally as well as lower back pain and has a history of his back locking up in the past  He has had MRIs of his hip and x-rays which do not show any arthritis of the hip joint.  There is a note of a left hip gluteus muscle tear about 6 months ago which is not a major contributing factor to the pain he is complaining of.  He complains of pain in the lower back lateral sides of both hips without numbness or tingling in his lower legs does complain of some right hip weakness   Review of Systems  Constitutional: Positive for diaphoresis and malaise/fatigue.  Cardiovascular: Positive for leg swelling.  Musculoskeletal: Positive for back pain, joint pain, myalgias and neck pain.  Neurological: Positive for weakness.  Endo/Heme/Allergies: Bruises/bleeds easily.  Psychiatric/Behavioral: Positive for depression.  All other systems reviewed and are negative.    Past Medical History:  Diagnosis Date  . Family history of lung cancer   . Family history of lymphoma   . Family history of prostate cancer   . History of hepatitis B 01/14/2020  . 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 FOREIGN BODY REMOVAL N/A 07/13/2017   Procedure: FOREIGN BODY REMOVAL ADULT;  Surgeon: Helayne Seminole, MD;  Location: [facility];  Service: ENT;  Laterality: N/A;  . LEG SURGERY     rods in leg from fracture  . RIGID ESOPHAGOSCOPY N/A 07/13/2017   Procedure: RIGID ESOPHAGOSCOPY;  Surgeon: Helayne Seminole, MD;  Location: [facility] OR;  Service: ENT;  Laterality: N/A;    Family History  Problem Relation Age of Onset  . Dementia Mother   . Prostate cancer Father 70       metastatic  . Cancer Sister        cancer on leg, dx. in her 65s  . Lymphoma Brother 23  . Lung cancer Paternal Aunt        dx. >50   Social History   Tobacco Use  . Smoking status: Never Smoker  . Smokeless tobacco: Never Used  Vaping Use  . Vaping Use: Never used  Substance Use Topics  . Alcohol use: No  . Drug use: No    No Known Allergies  Current Meds  Medication Sig  . abiraterone acetate (ZYTIGA) 250 MG tablet Take 4 tablets (1,000 mg total) by mouth daily. Take on an empty stomach 1 hour before or 2 hours after a meal  . butalbital-acetaminophen-caffeine (FIORICET) 50-325-40 MG tablet Take 1-2 tablets by mouth every 6 (six) hours as needed for headache.  . ondansetron (ZOFRAN ODT) 4 MG disintegrating tablet Take 1 tablet (4  mg total) by mouth every 8 (eight) hours as needed for nausea or vomiting.  . predniSONE (DELTASONE) 5 MG tablet Take 1 tablet (5 mg total) by mouth 2 (two) times daily with a meal.  . prochlorperazine (COMPAZINE) 10 MG tablet Take 1 tablet (10 mg total) by mouth every 6 (six) hours as needed for nausea or vomiting.    BP (!) 134/91   Pulse 85   Ht 5\' 9"  (1.753 m)   Wt 236 lb 2 oz (107.1 kg)   BMI 34.87 kg/m   Physical Exam Constitutional:      General: He is not in acute distress.    Appearance: He is well-developed.     Comments: Well developed, well nourished Normal grooming and hygiene     Cardiovascular:     Comments: No peripheral  edema Musculoskeletal:     Comments: Right and left hips nontender  Right and left hip range of motion normal  Skin:    General: Skin is warm and dry.  Neurological:     Mental Status: He is alert and oriented to person, place, and time.     Sensory: No sensory deficit.     Coordination: Coordination normal.     Gait: Gait normal.     Deep Tendon Reflexes: Reflexes are normal and symmetric.  Psychiatric:        Mood and Affect: Mood normal.        Behavior: Behavior normal.        Thought Content: Thought content normal.        Judgment: Judgment normal.     Comments: Affect normal       MEDICAL DECISION MAKING  A.  Encounter Diagnosis  Name Primary?  . Lumbar degenerative disc disease Yes    B. DATA ANALYSED:   IMAGING: Interpretation of images: MRI of the hips show no degenerative arthritis pelvic x-ray shows no degenerative arthritis  These images are dated June 2021  Outside records reviewed: Cancer center notes indicate patient treatment for prostate cancer   C. MANAGEMENT   Recommend MRI to evaluate his back his x-ray shows degenerative disc disease his symptoms showed more back than hip pain especially with the normal studies of the hip  No orders of the defined types were placed in this encounter.     Arther Abbott, MD  08/24/2020 11:29 AM

## 2020-08-25 ENCOUNTER — Encounter (HOSPITAL_COMMUNITY): Payer: Self-pay | Admitting: *Deleted

## 2020-08-25 ENCOUNTER — Other Ambulatory Visit (HOSPITAL_COMMUNITY): Payer: Self-pay | Admitting: *Deleted

## 2020-08-25 DIAGNOSIS — C61 Malignant neoplasm of prostate: Secondary | ICD-10-CM

## 2020-08-25 DIAGNOSIS — C772 Secondary and unspecified malignant neoplasm of intra-abdominal lymph nodes: Secondary | ICD-10-CM

## 2020-08-25 MED ORDER — ABIRATERONE ACETATE 250 MG PO TABS: 1000.0000 mg | ORAL_TABLET | Freq: Every day | ORAL | 11 refills | Status: DC

## 2020-08-27 ENCOUNTER — Other Ambulatory Visit (HOSPITAL_COMMUNITY): Payer: Self-pay

## 2020-08-27 NOTE — Telephone Encounter (Signed)
Medication list faxed to [facility] at their request

## 2020-08-28 ENCOUNTER — Encounter (HOSPITAL_COMMUNITY): Payer: Self-pay

## 2020-08-28 ENCOUNTER — Other Ambulatory Visit (HOSPITAL_COMMUNITY): Payer: Self-pay

## 2020-08-28 NOTE — Progress Notes (Signed)
This nurse spoke with patients family member that stats that he is having a hard time getting his refill on Zytiga.  This nurse contacted pharmacy and was informed that patients med list needed to be faxed over. Then nurse called and verified receipt of the faxed med list.  Patient called and message left  And made aware that the pharmacy has sent an urgent e-mail to fill medication and someone would be calling him to set up delivery.

## 2020-08-28 NOTE — Telephone Encounter (Signed)
Chart accessed to print patient medication list.

## 2020-08-31 ENCOUNTER — Inpatient Hospital Stay (HOSPITAL_COMMUNITY): Payer: 59

## 2020-09-10 ENCOUNTER — Inpatient Hospital Stay (HOSPITAL_COMMUNITY): Payer: 59 | Attending: Hematology

## 2020-09-10 ENCOUNTER — Encounter (HOSPITAL_COMMUNITY): Payer: Self-pay

## 2020-09-10 ENCOUNTER — Other Ambulatory Visit: Payer: Self-pay

## 2020-09-10 VITALS — BP 119/77 | HR 86 | Temp 96.9°F | Resp 18

## 2020-09-10 DIAGNOSIS — C61 Malignant neoplasm of prostate: Secondary | ICD-10-CM | POA: Insufficient documentation

## 2020-09-10 DIAGNOSIS — C772 Secondary and unspecified malignant neoplasm of intra-abdominal lymph nodes: Secondary | ICD-10-CM

## 2020-09-10 DIAGNOSIS — Z5111 Encounter for antineoplastic chemotherapy: Secondary | ICD-10-CM | POA: Diagnosis not present

## 2020-09-10 MED ORDER — LEUPROLIDE ACETATE (6 MONTH) 45 MG ~~LOC~~ KIT
45.0000 mg | PACK | Freq: Once | SUBCUTANEOUS | Status: AC
  Administered 2020-09-10: 45 mg via SUBCUTANEOUS
  Filled 2020-09-10: qty 45

## 2020-09-10 NOTE — Progress Notes (Signed)
Randall Dawson presents today for injection per the provider's orders.  Eligard administration without incident; injection site WNL; see MAR for injection details.  Patient tolerated procedure well and without incident.  No questions or complaints noted at this time. Discharged ambulatory in stable condition.

## 2020-09-21 ENCOUNTER — Ambulatory Visit (HOSPITAL_COMMUNITY)
Admission: RE | Admit: 2020-09-21 | Discharge: 2020-09-21 | Disposition: A | Discharge status: Home / Self Care | Payer: 59 | Source: Ambulatory Visit | Attending: Orthopedic Surgery | Admitting: Orthopedic Surgery

## 2020-09-21 ENCOUNTER — Other Ambulatory Visit: Payer: Self-pay

## 2020-09-21 DIAGNOSIS — M5136 Other intervertebral disc degeneration, lumbar region: Secondary | ICD-10-CM | POA: Diagnosis present

## 2020-10-08 ENCOUNTER — Other Ambulatory Visit: Payer: Self-pay

## 2020-10-08 ENCOUNTER — Inpatient Hospital Stay (HOSPITAL_COMMUNITY): Payer: 59 | Attending: Hematology

## 2020-10-08 DIAGNOSIS — Z801 Family history of malignant neoplasm of trachea, bronchus and lung: Secondary | ICD-10-CM | POA: Insufficient documentation

## 2020-10-08 DIAGNOSIS — Z7952 Long term (current) use of systemic steroids: Secondary | ICD-10-CM | POA: Insufficient documentation

## 2020-10-08 DIAGNOSIS — C775 Secondary and unspecified malignant neoplasm of intrapelvic lymph nodes: Secondary | ICD-10-CM | POA: Diagnosis not present

## 2020-10-08 DIAGNOSIS — Z806 Family history of leukemia: Secondary | ICD-10-CM | POA: Insufficient documentation

## 2020-10-08 DIAGNOSIS — M4317 Spondylolisthesis, lumbosacral region: Secondary | ICD-10-CM | POA: Insufficient documentation

## 2020-10-08 DIAGNOSIS — C772 Secondary and unspecified malignant neoplasm of intra-abdominal lymph nodes: Secondary | ICD-10-CM

## 2020-10-08 DIAGNOSIS — Z8042 Family history of malignant neoplasm of prostate: Secondary | ICD-10-CM | POA: Diagnosis not present

## 2020-10-08 DIAGNOSIS — Z8379 Family history of other diseases of the digestive system: Secondary | ICD-10-CM | POA: Diagnosis not present

## 2020-10-08 DIAGNOSIS — C61 Malignant neoplasm of prostate: Secondary | ICD-10-CM | POA: Diagnosis not present

## 2020-10-08 DIAGNOSIS — M25552 Pain in left hip: Secondary | ICD-10-CM | POA: Diagnosis not present

## 2020-10-08 DIAGNOSIS — K5900 Constipation, unspecified: Secondary | ICD-10-CM | POA: Insufficient documentation

## 2020-10-08 DIAGNOSIS — M5136 Other intervertebral disc degeneration, lumbar region: Secondary | ICD-10-CM | POA: Insufficient documentation

## 2020-10-08 DIAGNOSIS — M25551 Pain in right hip: Secondary | ICD-10-CM | POA: Diagnosis not present

## 2020-10-08 LAB — CBC WITH DIFFERENTIAL/PLATELET
Abs Immature Granulocytes: 0.06 10*3/uL (ref 0.00–0.07)
Basophils Absolute: 0 10*3/uL (ref 0.0–0.1)
Basophils Relative: 1 %
Eosinophils Absolute: 0.1 10*3/uL (ref 0.0–0.5)
Eosinophils Relative: 1 %
HCT: 39.6 % (ref 39.0–52.0)
Hemoglobin: 12.8 g/dL — ABNORMAL LOW (ref 13.0–17.0)
Immature Granulocytes: 1 %
Lymphocytes Relative: 14 %
Lymphs Abs: 1.2 10*3/uL (ref 0.7–4.0)
MCH: 32.3 pg (ref 26.0–34.0)
MCHC: 32.3 g/dL (ref 30.0–36.0)
MCV: 100 fL (ref 80.0–100.0)
Monocytes Absolute: 0.8 10*3/uL (ref 0.1–1.0)
Monocytes Relative: 9 %
Neutro Abs: 6.7 10*3/uL (ref 1.7–7.7)
Neutrophils Relative %: 74 %
Platelets: 283 10*3/uL (ref 150–400)
RBC: 3.96 MIL/uL — ABNORMAL LOW (ref 4.22–5.81)
RDW: 13.2 % (ref 11.5–15.5)
WBC: 8.9 10*3/uL (ref 4.0–10.5)
nRBC: 0 % (ref 0.0–0.2)

## 2020-10-08 LAB — COMPREHENSIVE METABOLIC PANEL
ALT: 11 U/L (ref 0–44)
AST: 16 U/L (ref 15–41)
Albumin: 3.8 g/dL (ref 3.5–5.0)
Alkaline Phosphatase: 83 U/L (ref 38–126)
Anion gap: 7 (ref 5–15)
BUN: 20 mg/dL (ref 6–20)
CO2: 24 mmol/L (ref 22–32)
Calcium: 9.3 mg/dL (ref 8.9–10.3)
Chloride: 108 mmol/L (ref 98–111)
Creatinine, Ser: 0.8 mg/dL (ref 0.61–1.24)
GFR, Estimated: >60 mL/min (ref >60)
Glucose, Bld: 105 mg/dL — ABNORMAL HIGH (ref 70–99)
Potassium: 3.9 mmol/L (ref 3.5–5.1)
Sodium: 139 mmol/L (ref 135–145)
Total Bilirubin: 0.4 mg/dL (ref 0.3–1.2)
Total Protein: 7.3 g/dL (ref 6.5–8.1)

## 2020-10-08 LAB — PSA: Prostatic Specific Antigen: 0.16 ng/mL (ref 0.00–4.00)

## 2020-10-15 ENCOUNTER — Other Ambulatory Visit: Payer: Self-pay

## 2020-10-15 ENCOUNTER — Inpatient Hospital Stay (HOSPITAL_BASED_OUTPATIENT_CLINIC_OR_DEPARTMENT_OTHER): Payer: 59 | Admitting: Hematology

## 2020-10-15 VITALS — BP 123/82 | HR 85 | Temp 97.0°F | Resp 18 | Wt 236.0 lb

## 2020-10-15 DIAGNOSIS — C61 Malignant neoplasm of prostate: Secondary | ICD-10-CM | POA: Diagnosis not present

## 2020-10-15 DIAGNOSIS — C772 Secondary and unspecified malignant neoplasm of intra-abdominal lymph nodes: Secondary | ICD-10-CM

## 2020-10-15 NOTE — Progress Notes (Signed)
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 The report date is 03/15/2020.   The Common Hereditary Cancers Panel offered by [facility] includes sequencing and/or deletion duplication testing of the following 47 genes: APC, ATM, AXIN2, BARD1, BMPR1A, BRCA1, BRCA2, BRIP1, CDH1, CDK4, CDKN2A (p14ARF), CDKN2A (p16INK4a), CHEK2, CTNNA1, DICER1, EPCAM (Deletion/duplication testing only), GREM1 (promoter region deletion/duplication testing only), KIT, MEN1, MLH1, MSH2, MSH3, MSH6, MUTYH, NBN, NF1, NTHL1, PALB2, PDGFRA, PMS2, POLD1, POLE, PTEN, RAD50, RAD51C, RAD51D, SDHB, SDHC, SDHD, SMAD4, SMARCA4. STK11, TP53, TSC1, TSC2, and VHL.  The following genes were evaluated for sequence changes only: SDHA and HOXB13 c.251G>A variant only. The Prostate Cancer HRR Panel offered by [facility] includes sequencing and/or deletion/duplication analysis of the following 10 genes: ATM, BARD1, BRCA1, BRCA2, BRIP1, CHEK2, FANCL, PALB2, RAD51C, RAD51D.     CANCER STAGING: Cancer Staging No matching staging information was found for the patient.  INTERVAL HISTORY:  [name], a 60 y.o. male, returns for routine follow-up of his metastatic prostate cancer. [name] was last contacted via telephone on 08/06/2020.   Today he reports feeling okay. He is taking Zytiga 1,000  mg on an empty stomach and he is tolerating it well. He is also having more bruising but denies hematochezia or hematuria. He complains of having constipation and hips pain, more on the right side than on the left, and is having it followed by Dr. Aline Brochure. He tried stool softener but it did not help the constipation. He denies having any CP. He is getting his Lupron injection every 6 months.   REVIEW OF SYSTEMS:  Review of Systems  Constitutional: Positive for fatigue (50%). Negative for appetite change.  Cardiovascular: Negative for chest pain.  Gastrointestinal: Positive for constipation. Negative for blood in stool.  Genitourinary: Negative for hematuria.   Musculoskeletal: Positive for arthralgias (R hip pain > L hip pain).  Hematological: Bruises/bleeds easily (easy bruising).  All other systems reviewed and are negative.   PAST MEDICAL/SURGICAL HISTORY:  Past Medical History:  Diagnosis Date  . Family history of lung cancer   . Family history of lymphoma   . Family history of prostate cancer   . History of hepatitis B 01/14/2020  . 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 FOREIGN BODY REMOVAL N/A 07/13/2017   Procedure: FOREIGN BODY REMOVAL ADULT;  Surgeon: Helayne Seminole, MD;  Location: [facility];  Service: ENT;  Laterality: N/A;  . LEG SURGERY     rods in leg from fracture  . RIGID ESOPHAGOSCOPY N/A 07/13/2017   Procedure: RIGID ESOPHAGOSCOPY;  Surgeon: Helayne Seminole, MD;  Location: [facility] OR;  Service: ENT;  Laterality: N/A;    SOCIAL HISTORY:  Social History   Socioeconomic History  . Marital status: Divorced    Spouse name: Not on file  .  Number of children: 0  . Years of education: Not on file  . Highest education level: Not on file  Occupational History  . Occupation: EMPLOYED  Tobacco Use  . Smoking status: Never Smoker  . Smokeless tobacco: Never Used  Vaping Use  . Vaping  Use: Never used  Substance and Sexual Activity  . Alcohol use: No  . Drug use: No  . Sexual activity: Not on file  Other Topics Concern  . Not on file  Social History Narrative  . Not on file   Social Determinants of Health   Financial Resource Strain: Medium Risk  . Difficulty of Paying Living Expenses: Somewhat hard  Food Insecurity: Food Insecurity Present  . Worried About Charity fundraiser in the Last Year: Sometimes true  . Ran Out of Food in the Last Year: Sometimes true  Transportation Needs: No Transportation Needs  . Lack of Transportation (Medical): No  . Lack of Transportation (Non-Medical): No  Physical Activity: Inactive  . Days of Exercise per Week: 0 days  . Minutes of Exercise per Session: 0 min  Stress: Stress Concern Present  . Feeling of Stress : To some extent  Social Connections: Moderately Isolated  . Frequency of Communication with Friends and Family: Twice a week  . Frequency of Social Gatherings with Friends and Family: Twice a week  . Attends Religious Services: More than 4 times per year  . Active Member of Clubs or Organizations: No  . Attends Archivist Meetings: Never  . Marital Status: Divorced  Human resources officer Violence: Not At Risk  . Fear of Current or Ex-Partner: No  . Emotionally Abused: No  . Physically Abused: No  . Sexually Abused: No    FAMILY HISTORY:  Family History  Problem Relation Age of Onset  . Dementia Mother   . Prostate cancer Father 60       metastatic  . Cancer Sister        cancer on leg, dx. in her 34s  . Lymphoma Brother 33  . Lung cancer Paternal Aunt        dx. >50    CURRENT MEDICATIONS:  Current Outpatient Medications  Medication Sig Dispense Refill  . abiraterone acetate (ZYTIGA) 250 MG tablet Take 4 tablets (1,000 mg total) by mouth daily. Take on an empty stomach 1 hour before or 2 hours after a meal 120 tablet 11  . predniSONE (DELTASONE) 5 MG tablet Take 1 tablet (5 mg total) by mouth 2  (two) times daily with a meal. 60 tablet 11  . butalbital-acetaminophen-caffeine (FIORICET) 50-325-40 MG tablet Take 1-2 tablets by mouth every 6 (six) hours as needed for headache. (Patient not taking: Reported on 10/15/2020) 20 tablet 0  . ondansetron (ZOFRAN ODT) 4 MG disintegrating tablet Take 1 tablet (4 mg total) by mouth every 8 (eight) hours as needed for nausea or vomiting. (Patient not taking: Reported on 10/15/2020) 20 tablet 0  . prochlorperazine (COMPAZINE) 10 MG tablet Take 1 tablet (10 mg total) by mouth every 6 (six) hours as needed for nausea or vomiting. (Patient not taking: Reported on 10/15/2020) 60 tablet 2   No current facility-administered medications for this visit.    ALLERGIES:  No Known Allergies  PHYSICAL EXAM:  Performance status (ECOG): 0 - Asymptomatic  Vitals:   10/15/20 1529  BP: 123/82  Pulse: 85  Resp: 18  Temp: (!) 97 F (36.1 C)  SpO2: 96%   Wt Readings from Last 3 Encounters:  10/15/20  236 lb (107 kg)  08/24/20 236 lb 2 oz (107.1 kg)  07/21/20 241 lb 1.6 oz (109.4 kg)   Physical Exam Vitals reviewed.  Constitutional:      Appearance: Normal appearance. He is obese.  Neurological:     General: No focal deficit present.     Mental Status: He is alert and oriented to person, place, and time.  Psychiatric:        Mood and Affect: Mood normal.        Behavior: Behavior normal.      LABORATORY DATA:  I have reviewed the labs as listed.  CBC Latest Ref Rng & Units 10/08/2020 07/21/2020 05/20/2020  WBC 4.0 - 10.5 K/uL 8.9 8.3 9.8  Hemoglobin 13.0 - 17.0 g/dL 12.8(L) 13.3 13.4  Hematocrit 39.0 - 52.0 % 39.6 42.0 42.1  Platelets 150 - 400 K/uL 283 278 281   CMP Latest Ref Rng & Units 10/08/2020 07/21/2020 05/20/2020  Glucose 70 - 99 mg/dL 105(H) 112(H) 90  BUN 6 - 20 mg/dL 20 18 17   Creatinine 0.61 - 1.24 mg/dL 0.80 0.82 0.80  Sodium 135 - 145 mmol/L 139 139 137  Potassium 3.5 - 5.1 mmol/L 3.9 3.5 4.0  Chloride 98 - 111 mmol/L 108 106 102   CO2 22 - 32 mmol/L 24 26 28   Calcium 8.9 - 10.3 mg/dL 9.3 9.0 9.3  Total Protein 6.5 - 8.1 g/dL 7.3 6.9 7.4  Total Bilirubin 0.3 - 1.2 mg/dL 0.4 0.6 1.0  Alkaline Phos 38 - 126 U/L 83 77 71  AST 15 - 41 U/L 16 21 18   ALT 0 - 44 U/L 11 13 14    PSA 0.16 10/08/2020  PSA 0.24 07/21/2020  PSA 0.41 05/20/2020    DIAGNOSTIC IMAGING:  I have independently reviewed the scans and discussed with the patient. MR Lumbar Spine Wo Contrast  Result Date: 09/22/2020 CLINICAL DATA:  Lumbar degenerative disc disease. Intervertebral disc degeneration. Additional history provided by scanning technologist: Patient reports low back pain for 6 months. EXAM: MRI LUMBAR SPINE WITHOUT CONTRAST TECHNIQUE: Multiplanar, multisequence MR imaging of the lumbar spine was performed. No intravenous contrast was administered. COMPARISON:  Radiographs of the lumbar spine 01/14/2020. FINDINGS: Segmentation: The lowest well-formed intervertebral disc space is designated L5-S1. Alignment: Lumbar dextrocurvature. Mild grade 1 retrolisthesis at L1-L2, L2-L3, L3-L4, L4-L5 and L5-S1. Vertebrae: Vertebral body height is maintained. Mild scattered degenerative edema within the posterior elements. Trace degenerative edema along the L4 superior endplate anteriorly. Small L3 vertebral body hemangioma. Multilevel ventrolateral osteophytes. Conus medullaris and cauda equina: Conus extends to the T12-L1 level. No signal abnormality within the visualized distal spinal cord. Paraspinal and other soft tissues: No abnormality identified within included portions of the abdomen/retroperitoneum. Paraspinal soft tissues within normal limits. Disc levels: Multilevel disc degeneration. Most notably, there is moderate disc degeneration at L4-L5 and advanced disc degeneration at L5-S1. T12-L1: Imaged sagittally. No significant disc herniation or stenosis. L1-L2: Grade 1 retrolisthesis. Disc bulge. Superimposed broad-based right subarticular/foraminal disc  protrusion. Mild facet arthrosis. Mild right greater than left subarticular narrowing without nerve root impingement. Central canal patent. No significant foraminal stenosis. L2-L3: Grade 1 retrolisthesis. Disc bulge. Superimposed broad-based right subarticular/foraminal disc protrusion. Mild facet arthrosis/ligamentum flavum hypertrophy. Trace right facet joint effusion. Minimal right subarticular narrowing without appreciable nerve root impingement. Central canal patent. Borderline mild right inferior neural foraminal narrowing. L3-L4: Grade 1 retrolisthesis. Disc bulge. Superimposed broad-based right foraminal disc protrusion. Mild facet arthrosis/ligamentum flavum hypertrophy. Small right facet joint effusion. Mild bilateral  subarticular and central canal narrowing without nerve root impingement. Mild bilateral neural foraminal narrowing (greater on the right). L4-L5: Grade 1 retrolisthesis. Disc bulge with endplate spurring. Superimposed broad-based right center to right foraminal disc protrusion with associated osteophyte ridge. Moderate facet arthrosis and ligamentum flavum hypertrophy. The disc protrusion contributes to severe right subarticular stenosis, encroaching upon multiple descending right-sided nerve roots, most notably the descending right L5 nerve root. Mild left subarticular narrowing. Mild/moderate central canal narrowing. Bilateral neural foraminal narrowing (mild/moderate right, mild left). L5-S1: Grade 1 retrolisthesis. Disc bulge with endplate spurring. Superimposed shallow left center/subarticular disc protrusion with associated osteophyte ridge. Mild facet arthrosis/ligamentum flavum hypertrophy. The disc protrusion and associated osteophyte ridge contribute to mild left subarticular narrowing, contacting the descending left S1 nerve root (series 8, image 26). Central canal patent. Mild/moderate right neural foraminal narrowing. Also of note, epidural lipomatosis mildly narrows the thecal  sac at the L5 vertebral body level. IMPRESSION: Lumbar spondylosis as outlined and with findings most notably as follows. At L4-L5, there is moderate disc degeneration. A broad-based right center to right foraminal disc protrusion with associated osteophyte ridge contributes to severe right subarticular stenosis, encroaching upon multiple descending right-sided nerve roots (most notably the descending right L5 nerve root). Multifactorial mild left subarticular and mild/moderate central canal narrowing. Bilateral neural foraminal narrowing (mild/moderate right, mild left). At L5-S1, there is advanced disc degeneration. A small left center disc protrusion with associated osteophyte ridge contributes to mild left subarticular narrowing and contacts the descending left S1 nerve root. Multifactorial mild/moderate right neural foraminal narrowing. No more than mild spinal canal or neural foraminal narrowing at the remaining levels. Electronically Signed   By: Kellie Simmering DO   On: 09/22/2020 10:32     ASSESSMENT:  1. 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 Serum testosterone on 12/17/2019 of 197. -Bone scan on 12/18/2019 at [facility] shows small focus of uptake seen left inferior orbit favoring benign etiology. No suspicious foci of bone meta stasis. -CTAP with contrast on 12/18/2019 showed retroperitoneal and bilateral iliac chain metastatic adenopathy. Left para-aortic lymph node measures 15 mm. Right external iliac lymph node measures 22 x 35 mm. Left external iliac lymph node measures 26 x 35 mm. Ill-defined hypoenhancing 12 mm hepatic lesion, indeterminate, however concerning for metastasis. Sclerotic focus in the right posterior ilium, indeterminate. -MRI of the abdomen on 01/10/2020 shows tiny 8 mm low-attenuation lesion in the posterior right hepatic lobe, too small to  characterize. No other significant abnormalities. -Abiraterone and prednisone started around 02/01/2020. Degarelix started on 01/29/2020. -Lupron 45 mg on 02/26/2020.  2. Family history: -Father died of prostate cancer at age 78. Sister had cancer on her leg resected. Paternal uncle had cancer, patient does not know the type.   PLAN:  1. Metastatic castration sensitive prostate cancer: -Continue Abiraterone 1000 mg daily.  Continue prednisone 5 mg twice daily. -PSA improved to 0.16 from 0.24 at last visit. -Continue Lupron every 6 months. -RTC 2 months for follow-up.  2. Family history: -Negative for germline mutation testing.  3.  Bilateral hip pains: -We have done MRI of the hip which showed some degenerative changes.  He was evaluated by Dr. Aline Brochure and MRI of the lumbar spine was ordered.  No evidence of metastatic disease.  4. Chest pain: -He was evaluated by cardiology and stress test was recommended.  Insurance denied it. -He no longer has chest pains.  5.  Constipation: -He is taking stool softener but reports worsening constipation since Abiraterone started. -I have recommended using MiraLAX or milk of magnesia as needed along with stool softener.   Orders placed this encounter:  Orders Placed This Encounter  Procedures  . CBC with Differential/Platelet  . Comprehensive metabolic panel  . PSA     Derek Jack, MD [facility] [phone]   I, Milinda Antis, am acting as a scribe for Dr. Sanda Linger.  I, Derek Jack MD, have reviewed the above documentation for accuracy and completeness, and I agree with the above.

## 2020-10-15 NOTE — Patient Instructions (Signed)
[place] at [facility] Discharge Instructions  You were seen today by Dr. Delton Coombes. He went over your recent results and scans. Follow up with Dr. Aline Brochure for your hip pain. Continue taking a stool softener daily and take Miralax once a day to treat the constipation. Dr. Delton Coombes will see you back in 2 months for labs and follow up.   Thank you for choosing [place] at [facility] to provide your oncology and hematology care.  To afford each patient quality time with our provider, please arrive at least 15 minutes before your scheduled appointment time.   If you have a lab appointment with the [facility] please come in thru the Main Entrance and check in at the main information desk  You need to re-schedule your appointment should you arrive 10 or more minutes late.  We strive to give you quality time with our providers, and arriving late affects you and other patients whose appointments are after yours.  Also, if you no show three or more times for appointments you may be dismissed from the clinic at the providers discretion.     Again, thank you for choosing [facility].  Our hope is that these requests will decrease the amount of time that you wait before being seen by our physicians.       _____________________________________________________________  Should you have questions after your visit to [facility], please contact our office at [phone] between the hours of 8:00 a.m. and 4:30 p.m.  Voicemails left after 4:00 p.m. will not be returned until the following business day.  For prescription refill requests, have your pharmacy contact our office and allow 72 hours.    Cancer Center Support Programs:   > Cancer Support Group  2nd Tuesday of the month 1pm-2pm, Journey Room

## 2020-12-17 ENCOUNTER — Inpatient Hospital Stay (HOSPITAL_COMMUNITY): Payer: 59 | Attending: Hematology

## 2020-12-17 ENCOUNTER — Other Ambulatory Visit: Payer: Self-pay

## 2020-12-17 DIAGNOSIS — C772 Secondary and unspecified malignant neoplasm of intra-abdominal lymph nodes: Secondary | ICD-10-CM | POA: Diagnosis not present

## 2020-12-17 DIAGNOSIS — C61 Malignant neoplasm of prostate: Secondary | ICD-10-CM | POA: Diagnosis present

## 2020-12-17 LAB — COMPREHENSIVE METABOLIC PANEL
ALT: 8 U/L (ref 0–44)
AST: 15 U/L (ref 15–41)
Albumin: 3.7 g/dL (ref 3.5–5.0)
Alkaline Phosphatase: 81 U/L (ref 38–126)
Anion gap: 4 — ABNORMAL LOW (ref 5–15)
BUN: 22 mg/dL — ABNORMAL HIGH (ref 6–20)
CO2: 30 mmol/L (ref 22–32)
Calcium: 9.1 mg/dL (ref 8.9–10.3)
Chloride: 104 mmol/L (ref 98–111)
Creatinine, Ser: 0.81 mg/dL (ref 0.61–1.24)
GFR, Estimated: >60 mL/min (ref >60)
Glucose, Bld: 102 mg/dL — ABNORMAL HIGH (ref 70–99)
Potassium: 4.1 mmol/L (ref 3.5–5.1)
Sodium: 138 mmol/L (ref 135–145)
Total Bilirubin: 0.7 mg/dL (ref 0.3–1.2)
Total Protein: 6.8 g/dL (ref 6.5–8.1)

## 2020-12-17 LAB — CBC WITH DIFFERENTIAL/PLATELET
Abs Immature Granulocytes: 0.04 10*3/uL (ref 0.00–0.07)
Basophils Absolute: 0.1 10*3/uL (ref 0.0–0.1)
Basophils Relative: 1 %
Eosinophils Absolute: 0.1 10*3/uL (ref 0.0–0.5)
Eosinophils Relative: 2 %
HCT: 39.2 % (ref 39.0–52.0)
Hemoglobin: 12.6 g/dL — ABNORMAL LOW (ref 13.0–17.0)
Immature Granulocytes: 1 %
Lymphocytes Relative: 30 %
Lymphs Abs: 1.9 10*3/uL (ref 0.7–4.0)
MCH: 32.3 pg (ref 26.0–34.0)
MCHC: 32.1 g/dL (ref 30.0–36.0)
MCV: 100.5 fL — ABNORMAL HIGH (ref 80.0–100.0)
Monocytes Absolute: 0.8 10*3/uL (ref 0.1–1.0)
Monocytes Relative: 13 %
Neutro Abs: 3.4 10*3/uL (ref 1.7–7.7)
Neutrophils Relative %: 53 %
Platelets: 253 10*3/uL (ref 150–400)
RBC: 3.9 MIL/uL — ABNORMAL LOW (ref 4.22–5.81)
RDW: 13.2 % (ref 11.5–15.5)
WBC: 6.3 10*3/uL (ref 4.0–10.5)
nRBC: 0 % (ref 0.0–0.2)

## 2020-12-17 LAB — PSA: Prostatic Specific Antigen: 0.06 ng/mL (ref 0.00–4.00)

## 2020-12-24 ENCOUNTER — Other Ambulatory Visit (HOSPITAL_COMMUNITY): Payer: Self-pay | Admitting: *Deleted

## 2020-12-24 ENCOUNTER — Other Ambulatory Visit: Payer: Self-pay

## 2020-12-24 ENCOUNTER — Encounter (HOSPITAL_COMMUNITY): Payer: Self-pay | Admitting: Hematology

## 2020-12-24 ENCOUNTER — Telehealth (HOSPITAL_COMMUNITY): Payer: 59 | Admitting: Hematology

## 2020-12-24 ENCOUNTER — Inpatient Hospital Stay (HOSPITAL_COMMUNITY): Payer: 59 | Admitting: Hematology

## 2020-12-24 DIAGNOSIS — C61 Malignant neoplasm of prostate: Secondary | ICD-10-CM

## 2020-12-24 DIAGNOSIS — C772 Secondary and unspecified malignant neoplasm of intra-abdominal lymph nodes: Secondary | ICD-10-CM

## 2020-12-24 MED ORDER — PREDNISONE 5 MG PO TABS: 5.0000 mg | ORAL_TABLET | Freq: Two times a day (BID) | ORAL | 11 refills | Status: DC

## 2021-01-06 ENCOUNTER — Other Ambulatory Visit: Payer: Self-pay

## 2021-01-06 ENCOUNTER — Inpatient Hospital Stay (HOSPITAL_COMMUNITY): Payer: 59 | Attending: Hematology | Admitting: Hematology

## 2021-01-06 DIAGNOSIS — C61 Malignant neoplasm of prostate: Secondary | ICD-10-CM | POA: Diagnosis not present

## 2021-01-06 DIAGNOSIS — C772 Secondary and unspecified malignant neoplasm of intra-abdominal lymph nodes: Secondary | ICD-10-CM | POA: Diagnosis not present

## 2021-01-06 NOTE — Progress Notes (Signed)
Virtual Visit via Telephone Note  I connected with Randall Dawson on 01/06/21 at  4:30 PM EDT by telephone and verified that I am speaking with the correct person using two identifiers.  Location: Patient: At home Provider: In office   I discussed the limitations, risks, security and privacy concerns of performing an evaluation and management service by telephone and the availability of in person appointments. I also discussed with the patient that there may be a patient responsible charge related to this service. The patient expressed understanding and agreed to proceed.   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  He was started on abiraterone and prednisone around 02/01/2020.  Last Lupron injection was on 09/10/2020.   Observations/Objective: He reports that he has been taking Abiraterone on empty stomach.  He reports occasional dizziness.  He is apparently checking blood pressure at home but does not remember how much it is.  He also reports easy bruising on the upper extremities when he bumps into things.  Denies any other GI side effects including nausea vomiting diarrhea.  Denies any new onset pains.  No infections reported.  Assessment and Plan:  1.  Metastatic CSPC: - Reviewed labs from 12/17/2020 which showed PSA improved to 0.06 from 0.16.  LFTs are normal. - Continue Abiraterone 1000 mg daily.  Continue prednisone 5 mg twice daily. - Next Lupron injection will be in August 2022. - RTC in person in 2 months with labs.  2.  Family history: - He was tested negative for germline mutation testing.  3.  Dizziness: - He was told to have a log of his blood pressure at home and call us back.   Follow Up Instructions: RTC 2 months for follow-up with labs.   I discussed the assessment and treatment plan with the patient. The patient was provided an opportunity to ask questions  and all were answered. The patient agreed with the plan and demonstrated an understanding of the instructions.   The patient was advised to call back or seek an in-person evaluation if the symptoms worsen or if the condition fails to improve as anticipated.  I provided 15 minutes of non-face-to-face time during this encounter.   Derek Jack, MD

## 2021-01-27 ENCOUNTER — Other Ambulatory Visit (HOSPITAL_COMMUNITY): Payer: Self-pay | Admitting: *Deleted

## 2021-01-27 DIAGNOSIS — C772 Secondary and unspecified malignant neoplasm of intra-abdominal lymph nodes: Secondary | ICD-10-CM

## 2021-01-27 MED ORDER — ABIRATERONE ACETATE 250 MG PO TABS: 1000.0000 mg | ORAL_TABLET | Freq: Every day | ORAL | 11 refills | Status: DC

## 2021-03-08 ENCOUNTER — Inpatient Hospital Stay (HOSPITAL_COMMUNITY): Payer: 59

## 2021-03-09 ENCOUNTER — Inpatient Hospital Stay (HOSPITAL_COMMUNITY): Payer: 59 | Attending: Hematology

## 2021-03-09 ENCOUNTER — Other Ambulatory Visit: Payer: Self-pay

## 2021-03-09 DIAGNOSIS — Z8042 Family history of malignant neoplasm of prostate: Secondary | ICD-10-CM | POA: Diagnosis not present

## 2021-03-09 DIAGNOSIS — M25473 Effusion, unspecified ankle: Secondary | ICD-10-CM | POA: Insufficient documentation

## 2021-03-09 DIAGNOSIS — K5900 Constipation, unspecified: Secondary | ICD-10-CM | POA: Insufficient documentation

## 2021-03-09 DIAGNOSIS — M7989 Other specified soft tissue disorders: Secondary | ICD-10-CM | POA: Diagnosis not present

## 2021-03-09 DIAGNOSIS — Z801 Family history of malignant neoplasm of trachea, bronchus and lung: Secondary | ICD-10-CM | POA: Insufficient documentation

## 2021-03-09 DIAGNOSIS — C772 Secondary and unspecified malignant neoplasm of intra-abdominal lymph nodes: Secondary | ICD-10-CM | POA: Diagnosis not present

## 2021-03-09 DIAGNOSIS — R42 Dizziness and giddiness: Secondary | ICD-10-CM | POA: Diagnosis not present

## 2021-03-09 DIAGNOSIS — Z79899 Other long term (current) drug therapy: Secondary | ICD-10-CM | POA: Insufficient documentation

## 2021-03-09 DIAGNOSIS — C61 Malignant neoplasm of prostate: Secondary | ICD-10-CM | POA: Insufficient documentation

## 2021-03-09 DIAGNOSIS — R5383 Other fatigue: Secondary | ICD-10-CM | POA: Insufficient documentation

## 2021-03-09 DIAGNOSIS — Z806 Family history of leukemia: Secondary | ICD-10-CM | POA: Diagnosis not present

## 2021-03-09 DIAGNOSIS — R232 Flushing: Secondary | ICD-10-CM | POA: Insufficient documentation

## 2021-03-09 LAB — CBC WITH DIFFERENTIAL/PLATELET
Abs Immature Granulocytes: 0.04 10*3/uL (ref 0.00–0.07)
Basophils Absolute: 0 10*3/uL (ref 0.0–0.1)
Basophils Relative: 0 %
Eosinophils Absolute: 0.1 10*3/uL (ref 0.0–0.5)
Eosinophils Relative: 1 %
HCT: 39.7 % (ref 39.0–52.0)
Hemoglobin: 12.8 g/dL — ABNORMAL LOW (ref 13.0–17.0)
Immature Granulocytes: 1 %
Lymphocytes Relative: 24 %
Lymphs Abs: 1.8 10*3/uL (ref 0.7–4.0)
MCH: 32.1 pg (ref 26.0–34.0)
MCHC: 32.2 g/dL (ref 30.0–36.0)
MCV: 99.5 fL (ref 80.0–100.0)
Monocytes Absolute: 0.8 10*3/uL (ref 0.1–1.0)
Monocytes Relative: 10 %
Neutro Abs: 5.1 10*3/uL (ref 1.7–7.7)
Neutrophils Relative %: 64 %
Platelets: 268 10*3/uL (ref 150–400)
RBC: 3.99 MIL/uL — ABNORMAL LOW (ref 4.22–5.81)
RDW: 13.2 % (ref 11.5–15.5)
WBC: 7.8 10*3/uL (ref 4.0–10.5)
nRBC: 0 % (ref 0.0–0.2)

## 2021-03-09 LAB — COMPREHENSIVE METABOLIC PANEL
ALT: 9 U/L (ref 0–44)
AST: 17 U/L (ref 15–41)
Albumin: 3.9 g/dL (ref 3.5–5.0)
Alkaline Phosphatase: 94 U/L (ref 38–126)
Anion gap: 6 (ref 5–15)
BUN: 18 mg/dL (ref 6–20)
CO2: 25 mmol/L (ref 22–32)
Calcium: 9.2 mg/dL (ref 8.9–10.3)
Chloride: 103 mmol/L (ref 98–111)
Creatinine, Ser: 0.84 mg/dL (ref 0.61–1.24)
GFR, Estimated: >60 mL/min (ref >60)
Glucose, Bld: 92 mg/dL (ref 70–99)
Potassium: 3.8 mmol/L (ref 3.5–5.1)
Sodium: 134 mmol/L — ABNORMAL LOW (ref 135–145)
Total Bilirubin: 0.9 mg/dL (ref 0.3–1.2)
Total Protein: 7.4 g/dL (ref 6.5–8.1)

## 2021-03-09 LAB — MAGNESIUM: Magnesium: 2.2 mg/dL (ref 1.7–2.4)

## 2021-03-09 LAB — PSA: Prostatic Specific Antigen: 0.05 ng/mL (ref 0.00–4.00)

## 2021-03-13 NOTE — Progress Notes (Signed)
Randall Dawson,  [id number]   CLINIC:  Medical Oncology/Hematology  PCP:  Glenda Chroman, MD [address] 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 The report date is 03/15/2020.   The Common Hereditary Cancers Panel offered by [facility] includes sequencing and/or deletion duplication testing of the following 47 genes: APC, ATM, AXIN2, BARD1, BMPR1A, BRCA1, BRCA2, BRIP1, CDH1, CDK4, CDKN2A (p14ARF), CDKN2A (p16INK4a), CHEK2, CTNNA1, DICER1, EPCAM (Deletion/duplication testing only), GREM1 (promoter region deletion/duplication testing only), KIT, MEN1, MLH1, MSH2, MSH3, MSH6, MUTYH, NBN, NF1, NTHL1, PALB2, PDGFRA, PMS2, POLD1, POLE, PTEN, RAD50, RAD51C, RAD51D, SDHB, SDHC, SDHD, SMAD4, SMARCA4. STK11, TP53, TSC1, TSC2, and VHL.  The following genes were evaluated for sequence changes only: SDHA and HOXB13 c.251G>A variant only. The Prostate Cancer HRR Panel offered by [facility] includes sequencing and/or deletion/duplication analysis of the following 10 genes: ATM, BARD1, BRCA1, BRCA2, BRIP1, CHEK2, FANCL, PALB2, RAD51C, RAD51D.     CANCER STAGING: Cancer Staging No matching staging information was found for the patient.  INTERVAL HISTORY:  [name], a 59 y.o. male, returns for routine follow-up of his metastatic prostate cancer. Micahel was last seen on 10/15/20.   Today he reports  feeling well. He is taking Zytiga and tolerating it well, and he denies hot flashes. He reports occasional dizziness, constipation, and ankle swellings.   REVIEW OF SYSTEMS:  Review of Systems  Constitutional:  Positive for fatigue (50%). Negative for appetite change.  Cardiovascular:  Positive for leg swelling (ankles).  Gastrointestinal:  Positive for constipation.  Endocrine: Negative for hot flashes.  Genitourinary:  Positive for frequency.   Musculoskeletal:  Positive for arthralgias (4/10 ankles and hips).  Neurological:  Positive for dizziness.  Psychiatric/Behavioral:  Positive for depression and sleep disturbance. The patient is nervous/anxious.   All other systems reviewed and are negative.  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 [facility])    Past Surgical History:  Procedure Laterality Date   FOREIGN BODY REMOVAL N/A 07/13/2017   Procedure: FOREIGN BODY REMOVAL ADULT;  Surgeon: Helayne Seminole, MD;  Location: [facility];  Service: ENT;  Laterality: N/A;   LEG SURGERY     rods in leg from fracture   RIGID ESOPHAGOSCOPY N/A 07/13/2017   Procedure: RIGID ESOPHAGOSCOPY;  Surgeon: Helayne Seminole, MD;  Location: [facility] OR;  Service: ENT;  Laterality: N/A;    SOCIAL HISTORY:  Social History   Socioeconomic History   Marital status: Divorced    Spouse name: Not on file   Number of children: 0   Years of education: Not on file   Highest education level: Not on file  Occupational History   Occupation: EMPLOYED  Tobacco Use  Smoking status: Never   Smokeless tobacco: Never  Vaping Use   Vaping Use: Never used  Substance and Sexual Activity   Alcohol use: No   Drug use: No   Sexual activity: Not on file  Other Topics Concern   Not on file   Social History Narrative   Not on file   Social Determinants of Health   Financial Resource Strain: Not on file  Food Insecurity: Not on file  Transportation Needs: Not on file  Physical Activity: Not on file  Stress: Not on file  Social Connections: Not on file  Intimate Partner Violence: Not on file    FAMILY HISTORY:  Family History  Problem Relation Age of Onset   Dementia Mother    Prostate cancer Father 20       metastatic   Cancer Sister        cancer on leg, dx. in her 34s   Lymphoma Brother 34   Lung cancer Paternal Aunt        dx. >50    CURRENT MEDICATIONS:  Current Outpatient Medications  Medication Sig Dispense Refill   abiraterone acetate (ZYTIGA) 250 MG tablet Take 4 tablets (1,000 mg total) by mouth daily. Take on an empty stomach 1 hour before or 2 hours after a meal 120 tablet 11   butalbital-acetaminophen-caffeine (FIORICET) 50-325-40 MG tablet Take 1-2 tablets by mouth every 6 (six) hours as needed for headache. 20 tablet 0   ondansetron (ZOFRAN ODT) 4 MG disintegrating tablet Take 1 tablet (4 mg total) by mouth every 8 (eight) hours as needed for nausea or vomiting. 20 tablet 0   predniSONE (DELTASONE) 5 MG tablet Take 1 tablet (5 mg total) by mouth 2 (two) times daily with a meal. 60 tablet 11   prochlorperazine (COMPAZINE) 10 MG tablet Take 1 tablet (10 mg total) by mouth every 6 (six) hours as needed for nausea or vomiting. 60 tablet 2   No current facility-administered medications for this visit.    ALLERGIES:  No Known Allergies  PHYSICAL EXAM:  Performance status (ECOG): 0 - Asymptomatic  There were no vitals filed for this visit. Wt Readings from Last 3 Encounters:  10/15/20 236 lb (107 kg)  08/24/20 236 lb 2 oz (107.1 kg)  07/21/20 241 lb 1.6 oz (109.4 kg)   Physical Exam Vitals reviewed.  Constitutional:      Appearance: Normal appearance.  Cardiovascular:     Rate and Rhythm: Normal rate and regular rhythm.     Pulses: Normal  pulses.     Heart sounds: Normal heart sounds.  Pulmonary:     Effort: Pulmonary effort is normal.     Breath sounds: Normal breath sounds.  Neurological:     General: No focal deficit present.     Mental Status: He is alert and oriented to person, place, and time.  Psychiatric:        Mood and Affect: Mood normal.        Behavior: Behavior normal.     LABORATORY DATA:  I have reviewed the labs as listed.  CBC Latest Ref Rng & Units 03/09/2021 12/17/2020 10/08/2020  WBC 4.0 - 10.5 K/uL 7.8 6.3 8.9  Hemoglobin 13.0 - 17.0 g/dL 12.8(L) 12.6(L) 12.8(L)  Hematocrit 39.0 - 52.0 % 39.7 39.2 39.6  Platelets 150 - 400 K/uL 268 253 283   CMP Latest Ref Rng & Units 03/09/2021 12/17/2020 10/08/2020  Glucose 70 - 99 mg/dL 92 102(H) 105(H)  BUN 6 - 20 mg/dL  18 22(H) 20  Creatinine 0.61 - 1.24 mg/dL 0.84 0.81 0.80  Sodium 135 - 145 mmol/L 134(L) 138 139  Potassium 3.5 - 5.1 mmol/L 3.8 4.1 3.9  Chloride 98 - 111 mmol/L 103 104 108  CO2 22 - 32 mmol/L 25 30 24   Calcium 8.9 - 10.3 mg/dL 9.2 9.1 9.3  Total Protein 6.5 - 8.1 g/dL 7.4 6.8 7.3  Total Bilirubin 0.3 - 1.2 mg/dL 0.9 0.7 0.4  Alkaline Phos 38 - 126 U/L 94 81 83  AST 15 - 41 U/L 17 15 16   ALT 0 - 44 U/L 9 8 11     DIAGNOSTIC IMAGING:  I have independently reviewed the scans and discussed with the patient. No results found.   ASSESSMENT:  1.  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  Serum testosterone on 12/17/2019 of 197. -Bone scan on 12/18/2019 at [facility] shows small focus of uptake seen left inferior orbit favoring benign etiology.  No suspicious foci of bone meta stasis. -CTAP with contrast on 12/18/2019 showed retroperitoneal and bilateral iliac chain metastatic adenopathy.  Left para-aortic lymph node measures 15 mm.  Right external iliac lymph node measures 22 x 35 mm.  Left external iliac lymph node  measures 26 x 35 mm.  Ill-defined hypoenhancing 12 mm hepatic lesion, indeterminate, however concerning for metastasis.  Sclerotic focus in the right posterior ilium, indeterminate. -MRI of the abdomen on 01/10/2020 shows tiny 8 mm low-attenuation lesion in the posterior right hepatic lobe, too small to characterize.  No other significant abnormalities. -Abiraterone and prednisone started around 02/01/2020.  Degarelix started on 01/29/2020. -Lupron 45 mg on 02/26/2020.   2.  Family history: -Father died of prostate cancer at age 25.  Sister had cancer on her leg resected.  Paternal uncle had cancer, patient does not know the type.   PLAN:  1.  Metastatic castration sensitive prostate cancer: - He has minor hot flashes but denies any arthralgias. - Reviewed his labs from 03/09/2021 which showed normal LFTs.  PSA has further improved to 0.05 from 0.06. - He will receive Lupron injection today. - Continue Zytiga 1000 mg daily and prednisone 5 mg daily. - RTC 2 months for follow-up.   2.  Family history: - Germline testing was negative.   3.  Bilateral hip pains: - He had previous MRI which was negative for metastatic disease.   4.  Constipation: - Continue stool softener and milk of magnesia.      Orders placed this encounter:  No orders of the defined types were placed in this encounter.    Derek Jack, MD [place] [phone]   I, Thana Ates, am acting as a scribe for Dr. Derek Jack.  I, Derek Jack MD, have reviewed the above documentation for accuracy and completeness, and I agree with the above.

## 2021-03-15 ENCOUNTER — Inpatient Hospital Stay (HOSPITAL_COMMUNITY): Payer: 59

## 2021-03-15 ENCOUNTER — Other Ambulatory Visit: Payer: Self-pay

## 2021-03-15 ENCOUNTER — Inpatient Hospital Stay (HOSPITAL_BASED_OUTPATIENT_CLINIC_OR_DEPARTMENT_OTHER): Payer: 59 | Admitting: Hematology

## 2021-03-15 VITALS — BP 140/88 | HR 94 | Temp 98.0°F | Resp 18 | Wt 233.8 lb

## 2021-03-15 DIAGNOSIS — C772 Secondary and unspecified malignant neoplasm of intra-abdominal lymph nodes: Secondary | ICD-10-CM

## 2021-03-15 DIAGNOSIS — C61 Malignant neoplasm of prostate: Secondary | ICD-10-CM

## 2021-03-15 MED ORDER — LEUPROLIDE ACETATE (6 MONTH) 45 MG ~~LOC~~ KIT
45.0000 mg | PACK | Freq: Once | SUBCUTANEOUS | Status: AC
  Administered 2021-03-15: 45 mg via SUBCUTANEOUS
  Filled 2021-03-15: qty 45

## 2021-03-15 NOTE — Patient Instructions (Signed)
Leuprolide injection What is this medication? LEUPROLIDE (loo PROE lide) is a man-made hormone. It is used to treat the symptoms of prostate cancer. This medicine may also be used to treat childrenwith early onset of puberty. It may be used for other hormonal conditions. This medicine may be used for other purposes; ask your health care provider orpharmacist if you have questions. COMMON BRAND NAME(S): Lupron What should I tell my care team before I take this medication? They need to know if you have any of these conditions: diabetes heart disease or previous heart attack high blood pressure high cholesterol pain or difficulty passing urine spinal cord metastasis stroke tobacco smoker an unusual or allergic reaction to leuprolide, benzyl alcohol, other medicines, foods, dyes, or preservatives pregnant or trying to get pregnant breast-feeding How should I use this medication? This medicine is for injection under the skin or into a muscle. You will be taught how to prepare and give this medicine. Use exactly as directed. Take your medicine at regular intervals. Do not take your medicine more often thandirected. It is important that you put your used needles and syringes in a special sharps container. Do not put them in a trash can. If you do not have a sharpscontainer, call your pharmacist or healthcare provider to get one. A special MedGuide will be given to you by the pharmacist with eachprescription and refill. Be sure to read this information carefully each time. Talk to your pediatrician regarding the use of this medicine in children. While this medicine may be prescribed for children as young as 8 years for selectedconditions, precautions do apply. Overdosage: If you think you have taken too much of this medicine contact apoison control center or emergency room at once. NOTE: This medicine is only for you. Do not share this medicine with others. What if I miss a dose? If you miss a  dose, take it as soon as you can. If it is almost time for yournext dose, take only that dose. Do not take double or extra doses. What may interact with this medication? Do not take this medicine with any of the following medications: chasteberry cisapride dronedarone pimozide thioridazine This medicine may also interact with the following medications: herbal or dietary supplements, like black cohosh or DHEA male hormones, like estrogens or progestins and birth control pills, patches, rings, or injections male hormones, like testosterone other medicines that prolong the QT interval (abnormal heart rhythm) This list may not describe all possible interactions. Give your health care provider a list of all the medicines, herbs, non-prescription drugs, or dietary supplements you use. Also tell them if you smoke, drink alcohol, or use illegaldrugs. Some items may interact with your medicine. What should I watch for while using this medication? Visit your doctor or health care professional for regular checks on your progress. During the first week, your symptoms may get worse, but then will improve as you continue your treatment. You may get hot flashes, increased bone pain, increased difficulty passing urine, or an aggravation of nerve symptoms. Discuss these effects with your doctor or health care professional, some ofthem may improve with continued use of this medicine. Male patients may experience a menstrual cycle or spotting during the first 2 months of therapy with this medicine. If this continues, contact your doctor orhealth care professional. This medicine may increase blood sugar. Ask your healthcare provider if changesin diet or medicines are needed if you have diabetes. What side effects may I notice from receiving this medication? Side   effects that you should report to your doctor or health care professionalas soon as possible: allergic reactions like skin rash, itching or hives,  swelling of the face, lips, or tongue breathing problems chest pain depression or memory disorders pain in your legs or groin pain at site where injected severe headache signs and symptoms of high blood sugar such as being more thirsty or hungry or having to urinate more than normal. You may also feel very tired or have blurry vision swelling of the feet and legs visual changes vomiting Side effects that usually do not require medical attention (report to yourdoctor or health care professional if they continue or are bothersome): breast swelling or tenderness decrease in sex drive or performance diarrhea hot flashes loss of appetite muscle, joint, or bone pains nausea redness or irritation at site where injected skin problems or acne This list may not describe all possible side effects. Call your doctor for medical advice about side effects. You may report side effects to FDA at1-800-FDA-1088. Where should I keep my medication? Keep out of the reach of children. Store below 25 degrees C (77 degrees F). Do not freeze. Protect from light. Do not use if it is not clear or if there are particles present. Throw away anyunused medicine after the expiration date. NOTE: This sheet is a summary. It may not cover all possible information. If you have questions about this medicine, talk to your doctor, pharmacist, orhealth care provider.  2022 Elsevier/Gold Standard (2019-06-12 10:57:41)  

## 2021-03-15 NOTE — Progress Notes (Signed)
Patient is taking Zytiga as prescribed.  They have not missed any doses and report no side effects at this time.

## 2021-03-15 NOTE — Progress Notes (Signed)
Lupron injection given per order in L upper arm.  Site clean, dry and intact.  Patient discharged to home ambulatory in stable condition.

## 2021-03-15 NOTE — Patient Instructions (Addendum)
[facility] Discharge Instructions  You were seen today by Dr. Katragadda. He went over your recent results. Dr. Katragadda will see you back in 2 months for labs and follow up.   Thank you for choosing [facility] at [facility] to provide your oncology and hematology care.  To afford each patient quality time with our provider, please arrive at least 15 minutes before your scheduled appointment time.   If you have a lab appointment with the Cancer Center please come in thru the Main Entrance and check in at the main information desk  You need to re-schedule your appointment should you arrive 10 or more minutes late.  We strive to give you quality time with our providers, and arriving late affects you and other patients whose appointments are after yours.  Also, if you no show three or more times for appointments you may be dismissed from the clinic at the providers discretion.     Again, thank you for choosing [facility].  Our hope is that these requests will decrease the amount of time that you wait before being seen by our physicians.       _____________________________________________________________  Should you have questions after your visit to [facility], please contact our office at [phone] between the hours of 8:00 a.m. and 4:30 p.m.  Voicemails left after 4:00 p.m. will not be returned until the following business day.  For prescription refill requests, have your pharmacy contact our office and allow 72 hours.    Cancer Center Support Programs:   > Cancer Support Group  2nd Tuesday of the month 1pm-2pm, Journey Room   

## 2021-03-19 ENCOUNTER — Telehealth: Payer: Self-pay | Admitting: General Practice

## 2021-03-19 NOTE — Telephone Encounter (Signed)
[facility] CSW PRogress Notes  Request from Stevphen Rochester to call patient due to concerns identified in SDOH screening.  Called patient, no answer, left generic VM w my contact information and encouragement to return my call.  Edwyna Shell, LCSW Clinical Social Worker Phone:  [phone]

## 2021-03-25 ENCOUNTER — Telehealth: Payer: Self-pay | Admitting: General Practice

## 2021-03-25 ENCOUNTER — Encounter: Payer: Self-pay | Admitting: General Practice

## 2021-03-25 NOTE — Telephone Encounter (Addendum)
[facility] CSW Progress Notes  Second call to patient to address SDOH concerns identified on screening.  No answer, left generic VM w my contact information and encouragement to call back if desired.  Edwyna Shell, LCSW Clinical Social Worker Phone:  [phone]

## 2021-03-25 NOTE — Progress Notes (Signed)
[facility] CSW Progress Notes  Call from patient, he has two concerns.  First, his legs are swelling and painful - he finds himself less active due to pain on walking.  He has gone to an orthopedic specialist by referral from Dr Delton Coombes and was glad to learn that his cancer had not metastasized and caused this pain.  He wants to follow up w his PCP to determine if there are other explanations for his leg pain/weakness as he would like to be more physically active.  He also wants to know if he should continue his [employer] coverage as he was recently granted [facility], a form of Medicaid.  Advised him to call this entity and discuss what would be covered if he drops his commercial insurance.  Also encouraged him to provide a copy of his card at his next visit.  He has support in the community from family/friends.    Edwyna Shell, LCSW Clinical Social Worker Phone:  [phone]

## 2021-05-10 ENCOUNTER — Inpatient Hospital Stay (HOSPITAL_COMMUNITY): Payer: 59 | Attending: Hematology

## 2021-05-10 ENCOUNTER — Other Ambulatory Visit: Payer: Self-pay

## 2021-05-10 DIAGNOSIS — Z806 Family history of leukemia: Secondary | ICD-10-CM | POA: Diagnosis not present

## 2021-05-10 DIAGNOSIS — M25551 Pain in right hip: Secondary | ICD-10-CM | POA: Diagnosis not present

## 2021-05-10 DIAGNOSIS — C772 Secondary and unspecified malignant neoplasm of intra-abdominal lymph nodes: Secondary | ICD-10-CM

## 2021-05-10 DIAGNOSIS — C61 Malignant neoplasm of prostate: Secondary | ICD-10-CM | POA: Insufficient documentation

## 2021-05-10 DIAGNOSIS — M25473 Effusion, unspecified ankle: Secondary | ICD-10-CM | POA: Insufficient documentation

## 2021-05-10 DIAGNOSIS — R5383 Other fatigue: Secondary | ICD-10-CM | POA: Insufficient documentation

## 2021-05-10 DIAGNOSIS — K5900 Constipation, unspecified: Secondary | ICD-10-CM | POA: Insufficient documentation

## 2021-05-10 DIAGNOSIS — Z801 Family history of malignant neoplasm of trachea, bronchus and lung: Secondary | ICD-10-CM | POA: Diagnosis not present

## 2021-05-10 DIAGNOSIS — Z8042 Family history of malignant neoplasm of prostate: Secondary | ICD-10-CM | POA: Insufficient documentation

## 2021-05-10 DIAGNOSIS — C786 Secondary malignant neoplasm of retroperitoneum and peritoneum: Secondary | ICD-10-CM | POA: Insufficient documentation

## 2021-05-10 DIAGNOSIS — Z79899 Other long term (current) drug therapy: Secondary | ICD-10-CM | POA: Diagnosis not present

## 2021-05-10 DIAGNOSIS — Z191 Hormone sensitive malignancy status: Secondary | ICD-10-CM | POA: Insufficient documentation

## 2021-05-10 DIAGNOSIS — M25552 Pain in left hip: Secondary | ICD-10-CM | POA: Diagnosis not present

## 2021-05-10 DIAGNOSIS — F329 Major depressive disorder, single episode, unspecified: Secondary | ICD-10-CM | POA: Insufficient documentation

## 2021-05-10 LAB — COMPREHENSIVE METABOLIC PANEL
ALT: 10 U/L (ref 0–44)
AST: 14 U/L — ABNORMAL LOW (ref 15–41)
Albumin: 4 g/dL (ref 3.5–5.0)
Alkaline Phosphatase: 87 U/L (ref 38–126)
Anion gap: 7 (ref 5–15)
BUN: 17 mg/dL (ref 6–20)
CO2: 26 mmol/L (ref 22–32)
Calcium: 9.2 mg/dL (ref 8.9–10.3)
Chloride: 105 mmol/L (ref 98–111)
Creatinine, Ser: 0.82 mg/dL (ref 0.61–1.24)
GFR, Estimated: >60 mL/min (ref >60)
Glucose, Bld: 106 mg/dL — ABNORMAL HIGH (ref 70–99)
Potassium: 3.9 mmol/L (ref 3.5–5.1)
Sodium: 138 mmol/L (ref 135–145)
Total Bilirubin: 0.5 mg/dL (ref 0.3–1.2)
Total Protein: 7.5 g/dL (ref 6.5–8.1)

## 2021-05-10 LAB — CBC WITH DIFFERENTIAL/PLATELET
Abs Immature Granulocytes: 0.09 10*3/uL — ABNORMAL HIGH (ref 0.00–0.07)
Basophils Absolute: 0 10*3/uL (ref 0.0–0.1)
Basophils Relative: 0 %
Eosinophils Absolute: 0.1 10*3/uL (ref 0.0–0.5)
Eosinophils Relative: 1 %
HCT: 39.1 % (ref 39.0–52.0)
Hemoglobin: 13 g/dL (ref 13.0–17.0)
Immature Granulocytes: 1 %
Lymphocytes Relative: 14 %
Lymphs Abs: 1.5 10*3/uL (ref 0.7–4.0)
MCH: 32.7 pg (ref 26.0–34.0)
MCHC: 33.2 g/dL (ref 30.0–36.0)
MCV: 98.5 fL (ref 80.0–100.0)
Monocytes Absolute: 0.7 10*3/uL (ref 0.1–1.0)
Monocytes Relative: 7 %
Neutro Abs: 8.6 10*3/uL — ABNORMAL HIGH (ref 1.7–7.7)
Neutrophils Relative %: 77 %
Platelets: 310 10*3/uL (ref 150–400)
RBC: 3.97 MIL/uL — ABNORMAL LOW (ref 4.22–5.81)
RDW: 13 % (ref 11.5–15.5)
WBC: 11.1 10*3/uL — ABNORMAL HIGH (ref 4.0–10.5)
nRBC: 0 % (ref 0.0–0.2)

## 2021-05-10 LAB — PSA: Prostatic Specific Antigen: 0.07 ng/mL (ref 0.00–4.00)

## 2021-05-10 LAB — MAGNESIUM: Magnesium: 2.3 mg/dL (ref 1.7–2.4)

## 2021-05-15 NOTE — Progress Notes (Signed)
[place] [place], 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 The report date is 03/15/2020.   The Common Hereditary Cancers Panel offered by [facility] includes sequencing and/or deletion duplication testing of the following 47 genes: APC, ATM, AXIN2, BARD1, BMPR1A, BRCA1, BRCA2, BRIP1, CDH1, CDK4, CDKN2A (p14ARF), CDKN2A (p16INK4a), CHEK2, CTNNA1, DICER1, EPCAM (Deletion/duplication testing only), GREM1 (promoter region deletion/duplication testing only), KIT, MEN1, MLH1, MSH2, MSH3, MSH6, MUTYH, NBN, NF1, NTHL1, PALB2, PDGFRA, PMS2, POLD1, POLE, PTEN, RAD50, RAD51C, RAD51D, SDHB, SDHC, SDHD, SMAD4, SMARCA4. STK11, TP53, TSC1, TSC2, and VHL.  The following genes were evaluated for sequence changes only: SDHA and HOXB13 c.251G>A variant only. The Prostate Cancer HRR Panel offered by [facility] includes sequencing and/or deletion/duplication analysis of the following 10 genes: ATM, BARD1, BRCA1, BRCA2, BRIP1, CHEK2, FANCL, PALB2, RAD51C, RAD51D.     CANCER STAGING: Cancer Staging No matching staging information was found for the patient.  INTERVAL HISTORY:  Mr. Gryffin Altice, a 60 y.o. 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  Gillermo was last seen on 03/15/2021.   Today he reports feeling good. He is taking Zytiga and prednisone as prescribed and he is tolerating them well. He reports constipation for which he is taking stool softener. He denies any infections or antibiotic use in the past 2 months. He monitors his blood pressure at home. He report occasional fatigue, but he denies any falls. He reports joint pain which is worst in his hips and occasional ankle swellings.   REVIEW OF SYSTEMS:  Review of Systems  Constitutional:  Positive for fatigue (60%). Negative for appetite change.  Cardiovascular:  Positive for leg swelling (ankle).  Gastrointestinal:  Positive for constipation.  Genitourinary:  Positive for frequency.   Musculoskeletal:  Positive for arthralgias (3/10).  Neurological:  Positive for dizziness.  Psychiatric/Behavioral:  Positive for depression and sleep disturbance. The patient is nervous/anxious.   All other systems reviewed and are negative.  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 ([place])    Past Surgical History:  Procedure Laterality Date   FOREIGN BODY REMOVAL N/A 07/13/2017   Procedure: FOREIGN BODY REMOVAL ADULT;  Surgeon: Helayne Seminole, MD;  Location: [facility];  Service: ENT;  Laterality: N/A;   LEG SURGERY     rods in leg from fracture   RIGID ESOPHAGOSCOPY N/A 07/13/2017   Procedure: RIGID ESOPHAGOSCOPY;  Surgeon: Helayne Seminole, MD;  Location: [facility];  Service: ENT;  Laterality: N/A;    SOCIAL HISTORY:  Social History   Socioeconomic History   Marital status: Single  Spouse name: Not on file   Number of children: 0   Years of education: Not on file   Highest education level: Not on file  Occupational History   Occupation: EMPLOYED   Tobacco Use   Smoking status: Never   Smokeless tobacco: Never  Vaping Use   Vaping Use: Never used  Substance and Sexual Activity   Alcohol use: No   Drug use: No   Sexual activity: Not on file  Other Topics Concern   Not on file  Social History Narrative   Not on file   Social Determinants of Health   Financial Resource Strain: Medium Risk   Difficulty of Paying Living Expenses: Somewhat hard  Food Insecurity: No Food Insecurity   Worried About Running Out of Food in the Last Year: Never true   Ran Out of Food in the Last Year: Never true  Transportation Needs: No Transportation Needs   Lack of Transportation (Medical): No   Lack of Transportation (Non-Medical): No  Physical Activity: Inactive   Days of Exercise per Week: 0 days   Minutes of Exercise per Session: 0 min  Stress: Stress Concern Present   Feeling of Stress : Rather much  Social Connections: Socially Isolated   Frequency of Communication with Friends and Family: Never   Frequency of Social Gatherings with Friends and Family: Never   Attends Religious Services: Never   Marine scientist or Organizations: No   Attends Music therapist: Never   Marital Status: Divorced  Human resources officer Violence: Not At Risk   Fear of Current or Ex-Partner: No   Emotionally Abused: No   Physically Abused: No   Sexually Abused: No    FAMILY HISTORY:  Family History  Problem Relation Age of Onset   Dementia Mother    Prostate cancer Father 68       metastatic   Cancer Sister        cancer on leg, dx. in her 7s   Lymphoma Brother 31   Lung cancer Paternal Aunt        dx. >50    CURRENT MEDICATIONS:  Current Outpatient Medications  Medication Sig Dispense Refill   abiraterone acetate (ZYTIGA) 250 MG tablet Take 4 tablets (1,000 mg total) by mouth daily. Take on an empty stomach 1 hour before or 2 hours after a meal 120 tablet 11   butalbital-acetaminophen-caffeine (FIORICET) 50-325-40 MG tablet  Take 1-2 tablets by mouth every 6 (six) hours as needed for headache. 20 tablet 0   ondansetron (ZOFRAN ODT) 4 MG disintegrating tablet Take 1 tablet (4 mg total) by mouth every 8 (eight) hours as needed for nausea or vomiting. (Patient not taking: Reported on 03/15/2021) 20 tablet 0   predniSONE (DELTASONE) 5 MG tablet Take 1 tablet (5 mg total) by mouth 2 (two) times daily with a meal. 60 tablet 11   prochlorperazine (COMPAZINE) 10 MG tablet Take 1 tablet (10 mg total) by mouth every 6 (six) hours as needed for nausea or vomiting. (Patient not taking: Reported on 03/15/2021) 60 tablet 2   No current facility-administered medications for this visit.    ALLERGIES:  No Known Allergies  PHYSICAL EXAM:  Performance status (ECOG): 0 - Asymptomatic  There were no vitals filed for this visit. Wt Readings from Last 3 Encounters:  03/15/21 233 lb 12.8 oz (106.1 kg)  10/15/20 236 lb (107 kg)  08/24/20 236 lb 2 oz (107.1 kg)   Physical Exam Vitals reviewed.  Constitutional:  Appearance: Normal appearance.  Cardiovascular:     Rate and Rhythm: Normal rate and regular rhythm.     Pulses: Normal pulses.     Heart sounds: Normal heart sounds.  Pulmonary:     Effort: Pulmonary effort is normal.     Breath sounds: Normal breath sounds.  Musculoskeletal:     Right lower leg: No edema.     Left lower leg: No edema.  Neurological:     General: No focal deficit present.     Mental Status: He is alert and oriented to person, place, and time.  Psychiatric:        Mood and Affect: Mood normal.        Behavior: Behavior normal.     LABORATORY DATA:  I have reviewed the labs as listed.  CBC Latest Ref Rng & Units 05/10/2021 03/09/2021 12/17/2020  WBC 4.0 - 10.5 K/uL 11.1(H) 7.8 6.3  Hemoglobin 13.0 - 17.0 g/dL 13.0 12.8(L) 12.6(L)  Hematocrit 39.0 - 52.0 % 39.1 39.7 39.2  Platelets 150 - 400 K/uL 310 268 253   CMP Latest Ref Rng & Units 05/10/2021 03/09/2021 12/17/2020  Glucose 70 - 99 mg/dL  106(H) 92 102(H)  BUN 6 - 20 mg/dL 17 18 22(H)  Creatinine 0.61 - 1.24 mg/dL 0.82 0.84 0.81  Sodium 135 - 145 mmol/L 138 134(L) 138  Potassium 3.5 - 5.1 mmol/L 3.9 3.8 4.1  Chloride 98 - 111 mmol/L 105 103 104  CO2 22 - 32 mmol/L _0 Calcium 8.9 - 10.3 mg/dL 9.2 9.2 9.1  Total Protein 6.5 - 8.1 g/dL 7.5 7.4 6.8  Total Bilirubin 0.3 - 1.2 mg/dL 0.5 0.9 0.7  Alkaline Phos 38 - 126 U/L 87 94 81  AST 15 - 41 U/L 14(L) 17 15  ALT 0 - 44 U/L _1 DIAGNOSTIC IMAGING:  I have independently reviewed the scans and discussed with the patient. No results found.   ASSESSMENT:  1.  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  Serum testosterone on 12/17/2019 of 197. -Bone scan on 12/18/2019 at [facility] shows small focus of uptake seen left inferior orbit favoring benign etiology.  No suspicious foci of bone meta stasis. -CTAP with contrast on 12/18/2019 showed retroperitoneal and bilateral iliac chain metastatic adenopathy.  Left para-aortic lymph node measures 15 mm.  Right external iliac lymph node measures 22 x 35 mm.  Left external iliac lymph node measures 26 x 35 mm.  Ill-defined hypoenhancing 12 mm hepatic lesion, indeterminate, however concerning for metastasis.  Sclerotic focus in the right posterior ilium, indeterminate. -MRI of the abdomen on 01/10/2020 shows tiny 8 mm low-attenuation lesion in the posterior right hepatic lobe, too small to characterize.  No other significant abnormalities. -Abiraterone and prednisone started around 02/01/2020.  Degarelix started on 01/29/2020. -Lupron 45 mg on 02/26/2020.   2.  Family history: -Father died of prostate cancer at age 50.  Sister had cancer on her leg resected.  Paternal uncle had cancer, patient does not know the type.   PLAN:  1.  Metastatic castration sensitive prostate cancer: - He is tolerating Zytiga  very well. - Last Lupron injection on 03/15/2021. - Reviewed labs from 05/10/2021.  PSA slightly increased to 0.07, up from 0.05 in August. - As it is a very minimal change, we will continue to monitor. - Continue Zytiga 1000 mg daily along with prednisone  5 mg twice daily. - Blood pressure is within normal limits. - RTC 2 months with repeat labs.   2.  Family history: - Germline mutation testing was negative.   3.  Bilateral hip pains: - He has joint pains involving multiple joints.  Previous MRI was negative for metastatic disease.   4.  Constipation: - Continue stool softener and milk of magnesia.  5.  Bone health: - He does not have any bone metastasis. - We will check DEXA scan and vitamin D level prior to next visit.   Orders placed this encounter:  No orders of the defined types were placed in this encounter.    Derek Jack, MD [place] [phone]   I, Thana Ates, am acting as a scribe for Dr. Derek Jack.  I, Derek Jack MD, have reviewed the above documentation for accuracy and completeness, and I agree with the above.

## 2021-05-17 ENCOUNTER — Inpatient Hospital Stay (HOSPITAL_BASED_OUTPATIENT_CLINIC_OR_DEPARTMENT_OTHER): Payer: 59 | Admitting: Hematology

## 2021-05-17 ENCOUNTER — Other Ambulatory Visit: Payer: Self-pay

## 2021-05-17 DIAGNOSIS — C61 Malignant neoplasm of prostate: Secondary | ICD-10-CM

## 2021-05-17 DIAGNOSIS — C772 Secondary and unspecified malignant neoplasm of intra-abdominal lymph nodes: Secondary | ICD-10-CM

## 2021-05-17 NOTE — Patient Instructions (Signed)
[facility] at [facility] Discharge Instructions  You were seen and examined today by Dr. Delton Coombes. He reviewed your most recent labs and everything looks good. He wants you to continue your medications as prescribed for now. He is going to check a bone density scan since the medications that you are on can cause bone loss. Please follow up as scheduled.   Thank you for choosing [place] at [facility] to provide your oncology and hematology care.  To afford each patient quality time with our provider, please arrive at least 15 minutes before your scheduled appointment time.   If you have a lab appointment with the [facility] please come in thru the Main Entrance and check in at the main information desk.  You need to re-schedule your appointment should you arrive 10 or more minutes late.  We strive to give you quality time with our providers, and arriving late affects you and other patients whose appointments are after yours.  Also, if you no show three or more times for appointments you may be dismissed from the clinic at the providers discretion.     Again, thank you for choosing [facility].  Our hope is that these requests will decrease the amount of time that you wait before being seen by our physicians.       _____________________________________________________________  Should you have questions after your visit to [facility], please contact our office at [phone] and follow the prompts.  Our office hours are 8:00 a.m. and 4:30 p.m. Monday - Friday.  Please note that voicemails left after 4:00 p.m. may not be returned until the following business day.  We are closed weekends and major holidays.  You do have access to a nurse 24-7, just call the main number to the clinic [phone] and do not press any options, hold on the line and a nurse will answer the phone.    For prescription refill requests, have  your pharmacy contact our office and allow 72 hours.    Due to Covid, you will need to wear a mask upon entering the hospital. If you do not have a mask, a mask will be given to you at the Main Entrance upon arrival. For doctor visits, patients may have 1 support person age 71 or older with them. For treatment visits, patients can not have anyone with them due to social distancing guidelines and our immunocompromised population.

## 2021-06-07 ENCOUNTER — Encounter (HOSPITAL_COMMUNITY): Payer: Self-pay | Admitting: Hematology

## 2021-06-29 ENCOUNTER — Other Ambulatory Visit (HOSPITAL_COMMUNITY): Payer: Self-pay

## 2021-06-29 MED ORDER — BUTALBITAL-APAP-CAFFEINE 50-325-40 MG PO TABS: 1.0000 | ORAL_TABLET | Freq: Four times a day (QID) | ORAL | 0 refills | Status: AC | PRN

## 2021-07-15 ENCOUNTER — Other Ambulatory Visit: Payer: Self-pay

## 2021-07-15 ENCOUNTER — Encounter (HOSPITAL_COMMUNITY): Payer: Self-pay | Admitting: Hematology

## 2021-07-15 ENCOUNTER — Inpatient Hospital Stay (HOSPITAL_COMMUNITY): Payer: 59 | Attending: Hematology

## 2021-07-15 ENCOUNTER — Ambulatory Visit (HOSPITAL_COMMUNITY)
Admission: RE | Admit: 2021-07-15 | Discharge: 2021-07-15 | Disposition: A | Discharge status: Home / Self Care | Payer: 59 | Source: Ambulatory Visit | Attending: Hematology | Admitting: Hematology

## 2021-07-15 DIAGNOSIS — C772 Secondary and unspecified malignant neoplasm of intra-abdominal lymph nodes: Secondary | ICD-10-CM | POA: Diagnosis present

## 2021-07-15 DIAGNOSIS — C61 Malignant neoplasm of prostate: Secondary | ICD-10-CM

## 2021-07-15 LAB — COMPREHENSIVE METABOLIC PANEL
ALT: 9 U/L (ref 0–44)
AST: 15 U/L (ref 15–41)
Albumin: 3.8 g/dL (ref 3.5–5.0)
Alkaline Phosphatase: 89 U/L (ref 38–126)
Anion gap: 7 (ref 5–15)
BUN: 19 mg/dL (ref 6–20)
CO2: 27 mmol/L (ref 22–32)
Calcium: 9.1 mg/dL (ref 8.9–10.3)
Chloride: 105 mmol/L (ref 98–111)
Creatinine, Ser: 0.79 mg/dL (ref 0.61–1.24)
GFR, Estimated: >60 mL/min (ref >60)
Glucose, Bld: 91 mg/dL (ref 70–99)
Potassium: 3.5 mmol/L (ref 3.5–5.1)
Sodium: 139 mmol/L (ref 135–145)
Total Bilirubin: 0.7 mg/dL (ref 0.3–1.2)
Total Protein: 7.3 g/dL (ref 6.5–8.1)

## 2021-07-15 LAB — CBC WITH DIFFERENTIAL/PLATELET
Abs Immature Granulocytes: 0.06 10*3/uL (ref 0.00–0.07)
Basophils Absolute: 0.1 10*3/uL (ref 0.0–0.1)
Basophils Relative: 1 %
Eosinophils Absolute: 0.2 10*3/uL (ref 0.0–0.5)
Eosinophils Relative: 2 %
HCT: 38.1 % — ABNORMAL LOW (ref 39.0–52.0)
Hemoglobin: 12.3 g/dL — ABNORMAL LOW (ref 13.0–17.0)
Immature Granulocytes: 1 %
Lymphocytes Relative: 25 %
Lymphs Abs: 2.3 10*3/uL (ref 0.7–4.0)
MCH: 32.7 pg (ref 26.0–34.0)
MCHC: 32.3 g/dL (ref 30.0–36.0)
MCV: 101.3 fL — ABNORMAL HIGH (ref 80.0–100.0)
Monocytes Absolute: 0.9 10*3/uL (ref 0.1–1.0)
Monocytes Relative: 10 %
Neutro Abs: 5.6 10*3/uL (ref 1.7–7.7)
Neutrophils Relative %: 61 %
Platelets: 290 10*3/uL (ref 150–400)
RBC: 3.76 MIL/uL — ABNORMAL LOW (ref 4.22–5.81)
RDW: 13 % (ref 11.5–15.5)
WBC: 9.1 10*3/uL (ref 4.0–10.5)
nRBC: 0 % (ref 0.0–0.2)

## 2021-07-15 LAB — PSA: Prostatic Specific Antigen: 0.07 ng/mL (ref 0.00–4.00)

## 2021-07-15 LAB — VITAMIN D 25 HYDROXY (VIT D DEFICIENCY, FRACTURES): Vit D, 25-Hydroxy: 33.87 ng/mL (ref 30–100)

## 2021-07-21 NOTE — Progress Notes (Signed)
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 The report date is 03/15/2020.   The Common Hereditary Cancers Panel offered by [facility] includes sequencing and/or deletion duplication testing of the following 47 genes: APC, ATM, AXIN2, BARD1, BMPR1A, BRCA1, BRCA2, BRIP1, CDH1, CDK4, CDKN2A (p14ARF), CDKN2A (p16INK4a), CHEK2, CTNNA1, DICER1, EPCAM (Deletion/duplication testing only), GREM1 (promoter region deletion/duplication testing only), KIT, MEN1, MLH1, MSH2, MSH3, MSH6, MUTYH, NBN, NF1, NTHL1, PALB2, PDGFRA, PMS2, POLD1, POLE, PTEN, RAD50, RAD51C, RAD51D, SDHB, SDHC, SDHD, SMAD4, SMARCA4. STK11, TP53, TSC1, TSC2, and VHL.  The following genes were evaluated for sequence changes only: SDHA and HOXB13 c.251G>A variant only. The Prostate Cancer HRR Panel offered by [facility] includes sequencing and/or deletion/duplication analysis of the following 10 genes: ATM, BARD1, BRCA1, BRCA2, BRIP1, CHEK2, FANCL, PALB2, RAD51C, RAD51D.     CANCER STAGING:  Cancer Staging  No matching staging information was found for the patient.  INTERVAL HISTORY:  Randall Dawson, a 60 y.o. 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  Randall Dawson was last seen on 05/17/2021.   Today he reports feeling well. He reports pain in his testicles and a knot on the inside of his left leg since 12/26. He does not have any teeth. He denies difficulty urinating, fevers, and chills. He reports lower back pain which radiates down his left leg. He reports fatigue.   REVIEW OF SYSTEMS:  Review of Systems  Constitutional:  Positive for fatigue (50%). Negative for appetite change, chills and fever.  Genitourinary:  Positive for frequency. Negative for difficulty urinating.        Testicle pain  Musculoskeletal:  Positive for back pain.  Psychiatric/Behavioral:  Positive for depression and sleep disturbance. The patient is nervous/anxious.   All other systems reviewed and are negative.  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 ([place])    Past Surgical History:  Procedure Laterality Date   FOREIGN BODY REMOVAL N/A 07/13/2017   Procedure: FOREIGN BODY REMOVAL ADULT;  Surgeon: Helayne Seminole, MD;  Location: [facility];  Service: ENT;  Laterality: N/A;   LEG SURGERY     rods in leg from fracture   RIGID ESOPHAGOSCOPY N/A 07/13/2017   Procedure: RIGID ESOPHAGOSCOPY;  Surgeon: Helayne Seminole, MD;  Location: [facility];  Service: ENT;  Laterality: N/A;    SOCIAL HISTORY:  Social History   Socioeconomic History   Marital status: Single    Spouse name: Not on file   Number of children: 0   Years of education: Not on file  Highest education level: Not on file  Occupational History   Occupation: EMPLOYED  Tobacco Use   Smoking status: Never   Smokeless tobacco: Never  Vaping Use   Vaping Use: Never used  Substance and Sexual Activity   Alcohol use: No   Drug use: No   Sexual activity: Not on file  Other  Topics Concern   Not on file  Social History Narrative   Not on file   Social Determinants of Health   Financial Resource Strain: Medium Risk   Difficulty of Paying Living Expenses: Somewhat hard  Food Insecurity: No Food Insecurity   Worried About Running Out of Food in the Last Year: Never true   Ran Out of Food in the Last Year: Never true  Transportation Needs: No Transportation Needs   Lack of Transportation (Medical): No   Lack of Transportation (Non-Medical): No  Physical Activity: Inactive   Days of Exercise per Week: 0 days   Minutes of Exercise per Session: 0 min  Stress: Stress Concern Present   Feeling of Stress : Rather much  Social Connections: Socially Isolated   Frequency of Communication with Friends and Family: Never   Frequency of Social Gatherings with Friends and Family: Never   Attends Religious Services: Never   Marine scientist or Organizations: No   Attends Music therapist: Never   Marital Status: Divorced  Human resources officer Violence: Not At Risk   Fear of Current or Ex-Partner: No   Emotionally Abused: No   Physically Abused: No   Sexually Abused: No    FAMILY HISTORY:  Family History  Problem Relation Age of Onset   Dementia Mother    Prostate cancer Father 83       metastatic   Cancer Sister        cancer on leg, dx. in her 70s   Lymphoma Brother 78   Lung cancer Paternal Aunt        dx. >50    CURRENT MEDICATIONS:  Current Outpatient Medications  Medication Sig Dispense Refill   abiraterone acetate (ZYTIGA) 250 MG tablet Take 4 tablets (1,000 mg total) by mouth daily. Take on an empty stomach 1 hour before or 2 hours after a meal 120 tablet 11   cephALEXin (KEFLEX) 500 MG capsule Take 1 capsule (500 mg total) by mouth 3 (three) times daily. 21 capsule 0   predniSONE (DELTASONE) 5 MG tablet Take 1 tablet (5 mg total) by mouth 2 (two) times daily with a meal. 60 tablet 11   butalbital-acetaminophen-caffeine (FIORICET)  50-325-40 MG tablet Take 1-2 tablets by mouth every 6 (six) hours as needed for headache. (Patient not taking: Reported on 07/22/2021) 20 tablet 0   ondansetron (ZOFRAN ODT) 4 MG disintegrating tablet Take 1 tablet (4 mg total) by mouth every 8 (eight) hours as needed for nausea or vomiting. (Patient not taking: Reported on 07/22/2021) 20 tablet 0   prochlorperazine (COMPAZINE) 10 MG tablet Take 1 tablet (10 mg total) by mouth every 6 (six) hours as needed for nausea or vomiting. (Patient not taking: Reported on 07/22/2021) 60 tablet 2   No current facility-administered medications for this visit.    ALLERGIES:  No Known Allergies  PHYSICAL EXAM:  Performance status (ECOG): 0 - Asymptomatic  Vitals:   07/22/21 0805  BP: (!) 128/91  Pulse: 95  Resp: 19  Temp: 98.1 F (36.7 C)  SpO2: 99%   Wt Readings from Last 3 Encounters:  07/22/21 237 lb 7 oz (  107.7 kg)  03/15/21 233 lb 12.8 oz (106.1 kg)  10/15/20 236 lb (107 kg)   Physical Exam Vitals reviewed.  Constitutional:      Appearance: Normal appearance. He is obese.  Cardiovascular:     Rate and Rhythm: Normal rate and regular rhythm.     Pulses: Normal pulses.     Heart sounds: Normal heart sounds.  Pulmonary:     Effort: Pulmonary effort is normal.     Breath sounds: Normal breath sounds.  Genitourinary:    Comments: Skin of perineum indurated Neurological:     General: No focal deficit present.     Mental Status: He is alert and oriented to person, place, and time.  Psychiatric:        Mood and Affect: Mood normal.        Behavior: Behavior normal.     LABORATORY DATA:  I have reviewed the labs as listed.  CBC Latest Ref Rng & Units 07/15/2021 05/10/2021 03/09/2021  WBC 4.0 - 10.5 K/uL 9.1 11.1(H) 7.8  Hemoglobin 13.0 - 17.0 g/dL 12.3(L) 13.0 12.8(L)  Hematocrit 39.0 - 52.0 % 38.1(L) 39.1 39.7  Platelets 150 - 400 K/uL 290 310 268   CMP Latest Ref Rng & Units 07/15/2021 05/10/2021 03/09/2021  Glucose 70 - 99  mg/dL 91 106(H) 92  BUN 6 - 20 mg/dL 19 17 18   Creatinine 0.61 - 1.24 mg/dL 0.79 0.82 0.84  Sodium 135 - 145 mmol/L 139 138 134(L)  Potassium 3.5 - 5.1 mmol/L 3.5 3.9 3.8  Chloride 98 - 111 mmol/L 105 105 103  CO2 22 - 32 mmol/L 27 26 25   Calcium 8.9 - 10.3 mg/dL 9.1 9.2 9.2  Total Protein 6.5 - 8.1 g/dL 7.3 7.5 7.4  Total Bilirubin 0.3 - 1.2 mg/dL 0.7 0.5 0.9  Alkaline Phos 38 - 126 U/L 89 87 94  AST 15 - 41 U/L 15 14(L) 17  ALT 0 - 44 U/L 9 10 9     DIAGNOSTIC IMAGING:  I have independently reviewed the scans and discussed with the patient. DG Bone Density  Result Date: 07/15/2021 EXAM: DUAL X-RAY ABSORPTIOMETRY (DXA) FOR BONE MINERAL DENSITY IMPRESSION: Your patient Randall Dawson completed a BMD test on 07/15/2021 using the [place] (software version: 14.10) manufactured by [employer]. The following summarizes the results of our evaluation. Technologist: [facility] PATIENT BIOGRAPHICAL: Name: Randall Dawson, Randall Dawson Patient ID: [id number] Birth Date: Jul 21, 1961 Height: 69.0 in. Gender: Male Exam Date: 07/15/2021 Weight: 233.8 lbs. Indications: Caucasian, Chronic Steroid Use, History of Fracture (Adult), Prostate Cancer, Glucocorticoids (Chronic) Fractures: Tib Fib Treatments: DENSITOMETRY RESULTS: Site         Region      Measured Date Measured Age WHO Classification Young Adult T-score BMD         %Change vs. Previous Significant Change (*) DualFemur Total Right 07/15/2021 60.3 N/A -1.7 0.862 g/cm2 - - DualFemur Total Mean 07/15/2021 60.3 N/A -1.5 0.880 g/cm2 - - Left Forearm Radius 33% 07/15/2021 60.3 N/A 0.3 0.834 g/cm2 - - ASSESSMENT: BMD as determined from Femur Total Right is 0.862 g/cm2 with a T-score of -1.7. This patient is considered osteopenic by World Healh Organization (WHO) Criteria. The scan quality is good. Lumbar spine was excluded due to advanced degenerative changes. World Pharmacologist [facility]) criteria for post-menopausal, Caucasian Women: Normal:        T-score at or above -1 SD Osteopenia:   T-score between -1 and -2.5 SD Osteoporosis: T-score at or below -2.5  SD RECOMMENDATIONS: 1. All patients should optimize calcium and vitamin D intake. 2. Consider FDA-approved medical therapies in postmenopausal women and med aged 63 years and older, based on the following: a. A hip or vertebral (clinical or morphometric) fracture b. T-score< -2.5 at the femoral neck or spine after appropriate evaluation to exclude secondary causes c. Low bone mass (T-score between -1.0 and -2.5 at the femoral neck or spine) and a 10-year probability of a hip fracture > 3% or a 10-year probability of a major osteoporosis-related fracture > 20% based on the [place]-adapted WHO algorithm d. Clinician judgment and/or patient preferences may indicate treatment for people with 10-year fracture probabilities above or below these levels FOLLOW-UP: People with diagnosed cases of osteoporosis or osteopenia should be regularly tested for bone mineral density. For patients eligible for Medicare, routine testing is allowed once every 2 years. Testing frequency can be increased for patients who have rapidly progressing disease, or for those who are receiving medical therapy to restore bone mass. I have reviewed this report, and agree with the above findings. [facility] Your patient Randall Dawson completed a FRAX assessment on 07/15/2021 using the [place] (analysis version: 14.10) manufactured by [employer]. The following summarizes the results of our evaluation. PATIENT BIOGRAPHICAL: Name: Randall Dawson, Randall Dawson Patient ID: [id number] Birth Date: Nov 07, 1960 Height:    69.0 in. Gender:     Male      [age]       Weight:    233.8 lbs. Ethnicity:  White                            Exam Date: 07/15/2021 FRAX* RESULTS:  (version: 3.5) 10-year Probability of Fracture1 Major Osteoporotic Fracture2 Hip Fracture 14.2% 1.7% Population: [place] (Caucasian) Risk Factors: History of  Fracture (Adult), Glucocorticoids (Chronic) Based on Femur (Right) Neck BMD 1 -The 10-year probability of fracture may be lower than reported if the patient has received treatment. 2 -Major Osteoporotic Fracture: Clinical Spine, Forearm, Hip or Shoulder *FRAX is a Materials engineer of the [employer] for Metabolic Bone Disease, a [place] (WHO) [facility]. ASSESSMENT: The probability of a major osteoporotic fracture is 14.2% within the next ten years. The probability of a hip fracture is 1.7% within the next ten years. Electronically Signed   By: Rolm Baptise M.D.   On: 07/15/2021 11:13     ASSESSMENT:  1.  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  Serum testosterone on 12/17/2019 of 197. -Bone scan on 12/18/2019 at [facility] shows small focus of uptake seen left inferior orbit favoring benign etiology.  No suspicious foci of bone meta stasis. -CTAP with contrast on 12/18/2019 showed retroperitoneal and bilateral iliac chain metastatic adenopathy.  Left para-aortic lymph node measures 15 mm.  Right external iliac lymph node measures 22 x 35 mm.  Left external iliac lymph node measures 26 x 35 mm.  Ill-defined hypoenhancing 12 mm hepatic lesion, indeterminate, however concerning for metastasis.  Sclerotic focus in the right posterior ilium, indeterminate. -MRI of the abdomen on 01/10/2020 shows tiny 8 mm low-attenuation lesion in the posterior right hepatic lobe, too small to characterize.  No other significant abnormalities. -Abiraterone and prednisone started around 02/01/2020.  Degarelix started on 01/29/2020. -Lupron 45 mg on 02/26/2020.   2.  Family history: -Father died of prostate cancer at age 52.  Sister had cancer on her leg resected.  Paternal uncle had cancer, patient does not know the type.   PLAN:  1.  Metastatic  castration sensitive prostate cancer: - He is tolerating Zytiga very well. - Last Lupron injection on 03/15/2021. - We have reviewed labs from 07/15/2021 which showed normal LFTs.  Mild macrocytic anemia stable. - His PSA is stable at 0.07.  No indication for changing treatment. - Continue Abiraterone 1000 mg daily along with prednisone 5 mg daily. - He reports some back pain radiating to the left leg.  He will follow-up with Dr. Aline Brochure. - I have given a temporary one-time prescription of tramadol 50 mg every 8 hours as needed.  He reports that he tried NSAIDs which did not help. - He also had pain in the perineum.  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  It is not fluctuant. - We will start him on Keflex 5 mg 3 times daily for 7 days.   2.  Family history: - Germline mutation testing is negative.   3.  Bilateral hip pains: - He has joint pains involving multiple joints which is stable.  Previous MRI showed negative for metastatic disease.   4.  Constipation: - Continue stool softener and milk of magnesia.  5.  Osteopenia: - He does not have any bone metastasis. - We reviewed his DEXA scan from 07/15/2021 which showed T score -1.7 consistent with osteopenia. - We talked about starting him on Prolia every 6 months to prevent Lupron associated bone loss. - We talked about side effects including hypocalcemia and rare chance of osteonecrosis of the jaw.  He does not have any teeth and wears plates. - He gives [place] permission to proceed with Prolia.  We will get authorization from his insurance and likely start in early January.   Orders placed this encounter:  No orders of the defined types were placed in this encounter.    Derek Jack, MD [place] [phone]   I, Thana Ates, am acting as a scribe for Dr. Derek Jack.  I, Derek Jack MD, have reviewed the above documentation for accuracy and completeness, and I agree with the  above.

## 2021-07-22 ENCOUNTER — Other Ambulatory Visit: Payer: Self-pay

## 2021-07-22 ENCOUNTER — Other Ambulatory Visit (HOSPITAL_COMMUNITY): Payer: Self-pay | Admitting: *Deleted

## 2021-07-22 ENCOUNTER — Inpatient Hospital Stay (HOSPITAL_BASED_OUTPATIENT_CLINIC_OR_DEPARTMENT_OTHER): Payer: 59 | Admitting: Hematology

## 2021-07-22 ENCOUNTER — Encounter (HOSPITAL_COMMUNITY): Payer: Self-pay | Admitting: Hematology

## 2021-07-22 VITALS — BP 128/91 | HR 95 | Temp 98.1°F | Resp 19 | Ht 69.0 in | Wt 237.4 lb

## 2021-07-22 DIAGNOSIS — C772 Secondary and unspecified malignant neoplasm of intra-abdominal lymph nodes: Secondary | ICD-10-CM | POA: Diagnosis not present

## 2021-07-22 DIAGNOSIS — K5900 Constipation, unspecified: Secondary | ICD-10-CM | POA: Diagnosis not present

## 2021-07-22 DIAGNOSIS — M25551 Pain in right hip: Secondary | ICD-10-CM | POA: Diagnosis not present

## 2021-07-22 DIAGNOSIS — Z8042 Family history of malignant neoplasm of prostate: Secondary | ICD-10-CM | POA: Diagnosis not present

## 2021-07-22 DIAGNOSIS — M8580 Other specified disorders of bone density and structure, unspecified site: Secondary | ICD-10-CM | POA: Insufficient documentation

## 2021-07-22 DIAGNOSIS — Z801 Family history of malignant neoplasm of trachea, bronchus and lung: Secondary | ICD-10-CM | POA: Diagnosis not present

## 2021-07-22 DIAGNOSIS — D539 Nutritional anemia, unspecified: Secondary | ICD-10-CM | POA: Insufficient documentation

## 2021-07-22 DIAGNOSIS — C61 Malignant neoplasm of prostate: Secondary | ICD-10-CM | POA: Diagnosis present

## 2021-07-22 DIAGNOSIS — M25552 Pain in left hip: Secondary | ICD-10-CM | POA: Diagnosis not present

## 2021-07-22 MED ORDER — TRAMADOL HCL 50 MG PO TABS: 50.0000 mg | ORAL_TABLET | Freq: Three times a day (TID) | ORAL | 0 refills | Status: DC | PRN

## 2021-07-22 MED ORDER — CEPHALEXIN 500 MG PO CAPS: 500.0000 mg | ORAL_CAPSULE | Freq: Three times a day (TID) | ORAL | 0 refills | Status: DC

## 2021-07-22 NOTE — Patient Instructions (Addendum)
[place] at [facility] Discharge Instructions   You were seen and examined today by Dr. Delton Coombes.  He reviewed your lab results and the results from your bone scan.  Your bone scan showed you have osteopenia, which is a weakening of your bones. We will start you on a shot called Prolia.  It is an injection that is given once every 6 months to help strengthen your bones.  You need to take calcium + vitamin D daily since this shot can cause low blood calcium.  Continue Zytiga as prescribed.   We will send a prescription for the boil.  Return as scheduled.     Thank you for choosing [place] at [facility] to provide your oncology and hematology care.  To afford each patient quality time with our provider, please arrive at least 15 minutes before your scheduled appointment time.   If you have a lab appointment with the [facility] please come in thru the Main Entrance and check in at the main information desk.  You need to re-schedule your appointment should you arrive 10 or more minutes late.  We strive to give you quality time with our providers, and arriving late affects you and other patients whose appointments are after yours.  Also, if you no show three or more times for appointments you may be dismissed from the clinic at the providers discretion.     Again, thank you for choosing [facility].  Our hope is that these requests will decrease the amount of time that you wait before being seen by our physicians.       _____________________________________________________________  Should you have questions after your visit to [facility], please contact our office at [phone] and follow the prompts.  Our office hours are 8:00 a.m. and 4:30 p.m. Monday - Friday.  Please note that voicemails left after 4:00 p.m. may not be returned until the following business day.  We are closed weekends and major holidays.   You do have access to a nurse 24-7, just call the main number to the clinic [phone] and do not press any options, hold on the line and a nurse will answer the phone.    For prescription refill requests, have your pharmacy contact our office and allow 72 hours.    Due to Covid, you will need to wear a mask upon entering the hospital. If you do not have a mask, a mask will be given to you at the Main Entrance upon arrival. For doctor visits, patients may have 1 support person age 64 or older with them. For treatment visits, patients can not have anyone with them due to social distancing guidelines and our immunocompromised population.

## 2021-09-15 ENCOUNTER — Ambulatory Visit (HOSPITAL_COMMUNITY): Payer: 59

## 2021-09-20 ENCOUNTER — Inpatient Hospital Stay (HOSPITAL_COMMUNITY): Payer: Medicaid Other | Attending: Hematology

## 2021-09-20 DIAGNOSIS — C772 Secondary and unspecified malignant neoplasm of intra-abdominal lymph nodes: Secondary | ICD-10-CM | POA: Diagnosis not present

## 2021-09-20 DIAGNOSIS — C61 Malignant neoplasm of prostate: Secondary | ICD-10-CM | POA: Insufficient documentation

## 2021-09-20 LAB — CBC WITH DIFFERENTIAL/PLATELET
Abs Immature Granulocytes: 0.05 10*3/uL (ref 0.00–0.07)
Basophils Absolute: 0 10*3/uL (ref 0.0–0.1)
Basophils Relative: 0 %
Eosinophils Absolute: 0.1 10*3/uL (ref 0.0–0.5)
Eosinophils Relative: 1 %
HCT: 41.4 % (ref 39.0–52.0)
Hemoglobin: 13.5 g/dL (ref 13.0–17.0)
Immature Granulocytes: 1 %
Lymphocytes Relative: 21 %
Lymphs Abs: 2.2 10*3/uL (ref 0.7–4.0)
MCH: 32.2 pg (ref 26.0–34.0)
MCHC: 32.6 g/dL (ref 30.0–36.0)
MCV: 98.8 fL (ref 80.0–100.0)
Monocytes Absolute: 1 10*3/uL (ref 0.1–1.0)
Monocytes Relative: 10 %
Neutro Abs: 6.9 10*3/uL (ref 1.7–7.7)
Neutrophils Relative %: 67 %
Platelets: 269 10*3/uL (ref 150–400)
RBC: 4.19 MIL/uL — ABNORMAL LOW (ref 4.22–5.81)
RDW: 13 % (ref 11.5–15.5)
WBC: 10.2 10*3/uL (ref 4.0–10.5)
nRBC: 0 % (ref 0.0–0.2)

## 2021-09-20 LAB — COMPREHENSIVE METABOLIC PANEL
ALT: 7 U/L (ref 0–44)
AST: 12 U/L — ABNORMAL LOW (ref 15–41)
Albumin: 3.8 g/dL (ref 3.5–5.0)
Alkaline Phosphatase: 92 U/L (ref 38–126)
Anion gap: 8 (ref 5–15)
BUN: 23 mg/dL — ABNORMAL HIGH (ref 6–20)
CO2: 27 mmol/L (ref 22–32)
Calcium: 9.6 mg/dL (ref 8.9–10.3)
Chloride: 102 mmol/L (ref 98–111)
Creatinine, Ser: 0.94 mg/dL (ref 0.61–1.24)
GFR, Estimated: >60 mL/min (ref >60)
Glucose, Bld: 84 mg/dL (ref 70–99)
Potassium: 4.2 mmol/L (ref 3.5–5.1)
Sodium: 137 mmol/L (ref 135–145)
Total Bilirubin: 0.6 mg/dL (ref 0.3–1.2)
Total Protein: 7.5 g/dL (ref 6.5–8.1)

## 2021-09-20 LAB — PSA: Prostatic Specific Antigen: 0.05 ng/mL (ref 0.00–4.00)

## 2021-09-23 ENCOUNTER — Inpatient Hospital Stay (HOSPITAL_BASED_OUTPATIENT_CLINIC_OR_DEPARTMENT_OTHER): Payer: Medicaid Other | Admitting: Hematology

## 2021-09-23 ENCOUNTER — Inpatient Hospital Stay (HOSPITAL_COMMUNITY): Payer: Medicaid Other | Attending: Hematology

## 2021-09-23 ENCOUNTER — Other Ambulatory Visit: Payer: Self-pay

## 2021-09-23 VITALS — BP 94/40 | HR 92 | Temp 97.7°F | Resp 16 | Ht 69.0 in | Wt 238.2 lb

## 2021-09-23 DIAGNOSIS — C772 Secondary and unspecified malignant neoplasm of intra-abdominal lymph nodes: Secondary | ICD-10-CM

## 2021-09-23 DIAGNOSIS — K5900 Constipation, unspecified: Secondary | ICD-10-CM | POA: Insufficient documentation

## 2021-09-23 DIAGNOSIS — C773 Secondary and unspecified malignant neoplasm of axilla and upper limb lymph nodes: Secondary | ICD-10-CM | POA: Diagnosis not present

## 2021-09-23 DIAGNOSIS — R5383 Other fatigue: Secondary | ICD-10-CM | POA: Insufficient documentation

## 2021-09-23 DIAGNOSIS — Z8042 Family history of malignant neoplasm of prostate: Secondary | ICD-10-CM | POA: Insufficient documentation

## 2021-09-23 DIAGNOSIS — C61 Malignant neoplasm of prostate: Secondary | ICD-10-CM | POA: Diagnosis present

## 2021-09-23 DIAGNOSIS — M8580 Other specified disorders of bone density and structure, unspecified site: Secondary | ICD-10-CM | POA: Insufficient documentation

## 2021-09-23 DIAGNOSIS — Z801 Family history of malignant neoplasm of trachea, bronchus and lung: Secondary | ICD-10-CM | POA: Insufficient documentation

## 2021-09-23 DIAGNOSIS — Z806 Family history of leukemia: Secondary | ICD-10-CM | POA: Insufficient documentation

## 2021-09-23 MED ORDER — LEUPROLIDE ACETATE (6 MONTH) 45 MG ~~LOC~~ KIT
45.0000 mg | PACK | Freq: Once | SUBCUTANEOUS | Status: AC
  Administered 2021-09-23: 45 mg via SUBCUTANEOUS
  Filled 2021-09-23: qty 45

## 2021-09-23 MED ORDER — DENOSUMAB 60 MG/ML ~~LOC~~ SOSY
60.0000 mg | PREFILLED_SYRINGE | Freq: Once | SUBCUTANEOUS | Status: AC
  Administered 2021-09-23: 60 mg via SUBCUTANEOUS
  Filled 2021-09-23: qty 1

## 2021-09-23 NOTE — Progress Notes (Signed)
Patient is taking Zytiga as prescribed.  He has not missed any doses and reports no side effects at this time.   

## 2021-09-23 NOTE — Patient Instructions (Addendum)
[place] at [facility] ?Discharge Instructions ? ? ?You were seen and examined today by Dr. Delton Coombes. ? ?He reviewed the results of your lab work which are normal/stable.  ? ?You will receive Eligard and Prolia injections today. ? ?Continue Zytiga as prescribed. ? ?Continue calcium supplements.  ? ?Return as scheduled in 3 months.  ? ? ?Thank you for choosing [place] at [facility] to provide your oncology and hematology care.  To afford each patient quality time with our provider, please arrive at least 15 minutes before your scheduled appointment time.  ? ?If you have a lab appointment with the [facility] please come in thru the Main Entrance and check in at the main information desk. ? ?You need to re-schedule your appointment should you arrive 10 or more minutes late.  We strive to give you quality time with our providers, and arriving late affects you and other patients whose appointments are after yours.  Also, if you no show three or more times for appointments you may be dismissed from the clinic at the providers discretion.     ?Again, thank you for choosing [facility].  Our hope is that these requests will decrease the amount of time that you wait before being seen by our physicians.       ?_____________________________________________________________ ? ?Should you have questions after your visit to [facility], please contact our office at [phone] and follow the prompts.  Our office hours are 8:00 a.m. and 4:30 p.m. Monday - Friday.  Please note that voicemails left after 4:00 p.m. may not be returned until the following business day.  We are closed weekends and major holidays.  You do have access to a nurse 24-7, just call the main number to the clinic [phone] and do not press any options, hold on the line and a nurse will answer the phone.   ? ?For prescription refill requests, have your pharmacy contact our  office and allow 72 hours.   ? ?Due to Covid, you will need to wear a mask upon entering the hospital. If you do not have a mask, a mask will be given to you at the Main Entrance upon arrival. For doctor visits, patients may have 1 support person age 68 or older with them. For treatment visits, patients can not have anyone with them due to social distancing guidelines and our immunocompromised population.  ? ?   ?

## 2021-09-23 NOTE — Progress Notes (Signed)
Patient taking calcium as directed.  Denied tooth, jaw, and leg pain.  No recent or upcoming dental visits.  Labs reviewed.  Patient tolerated injections with no complaints voiced.  See MAR for details.  Patient stable during and after injections.  Site clean and dry with no bruising or swelling noted.  Band aids applied.  Vss with discharge and left in satisfactory condition with no s/s of distress noted.   ?

## 2021-09-23 NOTE — Progress Notes (Signed)
[employer] [place], [place] [address]   CLINIC:  Medical Oncology/Hematology  PCP:  Glenda Chroman, MD [address] 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 The report date is 03/15/2020.   The Common Hereditary Cancers Panel offered by [facility] includes sequencing and/or deletion duplication testing of the following 47 genes: APC, ATM, AXIN2, BARD1, BMPR1A, BRCA1, BRCA2, BRIP1, CDH1, CDK4, CDKN2A (p14ARF), CDKN2A (p16INK4a), CHEK2, CTNNA1, DICER1, EPCAM (Deletion/duplication testing only), GREM1 (promoter region deletion/duplication testing only), KIT, MEN1, MLH1, MSH2, MSH3, MSH6, MUTYH, NBN, NF1, NTHL1, PALB2, PDGFRA, PMS2, POLD1, POLE, PTEN, RAD50, RAD51C, RAD51D, SDHB, SDHC, SDHD, SMAD4, SMARCA4. STK11, TP53, TSC1, TSC2, and VHL.  The following genes were evaluated for sequence changes only: SDHA and HOXB13 c.251G>A variant only. The Prostate Cancer HRR Panel offered by [facility] includes sequencing and/or deletion/duplication analysis of the following 10 genes: ATM, BARD1, BRCA1, BRCA2, BRIP1, CHEK2, FANCL, PALB2, RAD51C, RAD51D.     CANCER STAGING: Cancer Staging  No matching staging information was found for the patient.  INTERVAL HISTORY:  [name], a 61 y.o. 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  Randall Dawson was last seen on 07/22/2021.   Today he reports feeling good. He denies hot flashes, recent infections, CP, ankle swellings, and recent falls. He reports joint pains in his hips. He denies n/v/d. He reports ankle pain.   REVIEW OF SYSTEMS:  Review of Systems  Constitutional:  Negative for appetite change and fatigue.  Cardiovascular:  Negative for leg swelling.  Gastrointestinal:  Positive for constipation. Negative for diarrhea, nausea and vomiting.  Endocrine: Negative for hot flashes.  Musculoskeletal:  Positive for arthralgias (4/10).  Neurological:  Positive for dizziness and headaches.  Psychiatric/Behavioral:  Positive for sleep disturbance. The patient is nervous/anxious.   All other systems reviewed and are negative.  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 [facility])    Past Surgical History:  Procedure Laterality Date   FOREIGN BODY REMOVAL N/A 07/13/2017   Procedure: FOREIGN BODY REMOVAL ADULT;  Surgeon: Helayne Seminole, MD;  Location: [facility];  Service: ENT;  Laterality: N/A;   LEG SURGERY     rods in leg from fracture   RIGID ESOPHAGOSCOPY N/A 07/13/2017   Procedure: RIGID ESOPHAGOSCOPY;  Surgeon: Helayne Seminole, MD;  Location: [facility];  Service: ENT;  Laterality: N/A;    SOCIAL HISTORY:  Social History   Socioeconomic History   Marital status: Single    Spouse name: Not on file   Number of children: 0   Years of education: Not on file   Highest education level: Not on file  Occupational History   Occupation: EMPLOYED  Tobacco Use   Smoking status: Never   Smokeless tobacco: Never  Vaping Use   Vaping Use: Never used  Substance and Sexual Activity   Alcohol use: No   Drug use: No   Sexual activity: Not on file  Other  Topics Concern   Not on file  Social History Narrative   Not on file   Social Determinants of Health   Financial Resource Strain: Medium Risk   Difficulty of Paying Living Expenses: Somewhat hard  Food Insecurity: No Food Insecurity   Worried About Running Out of Food in the Last Year: Never true   Ran Out of Food in the Last Year: Never true  Transportation Needs: No Transportation Needs   Lack of Transportation (Medical): No   Lack of Transportation (Non-Medical): No  Physical Activity: Inactive   Days of Exercise per Week: 0 days   Minutes of Exercise per Session: 0 min  Stress: Stress Concern Present   Feeling of Stress : Rather much  Social Connections: Socially Isolated   Frequency of Communication with Friends and Family: Never   Frequency of Social Gatherings with Friends and Family: Never   Attends Religious Services: Never   Marine scientist or Organizations: No   Attends Music therapist: Never   Marital Status: Divorced  Human resources officer Violence: Not At Risk   Fear of Current or Ex-Partner: No   Emotionally Abused: No   Physically Abused: No   Sexually Abused: No    FAMILY HISTORY:  Family History  Problem Relation Age of Onset   Dementia Mother    Prostate cancer Father 52       metastatic   Cancer Sister        cancer on leg, dx. in her 61s   Lymphoma Brother 34   Lung cancer Paternal Aunt        dx. >50    CURRENT MEDICATIONS:  Current Outpatient Medications  Medication Sig Dispense Refill   abiraterone acetate (ZYTIGA) 250 MG tablet Take 4 tablets (1,000 mg total) by mouth daily. Take on an empty stomach 1 hour before or 2 hours after a meal 120 tablet 11   butalbital-acetaminophen-caffeine (FIORICET) 50-325-40 MG tablet Take 1-2 tablets by mouth every 6 (six) hours as needed for headache. 20 tablet 0   cephALEXin (KEFLEX) 500 MG capsule Take 1 capsule (500 mg total) by mouth 3 (three) times daily. 21 capsule 0   predniSONE  (DELTASONE) 5 MG tablet Take 1 tablet (5 mg total) by mouth 2 (two) times daily with a meal. 60 tablet 11   ondansetron (ZOFRAN ODT) 4 MG disintegrating tablet Take 1 tablet (4 mg total) by mouth every 8 (eight) hours as needed for nausea or vomiting. (Patient not taking: Reported on 09/23/2021) 20 tablet 0   prochlorperazine (COMPAZINE) 10 MG tablet Take 1 tablet (10 mg total) by mouth every 6 (six) hours as needed for nausea or vomiting. (Patient not taking: Reported on 09/23/2021) 60 tablet 2   traMADol (ULTRAM) 50 MG tablet Take 1 tablet (50 mg total) by mouth every 8 (eight) hours as needed. (Patient not taking: Reported on 09/23/2021) 30 tablet 0   No current facility-administered medications for this visit.    ALLERGIES:  No Known Allergies  PHYSICAL EXAM:  Performance status (ECOG): 0 - Asymptomatic  Vitals:   09/23/21 0808  BP: (!) 94/40  Pulse: 92  Resp: 16  Temp: 97.7 F (36.5 C)  SpO2: 97%   Wt Readings  from Last 3 Encounters:  09/23/21 238 lb 3.2 oz (108 kg)  07/22/21 237 lb 7 oz (107.7 kg)  03/15/21 233 lb 12.8 oz (106.1 kg)   Physical Exam Vitals reviewed.  Constitutional:      Appearance: Normal appearance. He is obese.  Cardiovascular:     Rate and Rhythm: Normal rate and regular rhythm.     Pulses: Normal pulses.     Heart sounds: Normal heart sounds.  Pulmonary:     Effort: Pulmonary effort is normal.     Breath sounds: Normal breath sounds.  Neurological:     General: No focal deficit present.     Mental Status: He is alert and oriented to person, place, and time.  Psychiatric:        Mood and Affect: Mood normal.        Behavior: Behavior normal.     LABORATORY DATA:  I have reviewed the labs as listed.  CBC Latest Ref Rng & Units 09/20/2021 07/15/2021 05/10/2021  WBC 4.0 - 10.5 K/uL 10.2 9.1 11.1(H)  Hemoglobin 13.0 - 17.0 g/dL 13.5 12.3(L) 13.0  Hematocrit 39.0 - 52.0 % 41.4 38.1(L) 39.1  Platelets 150 - 400 K/uL 269 290 310   CMP Latest Ref Rng  & Units 09/20/2021 07/15/2021 05/10/2021  Glucose 70 - 99 mg/dL 84 91 106(H)  BUN 6 - 20 mg/dL 23(H) 19 17  Creatinine 0.61 - 1.24 mg/dL 0.94 0.79 0.82  Sodium 135 - 145 mmol/L 137 139 138  Potassium 3.5 - 5.1 mmol/L 4.2 3.5 3.9  Chloride 98 - 111 mmol/L 102 105 105  CO2 22 - 32 mmol/L 27 27 26   Calcium 8.9 - 10.3 mg/dL 9.6 9.1 9.2  Total Protein 6.5 - 8.1 g/dL 7.5 7.3 7.5  Total Bilirubin 0.3 - 1.2 mg/dL 0.6 0.7 0.5  Alkaline Phos 38 - 126 U/L 92 89 87  AST 15 - 41 U/L 12(L) 15 14(L)  ALT 0 - 44 U/L 7 9 10     DIAGNOSTIC IMAGING:  I have independently reviewed the scans and discussed with the patient. No results found.   ASSESSMENT:  1.  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  Serum testosterone on 12/17/2019 of 197. -Bone scan on 12/18/2019 at [facility] [place] shows small focus of uptake seen left inferior orbit favoring benign etiology.  No suspicious foci of bone meta stasis. -CTAP with contrast on 12/18/2019 showed retroperitoneal and bilateral iliac chain metastatic adenopathy.  Left para-aortic lymph node measures 15 mm.  Right external iliac lymph node measures 22 x 35 mm.  Left external iliac lymph node measures 26 x 35 mm.  Ill-defined hypoenhancing 12 mm hepatic lesion, indeterminate, however concerning for metastasis.  Sclerotic focus in the right posterior ilium, indeterminate. -MRI of the abdomen on 01/10/2020 shows tiny 8 mm low-attenuation lesion in the posterior right hepatic lobe, too small to characterize.  No other significant abnormalities. -Abiraterone and prednisone started around 02/01/2020.  Degarelix started on 01/29/2020. -Lupron 45 mg on 02/26/2020.   2.  Family history: -Father died of prostate cancer at age 54.  Sister had cancer on her leg resected.  Paternal uncle had cancer, patient does not know the type.   PLAN:  1.  Metastatic  castration sensitive prostate cancer: - He is tolerating Zytiga and prednisone very well. - Blood pressure today is 94/40.  Potassium is 4.2. - LFTs are within normal limits.  CBC  was grossly normal.  PSA was 0.05, down from 0.07 in December 2022. - Continue Abiraterone 1000 mg daily and prednisone 5 mg twice daily until progression. - Recommend Eligard 45 mg injection today. - RTC 3 months for follow-up with repeat labs and PSA.   2.  Family history: - Germline mutation testing is negative.   3.  Bilateral hip pains: - He has bilateral hip pains in the groin region.  Previous MRI was negative for metastatic disease.  Most likely arthritic pains.   4.  Constipation: - Continue stool softener and milk of magnesia.  5.  Osteopenia: - DEXA scan on 07/15/2021 with T score -1.7. - We talked about starting on Prolia every 6 months.  We talked about side effects including hypocalcemia and rare chance of ONJ.  He does not have any teeth and wears dentures. - Reviewed labs today which showed normal calcium.  Continue calcium supplements.  He will proceed with first dose of Prolia today.   Orders placed this encounter:  Orders Placed This Encounter  Procedures   CBC with Differential   Comprehensive metabolic panel   Magnesium   PSA     Derek Jack, MD [place] [phone]   I, Thana Ates, am acting as a scribe for Dr. Derek Jack.  I, Derek Jack MD, have reviewed the above documentation for accuracy and completeness, and I agree with the above.

## 2021-09-23 NOTE — Patient Instructions (Signed)
[facility]  Discharge Instructions: Thank you for choosing [facility] to provide your oncology and hematology care.  If you have a lab appointment with the Cancer Center, please come in thru the Main Entrance and check in at the main information desk.  Wear comfortable clothing and clothing appropriate for easy access to any Portacath or PICC line.   We strive to give you quality time with your provider. You may need to reschedule your appointment if you arrive late (15 or more minutes).  Arriving late affects you and other patients whose appointments are after yours.  Also, if you miss three or more appointments without notifying the office, you may be dismissed from the clinic at the provider's discretion.      For prescription refill requests, have your pharmacy contact our office and allow 72 hours for refills to be completed.        To help prevent nausea and vomiting after your treatment, we encourage you to take your nausea medication as directed.  BELOW ARE SYMPTOMS THAT SHOULD BE REPORTED IMMEDIATELY: *FEVER GREATER THAN 100.4 F (38 C) OR HIGHER *CHILLS OR SWEATING *NAUSEA AND VOMITING THAT IS NOT CONTROLLED WITH YOUR NAUSEA MEDICATION *UNUSUAL SHORTNESS OF BREATH *UNUSUAL BRUISING OR BLEEDING *URINARY PROBLEMS (pain or burning when urinating, or frequent urination) *BOWEL PROBLEMS (unusual diarrhea, constipation, pain near the anus) TENDERNESS IN MOUTH AND THROAT WITH OR WITHOUT PRESENCE OF ULCERS (sore throat, sores in mouth, or a toothache) UNUSUAL RASH, SWELLING OR PAIN  UNUSUAL VAGINAL DISCHARGE OR ITCHING   Items with * indicate a potential emergency and should be followed up as soon as possible or go to the Emergency Department if any problems should occur.  Please show the CHEMOTHERAPY ALERT CARD or IMMUNOTHERAPY ALERT CARD at check-in to the Emergency Department and triage nurse.  Should you have questions after your visit or need to cancel  or reschedule your appointment, please contact [facility] [phone]  and follow the prompts.  Office hours are 8:00 a.m. to 4:30 p.m. Monday - Friday. Please note that voicemails left after 4:00 p.m. may not be returned until the following business day.  We are closed weekends and major holidays. You have access to a nurse at all times for urgent questions. Please call the main number to the clinic [phone] and follow the prompts.  For any non-urgent questions, you may also contact your provider using MyChart. We now offer e-Visits for anyone 18 and older to request care online for non-urgent symptoms. For details visit [email or web address].   Also download the MyChart app! Go to the app store, search "MyChart", open the app, select [facility], and log in with your MyChart username and password.  Due to Covid, a mask is required upon entering the hospital/clinic. If you do not have a mask, one will be given to you upon arrival. For doctor visits, patients may have 1 support person aged 18 or older with them. For treatment visits, patients cannot have anyone with them due to current Covid guidelines and our immunocompromised population.  

## 2021-12-23 ENCOUNTER — Inpatient Hospital Stay (HOSPITAL_COMMUNITY): Payer: Medicaid Other | Attending: Hematology

## 2021-12-23 DIAGNOSIS — M25551 Pain in right hip: Secondary | ICD-10-CM | POA: Insufficient documentation

## 2021-12-23 DIAGNOSIS — Z8042 Family history of malignant neoplasm of prostate: Secondary | ICD-10-CM | POA: Insufficient documentation

## 2021-12-23 DIAGNOSIS — K5900 Constipation, unspecified: Secondary | ICD-10-CM | POA: Insufficient documentation

## 2021-12-23 DIAGNOSIS — Z807 Family history of other malignant neoplasms of lymphoid, hematopoietic and related tissues: Secondary | ICD-10-CM | POA: Insufficient documentation

## 2021-12-23 DIAGNOSIS — C61 Malignant neoplasm of prostate: Secondary | ICD-10-CM | POA: Diagnosis present

## 2021-12-23 DIAGNOSIS — M25552 Pain in left hip: Secondary | ICD-10-CM | POA: Insufficient documentation

## 2021-12-23 DIAGNOSIS — Z801 Family history of malignant neoplasm of trachea, bronchus and lung: Secondary | ICD-10-CM | POA: Insufficient documentation

## 2021-12-23 DIAGNOSIS — M8580 Other specified disorders of bone density and structure, unspecified site: Secondary | ICD-10-CM | POA: Insufficient documentation

## 2021-12-23 DIAGNOSIS — C772 Secondary and unspecified malignant neoplasm of intra-abdominal lymph nodes: Secondary | ICD-10-CM | POA: Insufficient documentation

## 2021-12-23 LAB — COMPREHENSIVE METABOLIC PANEL
ALT: 8 U/L (ref 0–44)
AST: 12 U/L — ABNORMAL LOW (ref 15–41)
Albumin: 3.7 g/dL (ref 3.5–5.0)
Alkaline Phosphatase: 77 U/L (ref 38–126)
Anion gap: 3 — ABNORMAL LOW (ref 5–15)
BUN: 19 mg/dL (ref 6–20)
CO2: 27 mmol/L (ref 22–32)
Calcium: 8.6 mg/dL — ABNORMAL LOW (ref 8.9–10.3)
Chloride: 108 mmol/L (ref 98–111)
Creatinine, Ser: 0.77 mg/dL (ref 0.61–1.24)
GFR, Estimated: >60 mL/min (ref >60)
Glucose, Bld: 90 mg/dL (ref 70–99)
Potassium: 3.9 mmol/L (ref 3.5–5.1)
Sodium: 138 mmol/L (ref 135–145)
Total Bilirubin: 0.7 mg/dL (ref 0.3–1.2)
Total Protein: 7.1 g/dL (ref 6.5–8.1)

## 2021-12-23 LAB — CBC WITH DIFFERENTIAL/PLATELET
Abs Immature Granulocytes: 0.08 10*3/uL — ABNORMAL HIGH (ref 0.00–0.07)
Basophils Absolute: 0.1 10*3/uL (ref 0.0–0.1)
Basophils Relative: 1 %
Eosinophils Absolute: 0.1 10*3/uL (ref 0.0–0.5)
Eosinophils Relative: 1 %
HCT: 39.9 % (ref 39.0–52.0)
Hemoglobin: 13 g/dL (ref 13.0–17.0)
Immature Granulocytes: 1 %
Lymphocytes Relative: 26 %
Lymphs Abs: 2.4 10*3/uL (ref 0.7–4.0)
MCH: 31.9 pg (ref 26.0–34.0)
MCHC: 32.6 g/dL (ref 30.0–36.0)
MCV: 97.8 fL (ref 80.0–100.0)
Monocytes Absolute: 0.8 10*3/uL (ref 0.1–1.0)
Monocytes Relative: 9 %
Neutro Abs: 5.8 10*3/uL (ref 1.7–7.7)
Neutrophils Relative %: 62 %
Platelets: 273 10*3/uL (ref 150–400)
RBC: 4.08 MIL/uL — ABNORMAL LOW (ref 4.22–5.81)
RDW: 13.6 % (ref 11.5–15.5)
WBC: 9.2 10*3/uL (ref 4.0–10.5)
nRBC: 0 % (ref 0.0–0.2)

## 2021-12-23 LAB — PSA: Prostatic Specific Antigen: 0.06 ng/mL (ref 0.00–4.00)

## 2021-12-23 LAB — MAGNESIUM: Magnesium: 2.4 mg/dL (ref 1.7–2.4)

## 2021-12-30 ENCOUNTER — Inpatient Hospital Stay (HOSPITAL_BASED_OUTPATIENT_CLINIC_OR_DEPARTMENT_OTHER): Payer: Medicaid Other | Admitting: Hematology

## 2021-12-30 VITALS — BP 136/93 | HR 61 | Temp 97.2°F | Resp 18 | Ht 69.0 in | Wt 227.7 lb

## 2021-12-30 DIAGNOSIS — C61 Malignant neoplasm of prostate: Secondary | ICD-10-CM | POA: Diagnosis not present

## 2021-12-30 DIAGNOSIS — C772 Secondary and unspecified malignant neoplasm of intra-abdominal lymph nodes: Secondary | ICD-10-CM | POA: Diagnosis not present

## 2021-12-30 NOTE — Progress Notes (Signed)
Patient is taking Zytiga as prescribed.  He has not missed any doses and reports no side effects at this time.   

## 2021-12-30 NOTE — Patient Instructions (Addendum)
[facility] at [facility] Discharge Instructions   You were seen and examined today by Dr. Delton Coombes.  He reviewed the results of your lab work which is normal/stable. Your PSA remains in the normal range and is remaining stable.   You can use over the counter ibuprofen for the pain in your hips.   Continue taking calcium and vitamin D.    Thank you for choosing [facility] at [facility] to provide your oncology and hematology care.  To afford each patient quality time with our provider, please arrive at least 15 minutes before your scheduled appointment time.   If you have a lab appointment with the [facility] please come in thru the Main Entrance and check in at the main information desk.  You need to re-schedule your appointment should you arrive 10 or more minutes late.  We strive to give you quality time with our providers, and arriving late affects you and other patients whose appointments are after yours.  Also, if you no show three or more times for appointments you may be dismissed from the clinic at the providers discretion.     Again, thank you for choosing [facility].  Our hope is that these requests will decrease the amount of time that you wait before being seen by our physicians.       _____________________________________________________________  Should you have questions after your visit to [facility], please contact our office at [phone] and follow the prompts.  Our office hours are 8:00 a.m. and 4:30 p.m. Monday - Friday.  Please note that voicemails left after 4:00 p.m. may not be returned until the following business day.  We are closed weekends and major holidays.  You do have access to a nurse 24-7, just call the main number to the clinic [phone] and do not press any options, hold on the line and a nurse will answer the phone.    For prescription refill requests, have your pharmacy  contact our office and allow 72 hours.    Due to Covid, you will need to wear a mask upon entering the hospital. If you do not have a mask, a mask will be given to you at the Main Entrance upon arrival. For doctor visits, patients may have 1 support person age 46 or older with them. For treatment visits, patients can not have anyone with them due to social distancing guidelines and our immunocompromised population.

## 2021-12-30 NOTE — Progress Notes (Signed)
[facility] [place], [place] [address]   CLINIC:  Medical Oncology/Hematology  PCP:  Glenda Chroman, MD [address] 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 The report date is 03/15/2020.   The Common Hereditary Cancers Panel offered by [facility] includes sequencing and/or deletion duplication testing of the following 47 genes: APC, ATM, AXIN2, BARD1, BMPR1A, BRCA1, BRCA2, BRIP1, CDH1, CDK4, CDKN2A (p14ARF), CDKN2A (p16INK4a), CHEK2, CTNNA1, DICER1, EPCAM (Deletion/duplication testing only), GREM1 (promoter region deletion/duplication testing only), KIT, MEN1, MLH1, MSH2, MSH3, MSH6, MUTYH, NBN, NF1, NTHL1, PALB2, PDGFRA, PMS2, POLD1, POLE, PTEN, RAD50, RAD51C, RAD51D, SDHB, SDHC, SDHD, SMAD4, SMARCA4. STK11, TP53, TSC1, TSC2, and VHL.  The following genes were evaluated for sequence changes only: SDHA and HOXB13 c.251G>A variant only. The Prostate Cancer HRR Panel offered by [facility] includes sequencing and/or deletion/duplication analysis of the following 10 genes: ATM, BARD1, BRCA1, BRCA2, BRIP1, CHEK2, FANCL, PALB2, RAD51C, RAD51D.     CANCER STAGING: Cancer Staging  No matching staging information was found for the patient.  INTERVAL HISTORY:  Randall Dawson, a 61 y.o. 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  Randall Dawson was last seen on 09/23/2021.   Today he reports feeling good. He reports constant pain in his hips bilaterally which have been present for the past 6 months. He is taking calcium and vitamin D. He reports hot flashes. He denies fevers and infections.   REVIEW OF SYSTEMS:  Review of Systems  Constitutional:  Negative for appetite change, fatigue and fever.  Gastrointestinal:  Positive for constipation.  Endocrine: Positive for hot flashes.  Genitourinary:  Positive for frequency.   Musculoskeletal:  Positive for arthralgias (5/10 hips) and back pain (5/10).  Neurological:  Positive for dizziness.  Psychiatric/Behavioral:  Positive for depression and sleep disturbance.   All other systems reviewed and are negative.   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 [facility])    Past Surgical History:  Procedure Laterality Date   FOREIGN BODY REMOVAL N/A 07/13/2017   Procedure: FOREIGN BODY REMOVAL ADULT;  Surgeon: Helayne Seminole, MD;  Location: [facility];  Service: ENT;  Laterality: N/A;   LEG SURGERY     rods in leg from fracture   RIGID ESOPHAGOSCOPY N/A 07/13/2017   Procedure: RIGID ESOPHAGOSCOPY;  Surgeon: Helayne Seminole, MD;  Location: [facility] OR;  Service: ENT;  Laterality: N/A;    SOCIAL HISTORY:  Social History   Socioeconomic History   Marital status: Single    Spouse name: Not on file   Number of children: 0   Years of education: Not on file   Highest education level: Not on file  Occupational History  Occupation: EMPLOYED  Tobacco Use   Smoking status: Never   Smokeless tobacco: Never  Vaping Use   Vaping Use: Never used  Substance and Sexual Activity   Alcohol use: No   Drug use: No   Sexual activity: Not on file  Other Topics Concern    Not on file  Social History Narrative   Not on file   Social Determinants of Health   Financial Resource Strain: Medium Risk (03/15/2021)   Overall Financial Resource Strain (CARDIA)    Difficulty of Paying Living Expenses: Somewhat hard  Food Insecurity: No Food Insecurity (03/15/2021)   Hunger Vital Sign    Worried About Running Out of Food in the Last Year: Never true    Ran Out of Food in the Last Year: Never true  Transportation Needs: No Transportation Needs (03/15/2021)   PRAPARE - Hydrologist (Medical): No    Lack of Transportation (Non-Medical): No  Physical Activity: Inactive (03/15/2021)   Exercise Vital Sign    Days of Exercise per Week: 0 days    Minutes of Exercise per Session: 0 min  Stress: Stress Concern Present (03/15/2021)   [place]    Feeling of Stress : Rather much  Social Connections: Socially Isolated (03/15/2021)   Social Connection and Isolation Panel [NHANES]    Frequency of Communication with Friends and Family: Never    Frequency of Social Gatherings with Friends and Family: Never    Attends Religious Services: Never    Marine scientist or Organizations: No    Attends Archivist Meetings: Never    Marital Status: Divorced  Human resources officer Violence: Not At Risk (03/15/2021)   Humiliation, Afraid, Rape, and Kick questionnaire    Fear of Current or Ex-Partner: No    Emotionally Abused: No    Physically Abused: No    Sexually Abused: No    FAMILY HISTORY:  Family History  Problem Relation Age of Onset   Dementia Mother    Prostate cancer Father 19       metastatic   Cancer Sister        cancer on leg, dx. in her 66s   Lymphoma Brother 30   Lung cancer Paternal Aunt        dx. >50    CURRENT MEDICATIONS:  Current Outpatient Medications  Medication Sig Dispense Refill   abiraterone acetate (ZYTIGA) 250 MG tablet Take 4 tablets  (1,000 mg total) by mouth daily. Take on an empty stomach 1 hour before or 2 hours after a meal 120 tablet 11   butalbital-acetaminophen-caffeine (FIORICET) 50-325-40 MG tablet Take 1-2 tablets by mouth every 6 (six) hours as needed for headache. 20 tablet 0   cephALEXin (KEFLEX) 500 MG capsule Take 1 capsule (500 mg total) by mouth 3 (three) times daily. 21 capsule 0   ondansetron (ZOFRAN ODT) 4 MG disintegrating tablet Take 1 tablet (4 mg total) by mouth every 8 (eight) hours as needed for nausea or vomiting. 20 tablet 0   predniSONE (DELTASONE) 5 MG tablet Take 1 tablet (5 mg total) by mouth 2 (two) times daily with a meal. 60 tablet 11   prochlorperazine (COMPAZINE) 10 MG tablet Take 1 tablet (10 mg total) by mouth every 6 (six) hours as needed for nausea or vomiting. 60 tablet 2   traMADol (ULTRAM) 50 MG tablet Take 1 tablet (50 mg total) by mouth every 8 (eight) hours as needed.  30 tablet 0   No current facility-administered medications for this visit.    ALLERGIES:  No Known Allergies  PHYSICAL EXAM:  Performance status (ECOG): 0 - Asymptomatic  Vitals:   12/30/21 0800  BP: (!) 136/93  Pulse: 61  Resp: 18  Temp: (!) 97.2 F (36.2 C)  SpO2: 98%   Wt Readings from Last 3 Encounters:  12/30/21 227 lb 11.8 oz (103.3 kg)  09/23/21 238 lb 3.2 oz (108 kg)  07/22/21 237 lb 7 oz (107.7 kg)   Physical Exam Vitals reviewed.  Constitutional:      Appearance: Normal appearance. He is obese.  Cardiovascular:     Rate and Rhythm: Normal rate and regular rhythm.     Pulses: Normal pulses.     Heart sounds: Normal heart sounds.  Pulmonary:     Effort: Pulmonary effort is normal.     Breath sounds: Normal breath sounds.  Musculoskeletal:     Right lower leg: No edema.     Left lower leg: No edema.  Neurological:     General: No focal deficit present.     Mental Status: He is alert and oriented to person, place, and time.  Psychiatric:        Mood and Affect: Mood normal.         Behavior: Behavior normal.      LABORATORY DATA:  I have reviewed the labs as listed.     Latest Ref Rng & Units 12/23/2021    9:48 AM 09/20/2021    8:01 AM 07/15/2021    8:25 AM  CBC  WBC 4.0 - 10.5 K/uL 9.2  10.2  9.1   Hemoglobin 13.0 - 17.0 g/dL 13.0  13.5  12.3   Hematocrit 39.0 - 52.0 % 39.9  41.4  38.1   Platelets 150 - 400 K/uL 273  269  290       Latest Ref Rng & Units 12/23/2021    9:48 AM 09/20/2021    8:01 AM 07/15/2021    8:25 AM  CMP  Glucose 70 - 99 mg/dL 90  84  91   BUN 6 - 20 mg/dL 19  23  19    Creatinine 0.61 - 1.24 mg/dL 0.77  0.94  0.79   Sodium 135 - 145 mmol/L 138  137  139   Potassium 3.5 - 5.1 mmol/L 3.9  4.2  3.5   Chloride 98 - 111 mmol/L 108  102  105   CO2 22 - 32 mmol/L 27  27  27    Calcium 8.9 - 10.3 mg/dL 8.6  9.6  9.1   Total Protein 6.5 - 8.1 g/dL 7.1  7.5  7.3   Total Bilirubin 0.3 - 1.2 mg/dL 0.7  0.6  0.7   Alkaline Phos 38 - 126 U/L 77  92  89   AST 15 - 41 U/L 12  12  15    ALT 0 - 44 U/L 8  7  9      DIAGNOSTIC IMAGING:  I have independently reviewed the scans and discussed with the patient. No results found.   ASSESSMENT:  1.  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  Serum testosterone on 12/17/2019 of 197. -Bone scan on 12/18/2019 at [facility] shows small focus of uptake seen left inferior orbit favoring benign etiology.  No suspicious foci of bone meta stasis. -CTAP with contrast on 12/18/2019 showed retroperitoneal and bilateral  iliac chain metastatic adenopathy.  Left para-aortic lymph node measures 15 mm.  Right external iliac lymph node measures 22 x 35 mm.  Left external iliac lymph node measures 26 x 35 mm.  Ill-defined hypoenhancing 12 mm hepatic lesion, indeterminate, however concerning for metastasis.  Sclerotic focus in the right posterior ilium, indeterminate. -MRI of the abdomen on 01/10/2020 shows  tiny 8 mm low-attenuation lesion in the posterior right hepatic lobe, too small to characterize.  No other significant abnormalities. -Abiraterone and prednisone started around 02/01/2020.  Degarelix started on 01/29/2020. -Lupron 45 mg on 02/26/2020.   2.  Family history: -Father died of prostate cancer at age 65.  Sister had cancer on her leg resected.  Paternal uncle had cancer, patient does not know the type.   PLAN:  1.  Metastatic castration sensitive prostate cancer: - He is tolerating Abiraterone and prednisone well. - Blood pressure today is 136/93.  Potassium is 3.9. - LFTs are normal.  CBC was grossly normal.  PSA was 0.06 and stable.  PSA is staying between 0.05 and 0.07 since last 1 year. - Recommend continuing Abiraterone 1000 mg daily and prednisone 5 mg twice daily. - Last Eligard 45 mg was on 09/23/2021.  RTC 3 months with repeat labs and PSA.   2.  Family history: - Germline mutation testing was negative.   3.  Bilateral hip pains: - He reports that bilateral hip pains in the groin region have slightly gotten worse in the last 6 months. - Previous work-up with MRI was negative for metastatic disease.  Most likely arthritic pains.  I have recommended that he try ibuprofen once or twice daily as needed.   4.  Constipation: - Continue stool softener and milk of magnesia.  5.  Osteopenia: - DEXA scan on 07/15/2021 T score -1.7. - We have initiated him on Prolia in March.  He does not report any dental issues.  He does not have any teeth. - Calcium today is 8.6.  Recommend taking calcium supplements.   Orders placed this encounter:  No orders of the defined types were placed in this encounter.    Derek Jack, MD [place] [phone]   I, Thana Ates, am acting as a scribe for Dr. Derek Jack.  I, Derek Jack MD, have reviewed the above documentation for accuracy and completeness, and I agree with the above.

## 2022-01-19 ENCOUNTER — Other Ambulatory Visit (HOSPITAL_COMMUNITY): Payer: Self-pay | Admitting: Hematology

## 2022-01-19 DIAGNOSIS — C61 Malignant neoplasm of prostate: Secondary | ICD-10-CM

## 2022-02-08 ENCOUNTER — Other Ambulatory Visit (HOSPITAL_COMMUNITY): Payer: Self-pay | Admitting: *Deleted

## 2022-02-08 DIAGNOSIS — C61 Malignant neoplasm of prostate: Secondary | ICD-10-CM

## 2022-02-08 MED ORDER — ABIRATERONE ACETATE 250 MG PO TABS: 1000.0000 mg | ORAL_TABLET | Freq: Every day | ORAL | 11 refills | Status: DC

## 2022-02-08 NOTE — Telephone Encounter (Signed)
Chart reviewed. Zytiga refilled per last office visit with Dr. Delton Coombes.

## 2022-03-29 ENCOUNTER — Inpatient Hospital Stay: Payer: Medicaid Other | Attending: Hematology

## 2022-03-29 ENCOUNTER — Inpatient Hospital Stay: Payer: Medicaid Other

## 2022-03-29 VITALS — BP 119/78 | HR 75 | Temp 97.3°F | Resp 18 | Wt 228.8 lb

## 2022-03-29 DIAGNOSIS — Z806 Family history of leukemia: Secondary | ICD-10-CM | POA: Diagnosis not present

## 2022-03-29 DIAGNOSIS — K5900 Constipation, unspecified: Secondary | ICD-10-CM | POA: Insufficient documentation

## 2022-03-29 DIAGNOSIS — M8580 Other specified disorders of bone density and structure, unspecified site: Secondary | ICD-10-CM | POA: Diagnosis not present

## 2022-03-29 DIAGNOSIS — C61 Malignant neoplasm of prostate: Secondary | ICD-10-CM | POA: Diagnosis present

## 2022-03-29 DIAGNOSIS — Z7952 Long term (current) use of systemic steroids: Secondary | ICD-10-CM | POA: Insufficient documentation

## 2022-03-29 DIAGNOSIS — R232 Flushing: Secondary | ICD-10-CM | POA: Diagnosis not present

## 2022-03-29 DIAGNOSIS — Z191 Hormone sensitive malignancy status: Secondary | ICD-10-CM | POA: Insufficient documentation

## 2022-03-29 DIAGNOSIS — M25551 Pain in right hip: Secondary | ICD-10-CM | POA: Diagnosis not present

## 2022-03-29 DIAGNOSIS — Z8042 Family history of malignant neoplasm of prostate: Secondary | ICD-10-CM | POA: Diagnosis not present

## 2022-03-29 DIAGNOSIS — M25552 Pain in left hip: Secondary | ICD-10-CM | POA: Diagnosis not present

## 2022-03-29 DIAGNOSIS — Z801 Family history of malignant neoplasm of trachea, bronchus and lung: Secondary | ICD-10-CM | POA: Diagnosis not present

## 2022-03-29 DIAGNOSIS — C772 Secondary and unspecified malignant neoplasm of intra-abdominal lymph nodes: Secondary | ICD-10-CM

## 2022-03-29 DIAGNOSIS — Z809 Family history of malignant neoplasm, unspecified: Secondary | ICD-10-CM | POA: Diagnosis not present

## 2022-03-29 LAB — CBC WITH DIFFERENTIAL/PLATELET
Abs Immature Granulocytes: 0.05 10*3/uL (ref 0.00–0.07)
Basophils Absolute: 0 10*3/uL (ref 0.0–0.1)
Basophils Relative: 0 %
Eosinophils Absolute: 0.1 10*3/uL (ref 0.0–0.5)
Eosinophils Relative: 2 %
HCT: 37.6 % — ABNORMAL LOW (ref 39.0–52.0)
Hemoglobin: 12.4 g/dL — ABNORMAL LOW (ref 13.0–17.0)
Immature Granulocytes: 1 %
Lymphocytes Relative: 16 %
Lymphs Abs: 1.5 10*3/uL (ref 0.7–4.0)
MCH: 32.6 pg (ref 26.0–34.0)
MCHC: 33 g/dL (ref 30.0–36.0)
MCV: 98.9 fL (ref 80.0–100.0)
Monocytes Absolute: 0.9 10*3/uL (ref 0.1–1.0)
Monocytes Relative: 10 %
Neutro Abs: 6.7 10*3/uL (ref 1.7–7.7)
Neutrophils Relative %: 71 %
Platelets: 266 10*3/uL (ref 150–400)
RBC: 3.8 MIL/uL — ABNORMAL LOW (ref 4.22–5.81)
RDW: 13.2 % (ref 11.5–15.5)
WBC: 9.4 10*3/uL (ref 4.0–10.5)
nRBC: 0 % (ref 0.0–0.2)

## 2022-03-29 LAB — COMPREHENSIVE METABOLIC PANEL
ALT: 8 U/L (ref 0–44)
AST: 14 U/L — ABNORMAL LOW (ref 15–41)
Albumin: 3.6 g/dL (ref 3.5–5.0)
Alkaline Phosphatase: 82 U/L (ref 38–126)
Anion gap: 6 (ref 5–15)
BUN: 17 mg/dL (ref 8–23)
CO2: 27 mmol/L (ref 22–32)
Calcium: 9.2 mg/dL (ref 8.9–10.3)
Chloride: 108 mmol/L (ref 98–111)
Creatinine, Ser: 0.79 mg/dL (ref 0.61–1.24)
GFR, Estimated: >60 mL/min (ref >60)
Glucose, Bld: 89 mg/dL (ref 70–99)
Potassium: 4 mmol/L (ref 3.5–5.1)
Sodium: 141 mmol/L (ref 135–145)
Total Bilirubin: 1 mg/dL (ref 0.3–1.2)
Total Protein: 7 g/dL (ref 6.5–8.1)

## 2022-03-29 LAB — PSA: Prostatic Specific Antigen: 0.04 ng/mL (ref 0.00–4.00)

## 2022-03-29 LAB — MAGNESIUM: Magnesium: 2.1 mg/dL (ref 1.7–2.4)

## 2022-03-29 MED ORDER — LEUPROLIDE ACETATE (6 MONTH) 45 MG ~~LOC~~ KIT
45.0000 mg | PACK | Freq: Once | SUBCUTANEOUS | Status: AC
  Administered 2022-03-29: 45 mg via SUBCUTANEOUS
  Filled 2022-03-29: qty 45

## 2022-03-29 MED ORDER — DENOSUMAB 60 MG/ML ~~LOC~~ SOSY
60.0000 mg | PREFILLED_SYRINGE | Freq: Once | SUBCUTANEOUS | Status: AC
  Administered 2022-03-29: 60 mg via SUBCUTANEOUS
  Filled 2022-03-29: qty 1

## 2022-03-29 NOTE — Progress Notes (Signed)
Patient tolerated injection with no complaints voiced.  Site clean and dry with no bruising or swelling noted at site.  See MAR for details.  Band aid applied.  Patient stable during and after injection.  Vss with discharge and left in satisfactory condition with no s/s of distress noted.  

## 2022-03-29 NOTE — Patient Instructions (Signed)
[facility]  Discharge Instructions: Thank you for choosing [facility] to provide your oncology and hematology care.  If you have a lab appointment with the [facility], please come in thru the Main Entrance and check in at the main information desk.  Wear comfortable clothing and clothing appropriate for easy access to any Portacath or PICC line.   We strive to give you quality time with your provider. You may need to reschedule your appointment if you arrive late (15 or more minutes).  Arriving late affects you and other patients whose appointments are after yours.  Also, if you miss three or more appointments without notifying the office, you may be dismissed from the clinic at the provider's discretion.      For prescription refill requests, have your pharmacy contact our office and allow 72 hours for refills to be completed.      Denosumab Injection (Osteoporosis) What is this medication? DENOSUMAB (den oh SUE mab) prevents and treats osteoporosis. It works by [employer] stronger and less likely to break (fracture). It is a monoclonal antibody. This medicine may be used for other purposes; ask your health care provider or pharmacist if you have questions. COMMON BRAND NAME(S): Prolia What should I tell my care team before I take this medication? They need to know if you have any of these conditions: Dental or gum disease, or plan to have dental surgery or a tooth pulled Infection Kidney disease Low levels of calcium or vitamin D in your blood On dialysis Poor nutrition Skin conditions Thyroid disease, or have had thyroid or parathyroid surgery Trouble absorbing minerals in your stomach or intestine An unusual reaction to denosumab, other medications, foods, dyes, or preservatives Pregnant or trying to get pregnant Breast-feeding How should I use this medication? This medication is injected under the skin. It is given by your care team  in a hospital or clinic setting. A special MedGuide will be given to you before each treatment. Be sure to read this information carefully each time. Talk to your care team about the use of this medication in children. Special care may be needed. Overdosage: If you think you have taken too much of this medicine contact a poison control center or emergency room at once. NOTE: This medicine is only for you. Do not share this medicine with others. What if I miss a dose? Keep appointments for follow-up doses. It is important not to miss your dose. Call your care team if you are unable to keep an appointment. What may interact with this medication? Do not take this medication with any of the following: Other medications that contain denosumab This medication may also interact with the following: Medications that lower your chance of fighting infection Steroid medications, such as prednisone or cortisone This list may not describe all possible interactions. Give your health care provider a list of all the medicines, herbs, non-prescription drugs, or dietary supplements you use. Also tell them if you smoke, drink alcohol, or use illegal drugs. Some items may interact with your medicine. What should I watch for while using this medication? Your condition will be monitored carefully while you are receiving this medication. You may need blood work while taking this medication. This medication may increase your risk of getting an infection. Call your care team for advice if you get a fever, chills, sore throat, or other symptoms of a cold or flu. Do not treat yourself. Try to avoid being around people  who are sick. Tell your dentist and dental surgeon that you are taking this medication. You should not have major dental surgery while on this medication. See your dentist to have a dental exam and fix any dental problems before starting this medication. Take good care of your teeth while on this medication. Make  sure you see your dentist for regular follow-up appointments. You should make sure you get enough calcium and vitamin D while you are taking this medication. Discuss the foods you eat and the vitamins you take with your care team. Talk to your care team if you are pregnant or think you might be pregnant. This medication can cause serious birth defects if taken during pregnancy and for 5 months after the last dose. You will need a negative pregnancy test before starting this medication. Contraception is recommended while taking this medication and for 5 months after the last dose. Your care team can help you find the option that works for you. Talk to your care team before breastfeeding. Changes to your treatment plan may be needed. What side effects may I notice from receiving this medication? Side effects that you should report to your care team as soon as possible: Allergic reactions--skin rash, itching, hives, swelling of the face, lips, tongue, or throat Infection--fever, chills, cough, sore throat, wounds that don't heal, pain or trouble when passing urine, general feeling of discomfort or being unwell Low calcium level--muscle pain or cramps, confusion, tingling, or numbness in the hands or feet Osteonecrosis of the jaw--pain, swelling, or redness in the mouth, numbness of the jaw, poor healing after dental work, unusual discharge from the mouth, visible bones in the mouth Severe bone, joint, or muscle pain Skin infection--skin redness, swelling, warmth, or pain Side effects that usually do not require medical attention (report these to your care team if they continue or are bothersome): Back pain Headache Joint pain Muscle pain Pain in the hands, arms, legs, or feet Runny or stuffy nose Sore throat This list may not describe all possible side effects. Call your doctor for medical advice about side effects. You may report side effects to FDA at 1-800-FDA-1088. Where should I keep my  medication? This medication is given in a hospital or clinic. It will not be stored at home. NOTE: This sheet is a summary. It may not cover all possible information. If you have questions about this medicine, talk to your doctor, pharmacist, or health care provider.  2023 Elsevier/Gold Standard (2021-11-22 00:00:00) Leuprolide Suspension for Injection (Prostate Cancer) What is this medication? LEUPROLIDE (loo PROE lide) reduces the symptoms of prostate cancer. It works by decreasing levels of the hormone testosterone in the body. This prevents prostate cancer cells from spreading or growing. This medicine may be used for other purposes; ask your health care provider or pharmacist if you have questions. COMMON BRAND NAME(S): Eligard, Fensolvi, Lupron Depot, Lupron Depot-Ped, Lutrate Depot, Viadur What should I tell my care team before I take this medication? They need to know if you have any of these conditions: Diabetes Heart disease Heart failure High or low levels of electrolytes, such as magnesium, potassium, or sodium in your blood Irregular heartbeat or rhythm Seizures An unusual or allergic reaction to leuprolide, other medications, foods, dyes, or preservatives Pregnant or trying to get pregnant Breast-feeding How should I use this medication? This medication is injected under the skin or into a muscle. It is given by your care team in a hospital or clinic setting. Talk to your care  team about the use of this medication in children. Special care may be needed. Overdosage: If you think you have taken too much of this medicine contact a poison control center or emergency room at once. NOTE: This medicine is only for you. Do not share this medicine with others. What if I miss a dose? Keep appointments for follow-up doses. It is important not to miss your dose. Call your care team if you are unable to keep an appointment. What may interact with this medication? Do not take this  medication with any of the following: Cisapride Dronedarone Ketoconazole Levoketoconazole Pimozide Thioridazine This medication may also interact with the following: Other medications that cause heart rhythm changes This list may not describe all possible interactions. Give your health care provider a list of all the medicines, herbs, non-prescription drugs, or dietary supplements you use. Also tell them if you smoke, drink alcohol, or use illegal drugs. Some items may interact with your medicine. What should I watch for while using this medication? Visit your care team for regular checks on your progress. Tell your care team if your symptoms do not start to get better or if they get worse. This medication may increase blood sugar. The risk may be higher in patients who already have diabetes. Ask your care team what you can do to lower the risk of diabetes while taking this medication. This medication may cause infertility. Talk to your care team if you are concerned about your fertility. Heart attacks and strokes have been reported with the use of this medication. Get emergency help if you develop signs or symptoms of a heart attack or stroke. Talk to your care team about the risks and benefits of this medication. What side effects may I notice from receiving this medication? Side effects that you should report to your care team as soon as possible: Allergic reactions--skin rash, itching, hives, swelling of the face, lips, tongue, or throat Heart attack--pain or tightness in the chest, shoulders, arms, or jaw, nausea, shortness of breath, cold or clammy skin, feeling faint or lightheaded Heart rhythm changes--fast or irregular heartbeat, dizziness, feeling faint or lightheaded, chest pain, trouble breathing High blood sugar (hyperglycemia)--increased thirst or amount of urine, unusual weakness or fatigue, blurry vision Mood swings, irritability, hostility Seizures Stroke--sudden numbness or  weakness of the face, arm, or leg, trouble speaking, confusion, trouble walking, loss of balance or coordination, dizziness, severe headache, change in vision Thoughts of suicide or self-harm, worsening mood, feelings of depression Side effects that usually do not require medical attention (report to your care team if they continue or are bothersome): Bone pain Change in sex drive or performance General discomfort and fatigue Hot flashes Muscle pain Pain, redness, or irritation at injection site Swelling of the ankles, hands, or feet This list may not describe all possible side effects. Call your doctor for medical advice about side effects. You may report side effects to FDA at 1-800-FDA-1088. Where should I keep my medication? This medication is given in a hospital or clinic. It will not be stored at home. NOTE: This sheet is a summary. It may not cover all possible information. If you have questions about this medicine, talk to your doctor, pharmacist, or health care provider.  2023 Elsevier/Gold Standard (2021-09-20 00:00:00)    To help prevent nausea and vomiting after your treatment, we encourage you to take your nausea medication as directed.  BELOW ARE SYMPTOMS THAT SHOULD BE REPORTED IMMEDIATELY: *FEVER GREATER THAN 100.4 F (38 C) OR  HIGHER *CHILLS OR SWEATING *NAUSEA AND VOMITING THAT IS NOT CONTROLLED WITH YOUR NAUSEA MEDICATION *UNUSUAL SHORTNESS OF BREATH *UNUSUAL BRUISING OR BLEEDING *URINARY PROBLEMS (pain or burning when urinating, or frequent urination) *BOWEL PROBLEMS (unusual diarrhea, constipation, pain near the anus) TENDERNESS IN MOUTH AND THROAT WITH OR WITHOUT PRESENCE OF ULCERS (sore throat, sores in mouth, or a toothache) UNUSUAL RASH, SWELLING OR PAIN  UNUSUAL VAGINAL DISCHARGE OR ITCHING   Items with * indicate a potential emergency and should be followed up as soon as possible or go to the Emergency Department if any problems should occur.  Please show  the CHEMOTHERAPY ALERT CARD or IMMUNOTHERAPY ALERT CARD at check-in to the Emergency Department and triage nurse.  Should you have questions after your visit or need to cancel or reschedule your appointment, please contact [facility] [phone]  and follow the prompts.  Office hours are 8:00 a.m. to 4:30 p.m. Monday - Friday. Please note that voicemails left after 4:00 p.m. may not be returned until the following business day.  We are closed weekends and major holidays. You have access to a nurse at all times for urgent questions. Please call the main number to the clinic [phone] and follow the prompts.  For any non-urgent questions, you may also contact your provider using MyChart. We now offer e-Visits for anyone 47 and older to request care online for non-urgent symptoms. For details visit [email or web address].   Also download the MyChart app! Go to the app store, search "MyChart", open the app, select [facility], and log in with your MyChart username and password.  Masks are optional in the cancer centers. If you would like for your care team to wear a mask while they are taking care of you, please let them know. You may have one support person who is at least 61 years old accompany you for your appointments.

## 2022-03-31 ENCOUNTER — Inpatient Hospital Stay (HOSPITAL_BASED_OUTPATIENT_CLINIC_OR_DEPARTMENT_OTHER): Payer: Medicaid Other | Admitting: Hematology

## 2022-03-31 VITALS — BP 128/82 | HR 76 | Temp 96.7°F | Resp 18 | Wt 228.2 lb

## 2022-03-31 DIAGNOSIS — C61 Malignant neoplasm of prostate: Secondary | ICD-10-CM

## 2022-03-31 DIAGNOSIS — C772 Secondary and unspecified malignant neoplasm of intra-abdominal lymph nodes: Secondary | ICD-10-CM | POA: Diagnosis not present

## 2022-03-31 NOTE — Patient Instructions (Signed)
[facility] at [facility] Discharge Instructions   You were seen and examined today by Dr. Katragadda.  He reviewed the results of your lab work which are normal/stable.   Continue Zytiga as prescribed.   Return as scheduled.    Thank you for choosing [facility] at [facility] to provide your oncology and hematology care.  To afford each patient quality time with our provider, please arrive at least 15 minutes before your scheduled appointment time.   If you have a lab appointment with the Cancer Center please come in thru the Main Entrance and check in at the main information desk.  You need to re-schedule your appointment should you arrive 10 or more minutes late.  We strive to give you quality time with our providers, and arriving late affects you and other patients whose appointments are after yours.  Also, if you no show three or more times for appointments you may be dismissed from the clinic at the providers discretion.     Again, thank you for choosing [facility].  Our hope is that these requests will decrease the amount of time that you wait before being seen by our physicians.       _____________________________________________________________  Should you have questions after your visit to [facility], please contact our office at [phone] and follow the prompts.  Our office hours are 8:00 a.m. and 4:30 p.m. Monday - Friday.  Please note that voicemails left after 4:00 p.m. may not be returned until the following business day.  We are closed weekends and major holidays.  You do have access to a nurse 24-7, just call the main number to the clinic [phone] and do not press any options, hold on the line and a nurse will answer the phone.    For prescription refill requests, have your pharmacy contact our office and allow 72 hours.    Due to Covid, you will need to wear a mask upon entering the hospital.  If you do not have a mask, a mask will be given to you at the Main Entrance upon arrival. For doctor visits, patients may have 1 support person age 18 or older with them. For treatment visits, patients can not have anyone with them due to social distancing guidelines and our immunocompromised population.      

## 2022-04-01 ENCOUNTER — Encounter (HOSPITAL_COMMUNITY): Payer: Self-pay | Admitting: Hematology

## 2022-04-01 NOTE — Progress Notes (Signed)
[place], [place] [address]   CLINIC:  Medical Oncology/Hematology  PCP:  Glenda Chroman, MD [address] 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 The report date is 03/15/2020.   The Common Hereditary Cancers Panel offered by [facility] includes sequencing and/or deletion duplication testing of the following 47 genes: APC, ATM, AXIN2, BARD1, BMPR1A, BRCA1, BRCA2, BRIP1, CDH1, CDK4, CDKN2A (p14ARF), CDKN2A (p16INK4a), CHEK2, CTNNA1, DICER1, EPCAM (Deletion/duplication testing only), GREM1 (promoter region deletion/duplication testing only), KIT, MEN1, MLH1, MSH2, MSH3, MSH6, MUTYH, NBN, NF1, NTHL1, PALB2, PDGFRA, PMS2, POLD1, POLE, PTEN, RAD50, RAD51C, RAD51D, SDHB, SDHC, SDHD, SMAD4, SMARCA4. STK11, TP53, TSC1, TSC2, and VHL.  The following genes were evaluated for sequence changes only: SDHA and HOXB13 c.251G>A variant only. The Prostate Cancer HRR Panel offered by [facility] includes sequencing and/or deletion/duplication analysis of the following 10 genes: ATM, BARD1, BRCA1, BRCA2, BRIP1, CHEK2, FANCL, PALB2, RAD51C, RAD51D.     CANCER STAGING:  Cancer Staging  No matching staging information was found for the patient.  INTERVAL HISTORY:  [name], a 61 y.o. 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  He is  tolerating Zytiga with prednisone reasonably well.  He reports some hot flashes.  Hip pains have gotten better and he is not requiring any pain medication.  Last Lupron was on 03/29/2022.  REVIEW OF SYSTEMS:  Review of Systems  Constitutional:  Negative for appetite change, fatigue and fever.  Gastrointestinal:  Positive for constipation.  Endocrine: Positive for hot flashes.  Genitourinary:  Positive for frequency.   Neurological:  Positive for dizziness.  Psychiatric/Behavioral:  Positive for depression and sleep disturbance.   All other systems reviewed and are negative.   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 [facility])    Past Surgical History:  Procedure Laterality Date   FOREIGN BODY REMOVAL N/A 07/13/2017   Procedure: FOREIGN BODY REMOVAL ADULT;  Surgeon: Helayne Seminole, MD;  Location: [place];  Service: ENT;  Laterality: N/A;   LEG SURGERY     rods in leg from fracture   RIGID ESOPHAGOSCOPY N/A 07/13/2017   Procedure: RIGID ESOPHAGOSCOPY;  Surgeon: Helayne Seminole, MD;  Location[facility];  Service: ENT;  Laterality: N/A;    SOCIAL HISTORY:  Social History   Socioeconomic History   Marital status: Single    Spouse name: Not on file   Number of children: 0   Years of education: Not on file   Highest education level: Not on file  Occupational History   Occupation: EMPLOYED  Tobacco Use   Smoking status: Never   Smokeless tobacco: Never  Vaping Use   Vaping Use:  Never used  Substance and Sexual Activity   Alcohol use: No   Drug use: No   Sexual activity: Not on file  Other Topics Concern   Not on file  Social History Narrative   Not on file   Social Determinants of Health   Financial Resource Strain: Medium Risk (03/15/2021)    Overall Financial Resource Strain (CARDIA)    Difficulty of Paying Living Expenses: Somewhat hard  Food Insecurity: No Food Insecurity (03/15/2021)   Hunger Vital Sign    Worried About Running Out of Food in the Last Year: Never true    Ran Out of Food in the Last Year: Never true  Transportation Needs: No Transportation Needs (03/15/2021)   PRAPARE - Hydrologist (Medical): No    Lack of Transportation (Non-Medical): No  Physical Activity: Inactive (03/15/2021)   Exercise Vital Sign    Days of Exercise per Week: 0 days    Minutes of Exercise per Session: 0 min  Stress: Stress Concern Present (03/15/2021)   [place]    Feeling of Stress : Rather much  Social Connections: Socially Isolated (03/15/2021)   Social Connection and Isolation Panel [NHANES]    Frequency of Communication with Friends and Family: Never    Frequency of Social Gatherings with Friends and Family: Never    Attends Religious Services: Never    Marine scientist or Organizations: No    Attends Archivist Meetings: Never    Marital Status: Divorced  Human resources officer Violence: Not At Risk (03/15/2021)   Humiliation, Afraid, Rape, and Kick questionnaire    Fear of Current or Ex-Partner: No    Emotionally Abused: No    Physically Abused: No    Sexually Abused: No    FAMILY HISTORY:  Family History  Problem Relation Age of Onset   Dementia Mother    Prostate cancer Father 39       metastatic   Cancer Sister        cancer on leg, dx. in her 64s   Lymphoma Brother 21   Lung cancer Paternal Aunt        dx. >50    CURRENT MEDICATIONS:  Current Outpatient Medications  Medication Sig Dispense Refill   abiraterone acetate (ZYTIGA) 250 MG tablet Take 4 tablets (1,000 mg total) by mouth daily. Take on an empty stomach 1 hour before or 2 hours after a meal 120 tablet 11   predniSONE (DELTASONE) 5 MG tablet TAKE  1 TABLET(5 MG) BY MOUTH TWICE DAILY WITH A MEAL 60 tablet 11   butalbital-acetaminophen-caffeine (FIORICET) 50-325-40 MG tablet Take 1-2 tablets by mouth every 6 (six) hours as needed for headache. (Patient not taking: Reported on 03/31/2022) 20 tablet 0   ondansetron (ZOFRAN ODT) 4 MG disintegrating tablet Take 1 tablet (4 mg total) by mouth every 8 (eight) hours as needed for nausea or vomiting. (Patient not taking: Reported on 03/31/2022) 20 tablet 0   prochlorperazine (COMPAZINE) 10 MG tablet Take 1 tablet (10 mg total) by mouth every 6 (six) hours as needed for nausea or vomiting. (Patient not taking: Reported on 03/31/2022) 60 tablet 2   traMADol (ULTRAM) 50 MG tablet Take 1 tablet (50 mg total) by mouth every 8 (eight) hours as needed. (Patient not taking: Reported on 03/31/2022) 30 tablet 0   No current facility-administered medications for this visit.    ALLERGIES:  No Known Allergies  PHYSICAL EXAM:  Performance status (ECOG): 0 - Asymptomatic  Vitals:   03/31/22 1107  BP: 128/82  Pulse: 76  Resp: 18  Temp: (!) 96.7 F (35.9 C)  SpO2: 96%   Wt Readings from Last 3 Encounters:  03/31/22 228 lb 3.2 oz (103.5 kg)  03/29/22 228 lb 12.8 oz (103.8 kg)  12/30/21 227 lb 11.8 oz (103.3 kg)   Physical Exam Vitals reviewed.  Constitutional:      Appearance: Normal appearance. He is obese.  Cardiovascular:     Rate and Rhythm: Normal rate and regular rhythm.     Pulses: Normal pulses.     Heart sounds: Normal heart sounds.  Pulmonary:     Effort: Pulmonary effort is normal.     Breath sounds: Normal breath sounds.  Musculoskeletal:     Right lower leg: No edema.     Left lower leg: No edema.  Neurological:     General: No focal deficit present.     Mental Status: He is alert and oriented to person, place, and time.  Psychiatric:        Mood and Affect: Mood normal.        Behavior: Behavior normal.      LABORATORY DATA:  I have reviewed the labs as listed.     Latest Ref  Rng & Units 03/29/2022    9:01 AM 12/23/2021    9:48 AM 09/20/2021    8:01 AM  CBC  WBC 4.0 - 10.5 K/uL 9.4  9.2  10.2   Hemoglobin 13.0 - 17.0 g/dL 12.4  13.0  13.5   Hematocrit 39.0 - 52.0 % 37.6  39.9  41.4   Platelets 150 - 400 K/uL 266  273  269       Latest Ref Rng & Units 03/29/2022    9:01 AM 12/23/2021    9:48 AM 09/20/2021    8:01 AM  CMP  Glucose 70 - 99 mg/dL 89  90  84   BUN 8 - 23 mg/dL 17  19  23    Creatinine 0.61 - 1.24 mg/dL 0.79  0.77  0.94   Sodium 135 - 145 mmol/L 141  138  137   Potassium 3.5 - 5.1 mmol/L 4.0  3.9  4.2   Chloride 98 - 111 mmol/L 108  108  102   CO2 22 - 32 mmol/L 27  27  27    Calcium 8.9 - 10.3 mg/dL 9.2  8.6  9.6   Total Protein 6.5 - 8.1 g/dL 7.0  7.1  7.5   Total Bilirubin 0.3 - 1.2 mg/dL 1.0  0.7  0.6   Alkaline Phos 38 - 126 U/L 82  77  92   AST 15 - 41 U/L 14  12  12    ALT 0 - 44 U/L 8  8  7      DIAGNOSTIC IMAGING:  I have independently reviewed the scans and discussed with the patient. No results found.   ASSESSMENT:  1.  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  Serum testosterone on 12/17/2019 of 197. -B[facility] on 12/18/2019 at [facility] shows small focus of uptake seen left inferior orbit favoring benign etiology.  No suspicious foci of bone meta stasis. -CTAP with contrast on 12/18/2019 showed retroperitoneal and bilateral iliac chain metastatic adenopathy.  Left para-aortic lymph node measures 15 mm.  Right external iliac lymph node measures 22 x 35 mm.  Left  external iliac lymph node measures 26 x 35 mm.  Ill-defined hypoenhancing 12 mm hepatic lesion, indeterminate, however concerning for metastasis.  Sclerotic focus in the right posterior ilium, indeterminate. -MRI of the abdomen on 01/10/2020 shows tiny 8 mm low-attenuation lesion in the posterior right hepatic lobe, too small to characterize.  No other  significant abnormalities. -Abiraterone and prednisone started around 02/01/2020.  Degarelix started on 01/29/2020. -Lupron 45 mg on 02/26/2020.   2.  Family history: -Father died of prostate cancer at age 25.  Sister had cancer on her leg resected.  Paternal uncle had cancer, patient does not know the type.   PLAN:  1.  Metastatic castration sensitive prostate cancer: - He is tolerating abiraterone and prednisone well. - Last Lupron injection was on 03/29/2022.  He has minimal hot flashes which are tolerable. - Blood pressure is 128/72.  Labs show potassium was normal.  LFTs were normal.  CBC was grossly normal with mild normocytic anemia. - PSA has improved to 0.04 from 0.06 previously. - Continue Abiraterone 1000 mg daily with prednisone 5 mg daily.  Recommend RTC 3 months with repeat labs and PSA.   2.  Family history: - Germline mutation testing was negative.   3.  Bilateral hip pains: - He reports that hip pains have gotten better since last visit.  He is not requiring any pain medication.   4.  Constipation: - Continue stool softener and milk of magnesia.  5.  Osteopenia (DEXA scan 07/15/2021 T score -1.7): - We started Prolia in March.  Last dose was on 03/29/2022. - Calcium today is 9.2.  He does not report any joint pains.  Continue calcium supplements.   Orders placed this encounter:  No orders of the defined types were placed in this encounter.    Derek Jack, MD [place] [phone]

## 2022-04-05 ENCOUNTER — Other Ambulatory Visit: Payer: Self-pay | Admitting: *Deleted

## 2022-04-05 DIAGNOSIS — C772 Secondary and unspecified malignant neoplasm of intra-abdominal lymph nodes: Secondary | ICD-10-CM

## 2022-07-06 ENCOUNTER — Inpatient Hospital Stay: Payer: Medicaid Other | Attending: Hematology

## 2022-07-06 DIAGNOSIS — M8580 Other specified disorders of bone density and structure, unspecified site: Secondary | ICD-10-CM | POA: Insufficient documentation

## 2022-07-06 DIAGNOSIS — Z8042 Family history of malignant neoplasm of prostate: Secondary | ICD-10-CM | POA: Insufficient documentation

## 2022-07-06 DIAGNOSIS — Z801 Family history of malignant neoplasm of trachea, bronchus and lung: Secondary | ICD-10-CM | POA: Insufficient documentation

## 2022-07-06 DIAGNOSIS — Z807 Family history of other malignant neoplasms of lymphoid, hematopoietic and related tissues: Secondary | ICD-10-CM | POA: Diagnosis not present

## 2022-07-06 DIAGNOSIS — K5900 Constipation, unspecified: Secondary | ICD-10-CM | POA: Diagnosis not present

## 2022-07-06 DIAGNOSIS — M25551 Pain in right hip: Secondary | ICD-10-CM | POA: Insufficient documentation

## 2022-07-06 DIAGNOSIS — C61 Malignant neoplasm of prostate: Secondary | ICD-10-CM | POA: Insufficient documentation

## 2022-07-06 DIAGNOSIS — M25552 Pain in left hip: Secondary | ICD-10-CM | POA: Insufficient documentation

## 2022-07-06 DIAGNOSIS — C772 Secondary and unspecified malignant neoplasm of intra-abdominal lymph nodes: Secondary | ICD-10-CM | POA: Insufficient documentation

## 2022-07-06 LAB — COMPREHENSIVE METABOLIC PANEL
ALT: 8 U/L (ref 0–44)
AST: 13 U/L — ABNORMAL LOW (ref 15–41)
Albumin: 3.4 g/dL — ABNORMAL LOW (ref 3.5–5.0)
Alkaline Phosphatase: 59 U/L (ref 38–126)
Anion gap: 6 (ref 5–15)
BUN: 15 mg/dL (ref 8–23)
CO2: 28 mmol/L (ref 22–32)
Calcium: 9.1 mg/dL (ref 8.9–10.3)
Chloride: 107 mmol/L (ref 98–111)
Creatinine, Ser: 0.83 mg/dL (ref 0.61–1.24)
GFR, Estimated: >60 mL/min (ref >60)
Glucose, Bld: 106 mg/dL — ABNORMAL HIGH (ref 70–99)
Potassium: 3.7 mmol/L (ref 3.5–5.1)
Sodium: 141 mmol/L (ref 135–145)
Total Bilirubin: 0.6 mg/dL (ref 0.3–1.2)
Total Protein: 6.8 g/dL (ref 6.5–8.1)

## 2022-07-06 LAB — CBC WITH DIFFERENTIAL/PLATELET
Abs Immature Granulocytes: 0.1 10*3/uL — ABNORMAL HIGH (ref 0.00–0.07)
Basophils Absolute: 0.1 10*3/uL (ref 0.0–0.1)
Basophils Relative: 0 %
Eosinophils Absolute: 0.2 10*3/uL (ref 0.0–0.5)
Eosinophils Relative: 2 %
HCT: 38.4 % — ABNORMAL LOW (ref 39.0–52.0)
Hemoglobin: 12.3 g/dL — ABNORMAL LOW (ref 13.0–17.0)
Immature Granulocytes: 1 %
Lymphocytes Relative: 19 %
Lymphs Abs: 2.2 10*3/uL (ref 0.7–4.0)
MCH: 31.4 pg (ref 26.0–34.0)
MCHC: 32 g/dL (ref 30.0–36.0)
MCV: 98 fL (ref 80.0–100.0)
Monocytes Absolute: 1.1 10*3/uL — ABNORMAL HIGH (ref 0.1–1.0)
Monocytes Relative: 10 %
Neutro Abs: 7.8 10*3/uL — ABNORMAL HIGH (ref 1.7–7.7)
Neutrophils Relative %: 68 %
Platelets: 291 10*3/uL (ref 150–400)
RBC: 3.92 MIL/uL — ABNORMAL LOW (ref 4.22–5.81)
RDW: 13 % (ref 11.5–15.5)
WBC: 11.5 10*3/uL — ABNORMAL HIGH (ref 4.0–10.5)
nRBC: 0 % (ref 0.0–0.2)

## 2022-07-06 LAB — PSA: Prostatic Specific Antigen: 0.05 ng/mL (ref 0.00–4.00)

## 2022-07-06 LAB — MAGNESIUM: Magnesium: 2 mg/dL (ref 1.7–2.4)

## 2022-07-13 ENCOUNTER — Inpatient Hospital Stay (HOSPITAL_BASED_OUTPATIENT_CLINIC_OR_DEPARTMENT_OTHER): Payer: Medicaid Other | Admitting: Hematology

## 2022-07-13 ENCOUNTER — Encounter: Payer: Self-pay | Admitting: Hematology

## 2022-07-13 VITALS — BP 137/91 | HR 87 | Temp 97.4°F | Resp 18 | Wt 229.3 lb

## 2022-07-13 DIAGNOSIS — C772 Secondary and unspecified malignant neoplasm of intra-abdominal lymph nodes: Secondary | ICD-10-CM

## 2022-07-13 DIAGNOSIS — C61 Malignant neoplasm of prostate: Secondary | ICD-10-CM

## 2022-07-13 NOTE — Progress Notes (Signed)
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 The report date is 03/15/2020.   The Common Hereditary Cancers Panel offered by [facility] includes sequencing and/or deletion duplication testing of the following 47 genes: APC, ATM, AXIN2, BARD1, BMPR1A, BRCA1, BRCA2, BRIP1, CDH1, CDK4, CDKN2A (p14ARF), CDKN2A (p16INK4a), CHEK2, CTNNA1, DICER1, EPCAM (Deletion/duplication testing only), GREM1 (promoter region deletion/duplication testing only), KIT, MEN1, MLH1, MSH2, MSH3, MSH6, MUTYH, NBN, NF1, NTHL1, PALB2, PDGFRA, PMS2, POLD1, POLE, PTEN, RAD50, RAD51C, RAD51D, SDHB, SDHC, SDHD, SMAD4, SMARCA4. STK11, TP53, TSC1, TSC2, and VHL.  The following genes were evaluated for sequence changes only: SDHA and HOXB13 c.251G>A variant only. The Prostate Cancer HRR Panel offered by [facility] includes sequencing and/or deletion/duplication analysis of the following 10 genes: ATM, BARD1, BRCA1, BRCA2, BRIP1, CHEK2, FANCL, PALB2, RAD51C, RAD51D.     CANCER STAGING:  Cancer Staging  No matching staging information was found for the patient.  INTERVAL HISTORY:  [name], a 61 y.o. male, seen for follow-up of metastatic castration sensitive prostate cancer to the lymph nodes.  He is tolerating Abiraterone and  prednisone very well.  Denies any new onset pains.  Energy levels are 50%.  REVIEW OF SYSTEMS:  Review of Systems  Constitutional:  Negative for appetite change, fatigue and fever.  Endocrine: Positive for hot flashes.  Genitourinary:  Positive for frequency.   All other systems reviewed and are negative.   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 [facility])    Past Surgical History:  Procedure Laterality Date   FOREIGN BODY REMOVAL N/A 07/13/2017   Procedure: FOREIGN BODY REMOVAL ADULT;  Surgeon: Helayne Seminole, MD;  Location: [facility];  Service: ENT;  Laterality: N/A;   LEG SURGERY     rods in leg from fracture   RIGID ESOPHAGOSCOPY N/A 07/13/2017   Procedure: RIGID ESOPHAGOSCOPY;  Surgeon: Helayne Seminole, MD;  Location: [facility];  Service: ENT;  Laterality: N/A;    SOCIAL HISTORY:  Social History   Socioeconomic History   Marital status: Single    Spouse name: Not on file   Number of children: 0   Years of education: Not on file   Highest education level: Not on file  Occupational History   Occupation: EMPLOYED  Tobacco Use   Smoking status: Never   Smokeless tobacco: Never  Vaping Use   Vaping Use: Never used  Substance and Sexual Activity   Alcohol use: No   Drug use: No   Sexual activity: Not on file  Other Topics Concern   Not on file  Social History Narrative  Not on file   Social Determinants of Health   Financial Resource Strain: Medium Risk (03/15/2021)   Overall Financial Resource Strain (CARDIA)    Difficulty of Paying Living Expenses: Somewhat hard  Food Insecurity: No Food Insecurity (03/15/2021)   Hunger Vital Sign    Worried About Running Out of Food in the Last Year: Never true    Ran Out of Food in the Last Year: Never  true  Transportation Needs: No Transportation Needs (03/15/2021)   PRAPARE - Hydrologist (Medical): No    Lack of Transportation (Non-Medical): No  Physical Activity: Inactive (03/15/2021)   Exercise Vital Sign    Days of Exercise per Week: 0 days    Minutes of Exercise per Session: 0 min  Stress: Stress Concern Present (03/15/2021)   [place]    Feeling of Stress : Rather much  Social Connections: Socially Isolated (03/15/2021)   Social Connection and Isolation Panel [NHANES]    Frequency of Communication with Friends and Family: Never    Frequency of Social Gatherings with Friends and Family: Never    Attends Religious Services: Never    Marine scientist or Organizations: No    Attends Archivist Meetings: Never    Marital Status: Divorced  Human resources officer Violence: Not At Risk (03/15/2021)   Humiliation, Afraid, Rape, and Kick questionnaire    Fear of Current or Ex-Partner: No    Emotionally Abused: No    Physically Abused: No    Sexually Abused: No    FAMILY HISTORY:  Family History  Problem Relation Age of Onset   Dementia Mother    Prostate cancer Father 56       metastatic   Cancer Sister        cancer on leg, dx. in her 3s   Lymphoma Brother 88   Lung cancer Paternal Aunt        dx. >50    CURRENT MEDICATIONS:  Current Outpatient Medications  Medication Sig Dispense Refill   abiraterone acetate (ZYTIGA) 250 MG tablet Take 4 tablets (1,000 mg total) by mouth daily. Take on an empty stomach 1 hour before or 2 hours after a meal 120 tablet 11   HYDROcodone-acetaminophen (NORCO/VICODIN) 5-325 MG tablet Take 1 tablet by mouth every 4 (four) hours as needed.     ondansetron (ZOFRAN) 4 MG tablet Take 4 mg by mouth 3 (three) times daily as needed for nausea.     predniSONE (DELTASONE) 5 MG tablet TAKE 1 TABLET(5 MG) BY MOUTH TWICE DAILY WITH A MEAL 60 tablet 11    prochlorperazine (COMPAZINE) 10 MG tablet Take 1 tablet (10 mg total) by mouth every 6 (six) hours as needed for nausea or vomiting. 60 tablet 2   No current facility-administered medications for this visit.    ALLERGIES:  No Known Allergies  PHYSICAL EXAM:  Performance status (ECOG): 0 - Asymptomatic  Vitals:   07/13/22 1158  BP: (!) 137/91  Pulse: 87  Resp: 18  Temp: (!) 97.4 F (36.3 C)  SpO2: 97%   Wt Readings from Last 3 Encounters:  07/13/22 229 lb 4.8 oz (104 kg)  03/31/22 228 lb 3.2 oz (103.5 kg)  03/29/22 228 lb 12.8 oz (103.8 kg)   Physical Exam Vitals reviewed.  Constitutional:      Appearance: Normal appearance. He is obese.  Cardiovascular:     Rate and Rhythm: Normal rate and regular rhythm.  Pulses: Normal pulses.     Heart sounds: Normal heart sounds.  Pulmonary:     Effort: Pulmonary effort is normal.     Breath sounds: Normal breath sounds.  Musculoskeletal:     Right lower leg: No edema.     Left lower leg: No edema.  Neurological:     General: No focal deficit present.     Mental Status: He is alert and oriented to person, place, and time.  Psychiatric:        Mood and Affect: Mood normal.        Behavior: Behavior normal.      LABORATORY DATA:  I have reviewed the labs as listed.     Latest Ref Rng & Units 07/06/2022    9:51 AM 03/29/2022    9:01 AM 12/23/2021    9:48 AM  CBC  WBC 4.0 - 10.5 K/uL 11.5  9.4  9.2   Hemoglobin 13.0 - 17.0 g/dL 12.3  12.4  13.0   Hematocrit 39.0 - 52.0 % 38.4  37.6  39.9   Platelets 150 - 400 K/uL 291  266  273       Latest Ref Rng & Units 07/06/2022    9:51 AM 03/29/2022    9:01 AM 12/23/2021    9:48 AM  CMP  Glucose 70 - 99 mg/dL 106  89  90   BUN 8 - 23 mg/dL _0 Creatinine 0.61 - 1.24 mg/dL 0.83  0.79  0.77   Sodium 135 - 145 mmol/L 141  141  138   Potassium 3.5 - 5.1 mmol/L 3.7  4.0  3.9   Chloride 98 - 111 mmol/L 107  108  108   CO2 22 - 32 mmol/L _1 Calcium 8.9 - 10.3  mg/dL 9.1  9.2  8.6   Total Protein 6.5 - 8.1 g/dL 6.8  7.0  7.1   Total Bilirubin 0.3 - 1.2 mg/dL 0.6  1.0  0.7   Alkaline Phos 38 - 126 U/L 59  82  77   AST 15 - 41 U/L _2 ALT 0 - 44 U/L _3 DIAGNOSTIC IMAGING:  I have independently reviewed the scans and discussed with the patient. No results found.   ASSESSMENT:  1.  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  Serum testosterone on 12/17/2019 of 197. -Bone scan on 12/18/2019 at [facility] shows small focus of uptake seen left inferior orbit favoring benign etiology.  No suspicious foci of bone meta stasis. -CTAP with contrast on 12/18/2019 showed retroperitoneal and bilateral iliac chain metastatic adenopathy.  Left para-aortic lymph node measures 15 mm.  Right external iliac lymph node measures 22 x 35 mm.  Left external iliac lymph node measures 26 x 35 mm.  Ill-defined hypoenhancing 12 mm hepatic lesion, indeterminate, however concerning for metastasis.  Sclerotic focus in the right posterior ilium, indeterminate. -MRI of the abdomen on 01/10/2020 shows tiny 8 mm low-attenuation lesion in the posterior right hepatic lobe, too small to characterize.  No other significant abnormalities. -Abiraterone and prednisone started around 02/01/2020.  Degarelix started on 01/29/2020. -Lupron 45 mg on 02/26/2020.   2.  Family history: -Father died of prostate cancer at age 100.  Sister had cancer on her leg resected.  Paternal uncle had cancer,  patient does not know the type.   PLAN:  1.  Metastatic castration sensitive prostate cancer: - Last Lupron injection on 03/29/2022.  He is tolerating Abiraterone and prednisone well. - Reviewed labs today which showed normal LFTs.  CBC was grossly normal.  PSA stable at 0.05. - Continue Abiraterone 1000 mg daily and prednisone 5 mg daily. - RTC 3 months with repeat PSA  and labs.   2.  Family history: - Germline mutation testing was negative.   3.  Bilateral hip pains: - Bilateral hip pains are stable.   4.  Constipation: - Continue stool softeners and milk of magnesia.  5.  Osteopenia (DEXA scan 07/15/2021 T score -1.7): - Prolia was started in March 2023.  Last dose was on 03/29/2022.  He will receive next dose in March.  He does not report any problems.   Orders placed this encounter:  No orders of the defined types were placed in this encounter.    Derek Jack, MD [place] [phone]

## 2022-07-13 NOTE — Patient Instructions (Addendum)
[facility] Discharge Instructions   You were seen and examined today by Dr. Katragadda.  He reviewed the results of your lab work which are normal/stable.   Continue Zytiga as prescribed.   Return as scheduled.    Thank you for choosing [facility] at [facility] to provide your oncology and hematology care.  To afford each patient quality time with our provider, please arrive at least 15 minutes before your scheduled appointment time.   If you have a lab appointment with the Cancer Center please come in thru the Main Entrance and check in at the main information desk.  You need to re-schedule your appointment should you arrive 10 or more minutes late.  We strive to give you quality time with our providers, and arriving late affects you and other patients whose appointments are after yours.  Also, if you no show three or more times for appointments you may be dismissed from the clinic at the providers discretion.     Again, thank you for choosing [facility].  Our hope is that these requests will decrease the amount of time that you wait before being seen by our physicians.       _____________________________________________________________  Should you have questions after your visit to [facility], please contact our office at [phone] and follow the prompts.  Our office hours are 8:00 a.m. and 4:30 p.m. Monday - Friday.  Please note that voicemails left after 4:00 p.m. may not be returned until the following business day.  We are closed weekends and major holidays.  You do have access to a nurse 24-7, just call the main number to the clinic [phone] and do not press any options, hold on the line and a nurse will answer the phone.    For prescription refill requests, have your pharmacy contact our office and allow 72 hours.    Due to Covid, you will need to wear a mask upon entering the hospital.  If you do not have a mask, a mask will be given to you at the Main Entrance upon arrival. For doctor visits, patients may have 1 support person age 18 or older with them. For treatment visits, patients can not have anyone with them due to social distancing guidelines and our immunocompromised population.      

## 2022-09-14 ENCOUNTER — Other Ambulatory Visit: Payer: Self-pay

## 2022-09-14 ENCOUNTER — Emergency Department (HOSPITAL_COMMUNITY)
Admission: EM | Admit: 2022-09-14 | Discharge: 2022-09-14 | Disposition: A | Discharge status: Home / Self Care | Payer: Medicaid Other | Attending: Emergency Medicine | Admitting: Emergency Medicine

## 2022-09-14 ENCOUNTER — Encounter (HOSPITAL_COMMUNITY): Payer: Self-pay | Admitting: *Deleted

## 2022-09-14 DIAGNOSIS — Z8546 Personal history of malignant neoplasm of prostate: Secondary | ICD-10-CM | POA: Insufficient documentation

## 2022-09-14 DIAGNOSIS — R319 Hematuria, unspecified: Secondary | ICD-10-CM | POA: Diagnosis present

## 2022-09-14 DIAGNOSIS — Z8551 Personal history of malignant neoplasm of bladder: Secondary | ICD-10-CM | POA: Insufficient documentation

## 2022-09-14 DIAGNOSIS — R310 Gross hematuria: Secondary | ICD-10-CM

## 2022-09-14 LAB — URINALYSIS, ROUTINE W REFLEX MICROSCOPIC
Bilirubin Urine: NEGATIVE
Glucose, UA: NEGATIVE mg/dL
Ketones, ur: NEGATIVE mg/dL
Leukocytes,Ua: NEGATIVE
Nitrite: NEGATIVE
Protein, ur: NEGATIVE mg/dL
Specific Gravity, Urine: 1.02 (ref 1.005–1.030)
pH: 7 (ref 5.0–8.0)

## 2022-09-14 LAB — BASIC METABOLIC PANEL
Anion gap: 8 (ref 5–15)
BUN: 21 mg/dL (ref 8–23)
CO2: 28 mmol/L (ref 22–32)
Calcium: 8.9 mg/dL (ref 8.9–10.3)
Chloride: 104 mmol/L (ref 98–111)
Creatinine, Ser: 0.92 mg/dL (ref 0.61–1.24)
GFR, Estimated: >60 mL/min (ref >60)
Glucose, Bld: 93 mg/dL (ref 70–99)
Potassium: 3.6 mmol/L (ref 3.5–5.1)
Sodium: 140 mmol/L (ref 135–145)

## 2022-09-14 LAB — CBC WITH DIFFERENTIAL/PLATELET
Abs Immature Granulocytes: 0.08 10*3/uL — ABNORMAL HIGH (ref 0.00–0.07)
Basophils Absolute: 0 10*3/uL (ref 0.0–0.1)
Basophils Relative: 0 %
Eosinophils Absolute: 0.1 10*3/uL (ref 0.0–0.5)
Eosinophils Relative: 1 %
HCT: 40.6 % (ref 39.0–52.0)
Hemoglobin: 13.2 g/dL (ref 13.0–17.0)
Immature Granulocytes: 1 %
Lymphocytes Relative: 27 %
Lymphs Abs: 2.4 10*3/uL (ref 0.7–4.0)
MCH: 32.2 pg (ref 26.0–34.0)
MCHC: 32.5 g/dL (ref 30.0–36.0)
MCV: 99 fL (ref 80.0–100.0)
Monocytes Absolute: 0.9 10*3/uL (ref 0.1–1.0)
Monocytes Relative: 10 %
Neutro Abs: 5.4 10*3/uL (ref 1.7–7.7)
Neutrophils Relative %: 61 %
Platelets: 289 10*3/uL (ref 150–400)
RBC: 4.1 MIL/uL — ABNORMAL LOW (ref 4.22–5.81)
RDW: 13.4 % (ref 11.5–15.5)
WBC: 8.9 10*3/uL (ref 4.0–10.5)
nRBC: 0 % (ref 0.0–0.2)

## 2022-09-14 LAB — URINALYSIS, MICROSCOPIC (REFLEX): RBC / HPF: >50 RBC/hpf (ref 0–5)

## 2022-09-14 NOTE — ED Triage Notes (Signed)
Pt c/o burning and blood in urine; pt states he has prostate cancer and has recently been diagnosed with a UTI and is being treated but his PCP told him if he had any more problems to come to ED

## 2022-09-14 NOTE — Discharge Instructions (Signed)
Follow-up with either Dr. Delton Coombes or [facility] next week for your hematuria

## 2022-09-14 NOTE — ED Provider Notes (Signed)
[facility] Provider Note   CSN: [id number] Arrival date & time: 09/14/22  [id number]     History  Chief Complaint  Patient presents with   Hematuria    Randall Dawson is a 62 y.o. male.  Patient history of prostate cancer.  Patient states he has had some blood in his urine.  He is presently taking antibiotics for possible UTI.  The history is provided by the patient and medical records. No language interpreter was used.  Hematuria This is a new problem. The current episode started 2 days ago. The problem occurs constantly. The problem has not changed since onset.Pertinent negatives include no chest pain, no abdominal pain and no headaches. Nothing aggravates the symptoms. Nothing relieves the symptoms. He has tried nothing for the symptoms. The treatment provided no relief.       Home Medications Prior to Admission medications   Medication Sig Start Date End Date Taking? Authorizing Provider  abiraterone acetate (ZYTIGA) 250 MG tablet Take 4 tablets (1,000 mg total) by mouth daily. Take on an empty stomach 1 hour before or 2 hours after a meal 02/08/22   Derek Jack, MD  HYDROcodone-acetaminophen (NORCO/VICODIN) 5-325 MG tablet Take 1 tablet by mouth every 4 (four) hours as needed. 06/17/22   [provider]  ondansetron (ZOFRAN) 4 MG tablet Take 4 mg by mouth 3 (three) times daily as needed for nausea. 06/17/22   [provider]  predniSONE (DELTASONE) 5 MG tablet TAKE 1 TABLET(5 MG) BY MOUTH TWICE DAILY WITH A MEAL 01/19/22   Derek Jack, MD  prochlorperazine (COMPAZINE) 10 MG tablet Take 1 tablet (10 mg total) by mouth every 6 (six) hours as needed for nausea or vomiting. Patient not taking: Reported on 09/14/2022 05/25/20   Derek Jack, MD      Allergies    Patient has no known allergies.    Review of Systems   Review of Systems  Constitutional:  Negative for appetite change and  fatigue.  HENT:  Negative for congestion, ear discharge and sinus pressure.   Eyes:  Negative for discharge.  Respiratory:  Negative for cough.   Cardiovascular:  Negative for chest pain.  Gastrointestinal:  Negative for abdominal pain and diarrhea.  Genitourinary:  Positive for hematuria. Negative for frequency.  Musculoskeletal:  Negative for back pain.  Skin:  Negative for rash.  Neurological:  Negative for seizures and headaches.  Psychiatric/Behavioral:  Negative for hallucinations.     Physical Exam Updated Vital Signs BP 111/87   Pulse 81   Temp 97.8 F (36.6 C) (Oral)   Resp 16   Ht '5\' 9"'$  (1.753 m)   Wt 105.7 kg   SpO2 100%   BMI 34.41 kg/m  Physical Exam Vitals and nursing note reviewed.  Constitutional:      Appearance: He is well-developed.  HENT:     Head: Normocephalic.     Nose: Nose normal.  Eyes:     General: No scleral icterus.    Conjunctiva/sclera: Conjunctivae normal.  Neck:     Thyroid: No thyromegaly.  Cardiovascular:     Rate and Rhythm: Normal rate and regular rhythm.     Heart sounds: No murmur heard.    No friction rub. No gallop.  Pulmonary:     Breath sounds: No stridor. No wheezing or rales.  Chest:     Chest wall: No tenderness.  Abdominal:     General: There is no distension.  Tenderness: There is no abdominal tenderness. There is no rebound.  Musculoskeletal:        General: Normal range of motion.     Cervical back: Neck supple.  Lymphadenopathy:     Cervical: No cervical adenopathy.  Skin:    Findings: No erythema or rash.  Neurological:     Mental Status: He is alert and oriented to person, place, and time.     Motor: No abnormal muscle tone.     Coordination: Coordination normal.  Psychiatric:        Behavior: Behavior normal.     ED Results / Procedures / Treatments   Labs (all labs ordered are listed, but only abnormal results are displayed) Labs Reviewed  URINALYSIS, ROUTINE W REFLEX MICROSCOPIC - Abnormal;  Notable for the following components:      Result Value   APPearance CLOUDY (*)    Hgb urine dipstick LARGE (*)    All other components within normal limits  URINALYSIS, MICROSCOPIC (REFLEX) - Abnormal; Notable for the following components:   Bacteria, UA RARE (*)    All other components within normal limits  CBC WITH DIFFERENTIAL/PLATELET - Abnormal; Notable for the following components:   RBC 4.10 (*)    Abs Immature Granulocytes 0.08 (*)    All other components within normal limits  URINE CULTURE  BASIC METABOLIC PANEL    EKG None  Radiology No results found.  Procedures Procedures    Medications Ordered in ED Medications - No data to display  ED Course/ Medical Decision Making/ A&P                             Medical Decision Making Amount and/or Complexity of Data Reviewed Labs: ordered.  This patient presents to the ED for concern of hematuria, this involves an extensive number of treatment options, and is a complaint that carries with it a high risk of complications and morbidity.  The differential diagnosis includes UTI, prostate cancer,  bladder cancer   Co morbidities that complicate the patient evaluation  Bladder cancer   Additional history obtained:  Additional history obtained from patient External records from outside source obtained and reviewed including hospital records   Lab Tests:  I Ordered, and personally interpreted labs.  The pertinent results include: CBC and chemistries unremarkable.  Urinalysis shows blood   Imaging Studies ordered:  No imaging  Cardiac Monitoring: / EKG:  The patient was maintained on a cardiac monitor.  I personally viewed and interpreted the cardiac monitored which showed an underlying rhythm of: Normal sinus rhythm   Consultations Obtained:  No consultant Problem List / ED Course / Critical interventions / Medication management  Hematuria and prostate cancer I ordered medication including no  medicines were ordered Reevaluation of the patient after these medicines showed that the patient stayed the same I have reviewed the patients home medicines and have made adjustments as needed   Social Determinants of Health:  None   Test / Admission - Considered:  Urine culture pending  Patient with hematuria.  We will do a urine culture and he will continue his antibiotics.  He will follow-up with urology or his oncologist next week        Final Clinical Impression(s) / ED Diagnoses Final diagnoses:  Gross hematuria    Rx / DC Orders ED Discharge Orders     None         Milton Ferguson, MD 09/16/22 1052

## 2022-09-16 LAB — URINE CULTURE: Culture: NO GROWTH

## 2022-09-22 ENCOUNTER — Encounter: Payer: Self-pay | Admitting: Radiology

## 2022-10-04 ENCOUNTER — Other Ambulatory Visit: Payer: Self-pay

## 2022-10-04 DIAGNOSIS — C61 Malignant neoplasm of prostate: Secondary | ICD-10-CM

## 2022-10-05 ENCOUNTER — Inpatient Hospital Stay: Payer: Medicaid Other | Attending: Hematology

## 2022-10-05 DIAGNOSIS — M25552 Pain in left hip: Secondary | ICD-10-CM | POA: Insufficient documentation

## 2022-10-05 DIAGNOSIS — M25551 Pain in right hip: Secondary | ICD-10-CM | POA: Insufficient documentation

## 2022-10-05 DIAGNOSIS — Z807 Family history of other malignant neoplasms of lymphoid, hematopoietic and related tissues: Secondary | ICD-10-CM | POA: Insufficient documentation

## 2022-10-05 DIAGNOSIS — Z8042 Family history of malignant neoplasm of prostate: Secondary | ICD-10-CM | POA: Diagnosis not present

## 2022-10-05 DIAGNOSIS — Z5111 Encounter for antineoplastic chemotherapy: Secondary | ICD-10-CM | POA: Diagnosis not present

## 2022-10-05 DIAGNOSIS — M8580 Other specified disorders of bone density and structure, unspecified site: Secondary | ICD-10-CM | POA: Diagnosis not present

## 2022-10-05 DIAGNOSIS — C772 Secondary and unspecified malignant neoplasm of intra-abdominal lymph nodes: Secondary | ICD-10-CM | POA: Insufficient documentation

## 2022-10-05 DIAGNOSIS — C61 Malignant neoplasm of prostate: Secondary | ICD-10-CM | POA: Insufficient documentation

## 2022-10-05 DIAGNOSIS — Z801 Family history of malignant neoplasm of trachea, bronchus and lung: Secondary | ICD-10-CM | POA: Insufficient documentation

## 2022-10-05 DIAGNOSIS — K5900 Constipation, unspecified: Secondary | ICD-10-CM | POA: Insufficient documentation

## 2022-10-05 LAB — COMPREHENSIVE METABOLIC PANEL
ALT: 9 U/L (ref 0–44)
AST: 16 U/L (ref 15–41)
Albumin: 3.6 g/dL (ref 3.5–5.0)
Alkaline Phosphatase: 68 U/L (ref 38–126)
Anion gap: 9 (ref 5–15)
BUN: 15 mg/dL (ref 8–23)
CO2: 29 mmol/L (ref 22–32)
Calcium: 9.1 mg/dL (ref 8.9–10.3)
Chloride: 102 mmol/L (ref 98–111)
Creatinine, Ser: 0.86 mg/dL (ref 0.61–1.24)
GFR, Estimated: >60 mL/min (ref >60)
Glucose, Bld: 99 mg/dL (ref 70–99)
Potassium: 3.9 mmol/L (ref 3.5–5.1)
Sodium: 140 mmol/L (ref 135–145)
Total Bilirubin: 0.7 mg/dL (ref 0.3–1.2)
Total Protein: 7 g/dL (ref 6.5–8.1)

## 2022-10-05 LAB — CBC WITH DIFFERENTIAL/PLATELET
Abs Immature Granulocytes: 0.14 10*3/uL — ABNORMAL HIGH (ref 0.00–0.07)
Basophils Absolute: 0.1 10*3/uL (ref 0.0–0.1)
Basophils Relative: 0 %
Eosinophils Absolute: 0.1 10*3/uL (ref 0.0–0.5)
Eosinophils Relative: 1 %
HCT: 39.4 % (ref 39.0–52.0)
Hemoglobin: 12.9 g/dL — ABNORMAL LOW (ref 13.0–17.0)
Immature Granulocytes: 1 %
Lymphocytes Relative: 17 %
Lymphs Abs: 2.1 10*3/uL (ref 0.7–4.0)
MCH: 31.9 pg (ref 26.0–34.0)
MCHC: 32.7 g/dL (ref 30.0–36.0)
MCV: 97.5 fL (ref 80.0–100.0)
Monocytes Absolute: 1.2 10*3/uL — ABNORMAL HIGH (ref 0.1–1.0)
Monocytes Relative: 10 %
Neutro Abs: 8.8 10*3/uL — ABNORMAL HIGH (ref 1.7–7.7)
Neutrophils Relative %: 71 %
Platelets: 290 10*3/uL (ref 150–400)
RBC: 4.04 MIL/uL — ABNORMAL LOW (ref 4.22–5.81)
RDW: 13.2 % (ref 11.5–15.5)
WBC: 12.3 10*3/uL — ABNORMAL HIGH (ref 4.0–10.5)
nRBC: 0 % (ref 0.0–0.2)

## 2022-10-05 LAB — MAGNESIUM: Magnesium: 2 mg/dL (ref 1.7–2.4)

## 2022-10-05 LAB — PSA: Prostatic Specific Antigen: 0.05 ng/mL (ref 0.00–4.00)

## 2022-10-11 NOTE — Progress Notes (Signed)
[facility]all [address] Randall Dawson    Clinic Day:  10/12/2022  Referring physician: Glenda Chroman, MD  Patient Care Team: Randall Chroman, MD as PCP - General (Internal Medicine) Randall Sark, MD as PCP - Cardiology (Cardiology) Randall Jack, MD as Medical Oncologist (Medical Oncology)   ASSESSMENT & PLAN:   Assessment: 1.  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  Serum testosterone on 12/17/2019 of 197. -Bone scan on 12/18/2019 at [facility] shows small focus of uptake seen left inferior orbit favoring benign etiology.  No suspicious foci of bone meta stasis. -CTAP with contrast on 12/18/2019 showed retroperitoneal and bilateral iliac chain metastatic adenopathy.  Left para-aortic lymph node measures 15 mm.  Right external iliac lymph node measures 22 x 35 mm.  Left external iliac lymph node measures 26 x 35 mm.  Ill-defined hypoenhancing 12 mm hepatic lesion, indeterminate, however concerning for metastasis.  Sclerotic focus in the right posterior ilium, indeterminate. -MRI of the abdomen on 01/10/2020 shows tiny 8 mm low-attenuation lesion in the posterior right hepatic lobe, too small to characterize.  No other significant abnormalities. -Abiraterone and prednisone started around 02/01/2020.  Degarelix started on 01/29/2020. -Lupron 45 mg on 02/26/2020.   2.  Family history: -Father died of prostate cancer at age 82.  Sister had cancer on her leg resected.  Paternal uncle had cancer, patient does not know the type. - Germline mutation testing was negative.  Plan: 1.  Metastatic castration sensitive prostate cancer: - He is tolerating Abiraterone and prednisone reasonably well. - Minor hot flashes stable. - Reviewed labs today which showed normal LFTs, potassium and renal function.  CBC shows mild leukocytosis, likely  from prednisone.  PSA 0.05 and stable from December. - Continue Abiraterone 1000 mg daily and prednisone 5 mg daily.  He will receive Eligard 45 mg today. - RTC 3 months for follow-up.   2.  Bilateral hip pains: - These are still present and stable.  No intervention necessary.   3.  Constipation: - Continue stool softeners and milk of magnesia.   4.  Osteopenia (DEXA scan 07/15/2021 T score -1.7): - Calcium is normal today.  He denies any dental issues.  He will proceed with Prolia today.  Orders Placed This Encounter  Procedures   PHYSICIAN COMMUNICATION ORDER    Screen for diabetes and CV risk prior to initiating therapy and periodically.   SCHEDULING COMMUNICATION INJECTION    1 hour injection appointment every 6 months      I,Katie Daubenspeck,acting as a scribe for Randall Jack, MD.,have documented all relevant documentation on the behalf of Randall Jack, MD,as directed by  Randall Jack, MD while in the presence of Randall Jack, MD.   I, Randall Jack MD, have reviewed the above documentation for accuracy and completeness, and I agree with the above.   Randall Jack, MD   3/20/202412:40 PM  CHIEF COMPLAINT:   Diagnosis: metastatic castration sensitive prostate cancer to the retroperitoneal lymph nodes    Cancer Staging  No matching staging information was found for the patient.   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 The report date is 03/15/2020.   The Common Hereditary Cancers Panel offered by [facility] includes sequencing and/or deletion  duplication testing of the following 47 genes: APC, ATM, AXIN2, BARD1, BMPR1A, BRCA1, BRCA2, BRIP1, CDH1, CDK4, CDKN2A (p14ARF), CDKN2A (p16INK4a), CHEK2, CTNNA1, DICER1, EPCAM (Deletion/duplication testing only), GREM1 (promoter region deletion/duplication testing only), KIT, MEN1, MLH1, MSH2, MSH3, MSH6, MUTYH, NBN, NF1, NTHL1, PALB2, PDGFRA, PMS2, POLD1, POLE, PTEN, RAD50, RAD51C, RAD51D, SDHB, SDHC, SDHD, SMAD4, SMARCA4. STK11, TP53, TSC1, TSC2, and VHL.  The following genes were evaluated for sequence changes only: SDHA and HOXB13 c.251G>A variant only. The Prostate Cancer HRR Panel offered by [facility] includes sequencing and/or deletion/duplication analysis of the following 10 genes: ATM, BARD1, BRCA1, BRCA2, BRIP1, CHEK2, FANCL, PALB2, RAD51C, RAD51D.      INTERVAL HISTORY:   [name] is a 62 y.o. male presenting to clinic today for follow up of metastatic castration sensitive prostate cancer to the retroperitoneal lymph node. He was last seen by me on 07/13/22.  Since his last visit, he was seen in the ED on 09/14/22 for gross hematuria. Urine culture performed at that time was negative.  Today, he states that he is doing well overall. His appetite level is at 80%. His energy level is at 60%. He denies any denies any new onset pains.  Has chronic bilateral hip pains which are stable.   PAST MEDICAL HISTORY:   Past Medical History: Past Medical History:  Diagnosis Date   Family history of lung cancer    Family history of lymphoma    Family history of prostate cancer    History of hepatitis B 01/14/2020   Prostate cancer metastatic to intraabdominal lymph node ([place])     Surgical History: Past Surgical History:  Procedure Laterality Date   FOREIGN BODY REMOVAL N/A 07/13/2017   Procedure: FOREIGN BODY REMOVAL ADULT;  Surgeon: Randall Seminole, MD;  Location: [facility];  Service: ENT;  Laterality: N/A;   LEG SURGERY     rods in leg from fracture   RIGID ESOPHAGOSCOPY N/A 07/13/2017    Procedure: RIGID ESOPHAGOSCOPY;  Surgeon: Randall Seminole, MD;  Location: [facility];  Service: ENT;  Laterality: N/A;    Social History: Social History   Socioeconomic History   Marital status: Single    Spouse name: Not on file   Number of children: 0   Years of education: Not on file   Highest education level: Not on file  Occupational History   Occupation: EMPLOYED  Tobacco Use   Smoking status: Never   Smokeless tobacco: Never  Vaping Use   Vaping Use: Never used  Substance and Sexual Activity   Alcohol use: No   Drug use: No   Sexual activity: Not on file  Other Topics Concern   Not on file  Social History Narrative   Not on file   Social Determinants of Health   Financial Resource Strain: Medium Risk (03/15/2021)   Overall Financial Resource Strain (CARDIA)    Difficulty of Paying Living Expenses: Somewhat hard  Food Insecurity: No Food Insecurity (03/15/2021)   Hunger Vital Sign    Worried About Running Out of Food in the Last Year: Never true    Ran Out of Food in the Last Year: Never true  Transportation Needs: No Transportation Needs (03/15/2021)   PRAPARE - Hydrologist (Medical): No  Lack of Transportation (Non-Medical): No  Physical Activity: Inactive (03/15/2021)   Exercise Vital Sign    Days of Exercise per Week: 0 days    Minutes of Exercise per Session: 0 min  Stress: Stress Concern Present (03/15/2021)   [place]    Feeling of Stress : Rather much  Social Connections: Socially Isolated (03/15/2021)   Social Connection and Isolation Panel [NHANES]    Frequency of Communication with Friends and Family: Never    Frequency of Social Gatherings with Friends and Family: Never    Attends Religious Services: Never    Marine scientist or Organizations: No    Attends Archivist Meetings: Never    Marital Status: Divorced  Human resources officer  Violence: Not At Risk (03/15/2021)   Humiliation, Afraid, Rape, and Kick questionnaire    Fear of Current or Ex-Partner: No    Emotionally Abused: No    Physically Abused: No    Sexually Abused: No    Family History: Family History  Problem Relation Age of Onset   Dementia Mother    Prostate cancer Father 5       metastatic   Cancer Sister        cancer on leg, dx. in her 46s   Lymphoma Brother 51   Lung cancer Paternal Aunt        dx. >50    Current Medications:  Current Outpatient Medications:    abiraterone acetate (ZYTIGA) 250 MG tablet, Take 4 tablets (1,000 mg total) by mouth daily. Take on an empty stomach 1 hour before or 2 hours after a meal, Disp: 120 tablet, Rfl: 11   HYDROcodone-acetaminophen (NORCO/VICODIN) 5-325 MG tablet, Take 1 tablet by mouth every 4 (four) hours as needed., Disp: , Rfl:    ondansetron (ZOFRAN) 4 MG tablet, Take 4 mg by mouth 3 (three) times daily as needed for nausea., Disp: , Rfl:    predniSONE (DELTASONE) 5 MG tablet, TAKE 1 TABLET(5 MG) BY MOUTH TWICE DAILY WITH A MEAL, Disp: 60 tablet, Rfl: 11   prochlorperazine (COMPAZINE) 10 MG tablet, Take 1 tablet (10 mg total) by mouth every 6 (six) hours as needed for nausea or vomiting. (Patient not taking: Reported on 09/14/2022), Disp: 60 tablet, Rfl: 2   Allergies: No Known Allergies  REVIEW OF SYSTEMS:   Review of Systems  Constitutional:  Negative for chills, fatigue and fever.  HENT:   Negative for lump/mass, mouth sores, nosebleeds, sore throat and trouble swallowing.   Eyes:  Negative for eye problems.  Respiratory:  Negative for cough and shortness of breath.   Cardiovascular:  Negative for chest pain, leg swelling and palpitations.  Gastrointestinal:  Positive for constipation. Negative for abdominal pain, diarrhea, nausea and vomiting.  Genitourinary:  Negative for bladder incontinence, difficulty urinating, dysuria, frequency, hematuria and nocturia.   Musculoskeletal:  Negative for  arthralgias, back pain, flank pain, myalgias and neck pain.  Skin:  Negative for itching and rash.  Neurological:  Negative for dizziness, headaches and numbness.  Hematological:  Does not bruise/bleed easily.  Psychiatric/Behavioral:  Positive for depression and sleep disturbance. Negative for suicidal ideas. The patient is nervous/anxious.   All other systems reviewed and are negative.    VITALS:   Blood pressure 131/86, pulse 92, temperature 97.6 F (36.4 C), resp. rate 18, weight 230 lb 1.6 oz (104.4 kg), SpO2 99 %.  Wt Readings from Last 3 Encounters:  10/12/22 230 lb 1.6 oz (104.4  kg)  09/14/22 233 lb (105.7 kg)  07/13/22 229 lb 4.8 oz (104 kg)    Body mass index is 33.98 kg/m.  Performance status (ECOG): 1 - Symptomatic but completely ambulatory  PHYSICAL EXAM:   Physical Exam Vitals and nursing note reviewed. Exam conducted with a chaperone present.  Constitutional:      Appearance: Normal appearance.  Cardiovascular:     Rate and Rhythm: Normal rate and regular rhythm.     Pulses: Normal pulses.     Heart sounds: Normal heart sounds.  Pulmonary:     Effort: Pulmonary effort is normal.     Breath sounds: Normal breath sounds.  Abdominal:     Palpations: Abdomen is soft. There is no hepatomegaly, splenomegaly or mass.     Tenderness: There is no abdominal tenderness.  Musculoskeletal:     Right lower leg: No edema.     Left lower leg: No edema.  Lymphadenopathy:     Cervical: No cervical adenopathy.     Right cervical: No superficial, deep or posterior cervical adenopathy.    Left cervical: No superficial, deep or posterior cervical adenopathy.     Upper Body:     Right upper body: No supraclavicular or axillary adenopathy.     Left upper body: No supraclavicular or axillary adenopathy.  Neurological:     General: No focal deficit present.     Mental Status: He is alert and oriented to person, place, and time.  Psychiatric:        Mood and Affect: Mood  normal.        Behavior: Behavior normal.     LABS:      Latest Ref Rng & Units 10/05/2022   10:48 AM 09/14/2022    9:43 AM 07/06/2022    9:51 AM  CBC  WBC 4.0 - 10.5 K/uL 12.3  8.9  11.5   Hemoglobin 13.0 - 17.0 g/dL 12.9  13.2  12.3   Hematocrit 39.0 - 52.0 % 39.4  40.6  38.4   Platelets 150 - 400 K/uL 290  289  291       Latest Ref Rng & Units 10/05/2022   10:48 AM 09/14/2022    9:43 AM 07/06/2022    9:51 AM  CMP  Glucose 70 - 99 mg/dL 99  93  106   BUN 8 - 23 mg/dL 15  21  15    Creatinine 0.61 - 1.24 mg/dL 0.86  0.92  0.83   Sodium 135 - 145 mmol/L 140  140  141   Potassium 3.5 - 5.1 mmol/L 3.9  3.6  3.7   Chloride 98 - 111 mmol/L 102  104  107   CO2 22 - 32 mmol/L 29  28  28    Calcium 8.9 - 10.3 mg/dL 9.1  8.9  9.1   Total Protein 6.5 - 8.1 g/dL 7.0   6.8   Total Bilirubin 0.3 - 1.2 mg/dL 0.7   0.6   Alkaline Phos 38 - 126 U/L 68   59   AST 15 - 41 U/L 16   13   ALT 0 - 44 U/L 9   8      No results found for: "CEA1", "CEA" / No results found for: "CEA1", "CEA" No results found for: "PSA1" No results found for: "[id number]" No results found for: "CAN125"  No results found for: "TOTALPROTELP", "ALBUMINELP", "A1GS", "A2GS", "BETS", "BETA2SER", "GAMS", "MSPIKE", "SPEI" No results found for: "TIBC", "FERRITIN", "IRONPCTSAT" Lab Results  Component Value Date  LDH 167 04/20/2020   LDH 145 02/17/2020     STUDIES:   No results found.

## 2022-10-12 ENCOUNTER — Inpatient Hospital Stay (HOSPITAL_BASED_OUTPATIENT_CLINIC_OR_DEPARTMENT_OTHER): Payer: Medicaid Other | Admitting: Hematology

## 2022-10-12 ENCOUNTER — Encounter: Payer: Self-pay | Admitting: Hematology

## 2022-10-12 ENCOUNTER — Inpatient Hospital Stay: Payer: Medicaid Other

## 2022-10-12 VITALS — BP 131/86 | HR 92 | Temp 97.6°F | Resp 18 | Wt 230.1 lb

## 2022-10-12 DIAGNOSIS — C772 Secondary and unspecified malignant neoplasm of intra-abdominal lymph nodes: Secondary | ICD-10-CM | POA: Diagnosis not present

## 2022-10-12 DIAGNOSIS — Z5111 Encounter for antineoplastic chemotherapy: Secondary | ICD-10-CM | POA: Diagnosis not present

## 2022-10-12 DIAGNOSIS — C61 Malignant neoplasm of prostate: Secondary | ICD-10-CM | POA: Diagnosis not present

## 2022-10-12 MED ORDER — DENOSUMAB 60 MG/ML ~~LOC~~ SOSY: 60.0000 mg | PREFILLED_SYRINGE | Freq: Once | SUBCUTANEOUS | Status: DC

## 2022-10-12 MED ORDER — LEUPROLIDE ACETATE (6 MONTH) 45 MG ~~LOC~~ KIT
45.0000 mg | PACK | Freq: Once | SUBCUTANEOUS | Status: AC
  Administered 2022-10-12: 45 mg via SUBCUTANEOUS
  Filled 2022-10-12: qty 45

## 2022-10-12 MED ORDER — DENOSUMAB 60 MG/ML ~~LOC~~ SOSY
60.0000 mg | PREFILLED_SYRINGE | Freq: Once | SUBCUTANEOUS | Status: AC
  Administered 2022-10-12: 60 mg via SUBCUTANEOUS

## 2022-10-12 NOTE — Progress Notes (Signed)
Randall Dawson presents today for Eligard and Prolia injections per the provider's orders.  Stable during administration without incident; injection sites WNL; see MAR for injection details.  Patient tolerated procedure well and without incident.  No questions or complaints noted at this time.

## 2022-10-12 NOTE — Progress Notes (Signed)
Patient is taking Zytiga as prescribed.  He has not missed any doses and reports no side effects at this time.   

## 2022-10-12 NOTE — Patient Instructions (Signed)
Randall Dawson at [facility] Discharge Instructions   You were seen and examined today by Dr. Delton Coombes.  He reviewed the results of your lab work which are normal.   We will give Prolia and Eligard injections today.   Return as scheduled.    Thank you for choosing [place] at [facility] to provide your oncology and hematology care.  To afford each patient quality time with our provider, please arrive at least 15 minutes before your scheduled appointment time.   If you have a lab appointment with the [facility] please come in thru the Main Entrance and check in at the main information desk.  You need to re-schedule your appointment should you arrive 10 or more minutes late.  We strive to give you quality time with our providers, and arriving late affects you and other patients whose appointments are after yours.  Also, if you no show three or more times for appointments you may be dismissed from the clinic at the providers discretion.     Again, thank you for choosing [facility].  Our hope is that these requests will decrease the amount of time that you wait before being seen by our physicians.       _____________________________________________________________  Should you have questions after your visit to [facility], please contact our office at [phone] and follow the prompts.  Our office hours are 8:00 a.m. and 4:30 p.m. Monday - Friday.  Please note that voicemails left after 4:00 p.m. may not be returned until the following business day.  We are closed weekends and major holidays.  You do have access to a nurse 24-7, just call the main number to the clinic [phone] and do not press any options, hold on the line and a nurse will answer the phone.    For prescription refill requests, have your pharmacy contact our office and allow 72 hours.    Due to Covid, you will need to wear a mask upon entering  the hospital. If you do not have a mask, a mask will be given to you at the Main Entrance upon arrival. For doctor visits, patients may have 1 support person age 37 or older with them. For treatment visits, patients can not have anyone with them due to social distancing guidelines and our immunocompromised population.

## 2022-10-12 NOTE — Patient Instructions (Signed)
[facility]  Discharge Instructions: Thank you for choosing [facility] to provide your oncology and hematology care.  If you have a lab appointment with the [facility], please come in thru the Main Entrance and check in at the main information desk.  Wear comfortable clothing and clothing appropriate for easy access to any Portacath or PICC line.   We strive to give you quality time with your provider. You may need to reschedule your appointment if you arrive late (15 or more minutes).  Arriving late affects you and other patients whose appointments are after yours.  Also, if you miss three or more appointments without notifying the office, you may be dismissed from the clinic at the provider's discretion.      For prescription refill requests, have your pharmacy contact our office and allow 72 hours for refills to be completed.    Today you received the following chemotherapy and/or immunotherapy agents eligard and prolia      To help prevent nausea and vomiting after your treatment, we encourage you to take your nausea medication as directed.  BELOW ARE SYMPTOMS THAT SHOULD BE REPORTED IMMEDIATELY: *FEVER GREATER THAN 100.4 F (38 C) OR HIGHER *CHILLS OR SWEATING *NAUSEA AND VOMITING THAT IS NOT CONTROLLED WITH YOUR NAUSEA MEDICATION *UNUSUAL SHORTNESS OF BREATH *UNUSUAL BRUISING OR BLEEDING *URINARY PROBLEMS (pain or burning when urinating, or frequent urination) *BOWEL PROBLEMS (unusual diarrhea, constipation, pain near the anus) TENDERNESS IN MOUTH AND THROAT WITH OR WITHOUT PRESENCE OF ULCERS (sore throat, sores in mouth, or a toothache) UNUSUAL RASH, SWELLING OR PAIN  UNUSUAL VAGINAL DISCHARGE OR ITCHING   Items with * indicate a potential emergency and should be followed up as soon as possible or go to the Emergency Department if any problems should occur.  Please show the CHEMOTHERAPY ALERT CARD or IMMUNOTHERAPY ALERT CARD at check-in to  the Emergency Department and triage nurse.  Should you have questions after your visit or need to cancel or reschedule your appointment, please contact [facility] [phone]  and follow the prompts.  Office hours are 8:00 a.m. to 4:30 p.m. Monday - Friday. Please note that voicemails left after 4:00 p.m. may not be returned until the following business day.  We are closed weekends and major holidays. You have access to a nurse at all times for urgent questions. Please call the main number to the clinic [phone] and follow the prompts.  For any non-urgent questions, you may also contact your provider using MyChart. We now offer e-Visits for anyone 67 and older to request care online for non-urgent symptoms. For details visit [email or web address].   Also download the MyChart app! Go to the app store, search "MyChart", open the app, select [facility], and log in with your MyChart username and password.

## 2023-01-10 ENCOUNTER — Other Ambulatory Visit: Payer: Self-pay

## 2023-01-10 DIAGNOSIS — C61 Malignant neoplasm of prostate: Secondary | ICD-10-CM

## 2023-01-11 ENCOUNTER — Inpatient Hospital Stay: Payer: Medicaid Other | Attending: Hematology | Admitting: Hematology

## 2023-01-11 DIAGNOSIS — Z809 Family history of malignant neoplasm, unspecified: Secondary | ICD-10-CM | POA: Diagnosis not present

## 2023-01-11 DIAGNOSIS — K5900 Constipation, unspecified: Secondary | ICD-10-CM | POA: Insufficient documentation

## 2023-01-11 DIAGNOSIS — Z191 Hormone sensitive malignancy status: Secondary | ICD-10-CM | POA: Diagnosis not present

## 2023-01-11 DIAGNOSIS — Z8042 Family history of malignant neoplasm of prostate: Secondary | ICD-10-CM | POA: Diagnosis not present

## 2023-01-11 DIAGNOSIS — Z806 Family history of leukemia: Secondary | ICD-10-CM | POA: Insufficient documentation

## 2023-01-11 DIAGNOSIS — C61 Malignant neoplasm of prostate: Secondary | ICD-10-CM | POA: Diagnosis present

## 2023-01-11 DIAGNOSIS — M8580 Other specified disorders of bone density and structure, unspecified site: Secondary | ICD-10-CM | POA: Diagnosis not present

## 2023-01-11 DIAGNOSIS — C772 Secondary and unspecified malignant neoplasm of intra-abdominal lymph nodes: Secondary | ICD-10-CM | POA: Insufficient documentation

## 2023-01-11 DIAGNOSIS — Z7952 Long term (current) use of systemic steroids: Secondary | ICD-10-CM | POA: Insufficient documentation

## 2023-01-11 DIAGNOSIS — R232 Flushing: Secondary | ICD-10-CM | POA: Diagnosis not present

## 2023-01-11 DIAGNOSIS — E876 Hypokalemia: Secondary | ICD-10-CM | POA: Insufficient documentation

## 2023-01-11 DIAGNOSIS — Z79899 Other long term (current) drug therapy: Secondary | ICD-10-CM | POA: Diagnosis not present

## 2023-01-11 DIAGNOSIS — Z801 Family history of malignant neoplasm of trachea, bronchus and lung: Secondary | ICD-10-CM | POA: Diagnosis not present

## 2023-01-11 LAB — COMPREHENSIVE METABOLIC PANEL
ALT: 9 U/L (ref 0–44)
AST: 14 U/L — ABNORMAL LOW (ref 15–41)
Albumin: 3.7 g/dL (ref 3.5–5.0)
Alkaline Phosphatase: 69 U/L (ref 38–126)
Anion gap: 9 (ref 5–15)
BUN: 18 mg/dL (ref 8–23)
CO2: 26 mmol/L (ref 22–32)
Calcium: 8.8 mg/dL — ABNORMAL LOW (ref 8.9–10.3)
Chloride: 104 mmol/L (ref 98–111)
Creatinine, Ser: 0.94 mg/dL (ref 0.61–1.24)
GFR, Estimated: >60 mL/min (ref >60)
Glucose, Bld: 123 mg/dL — ABNORMAL HIGH (ref 70–99)
Potassium: 2.9 mmol/L — ABNORMAL LOW (ref 3.5–5.1)
Sodium: 139 mmol/L (ref 135–145)
Total Bilirubin: 0.9 mg/dL (ref 0.3–1.2)
Total Protein: 6.9 g/dL (ref 6.5–8.1)

## 2023-01-11 LAB — CBC WITH DIFFERENTIAL/PLATELET
Abs Immature Granulocytes: 0.11 10*3/uL — ABNORMAL HIGH (ref 0.00–0.07)
Basophils Absolute: 0.1 10*3/uL (ref 0.0–0.1)
Basophils Relative: 0 %
Eosinophils Absolute: 0.2 10*3/uL (ref 0.0–0.5)
Eosinophils Relative: 2 %
HCT: 40.5 % (ref 39.0–52.0)
Hemoglobin: 13.4 g/dL (ref 13.0–17.0)
Immature Granulocytes: 1 %
Lymphocytes Relative: 23 %
Lymphs Abs: 2.9 10*3/uL (ref 0.7–4.0)
MCH: 32.2 pg (ref 26.0–34.0)
MCHC: 33.1 g/dL (ref 30.0–36.0)
MCV: 97.4 fL (ref 80.0–100.0)
Monocytes Absolute: 1.1 10*3/uL — ABNORMAL HIGH (ref 0.1–1.0)
Monocytes Relative: 9 %
Neutro Abs: 8 10*3/uL — ABNORMAL HIGH (ref 1.7–7.7)
Neutrophils Relative %: 65 %
Platelets: 273 10*3/uL (ref 150–400)
RBC: 4.16 MIL/uL — ABNORMAL LOW (ref 4.22–5.81)
RDW: 13 % (ref 11.5–15.5)
WBC: 12.4 10*3/uL — ABNORMAL HIGH (ref 4.0–10.5)
nRBC: 0 % (ref 0.0–0.2)

## 2023-01-11 LAB — MAGNESIUM: Magnesium: 2.1 mg/dL (ref 1.7–2.4)

## 2023-01-11 LAB — PSA: Prostatic Specific Antigen: 0.03 ng/mL (ref 0.00–4.00)

## 2023-01-17 ENCOUNTER — Other Ambulatory Visit: Payer: Self-pay | Admitting: *Deleted

## 2023-01-17 DIAGNOSIS — E876 Hypokalemia: Secondary | ICD-10-CM

## 2023-01-17 MED ORDER — POTASSIUM CHLORIDE CRYS ER 20 MEQ PO TBCR: 40.0000 meq | EXTENDED_RELEASE_TABLET | Freq: Once | ORAL | 3 refills | Status: DC

## 2023-01-17 NOTE — Progress Notes (Signed)
K 2.9 brought to attention of Dr. Ellin Saba.  Per his recommendation 40 meq K+ to be taken today sent to [facility] in [place].  Will recheck K tomorrow.  Patient aware.

## 2023-01-18 ENCOUNTER — Encounter (HOSPITAL_COMMUNITY): Payer: Self-pay | Admitting: Hematology

## 2023-01-18 ENCOUNTER — Encounter: Payer: Self-pay | Admitting: Hematology

## 2023-01-18 ENCOUNTER — Inpatient Hospital Stay (HOSPITAL_BASED_OUTPATIENT_CLINIC_OR_DEPARTMENT_OTHER): Payer: Medicaid Other | Admitting: Hematology

## 2023-01-18 ENCOUNTER — Inpatient Hospital Stay: Payer: Medicaid Other

## 2023-01-18 VITALS — BP 118/86 | HR 102 | Temp 98.5°F | Resp 18 | Wt 230.0 lb

## 2023-01-18 DIAGNOSIS — C772 Secondary and unspecified malignant neoplasm of intra-abdominal lymph nodes: Secondary | ICD-10-CM

## 2023-01-18 DIAGNOSIS — C61 Malignant neoplasm of prostate: Secondary | ICD-10-CM | POA: Diagnosis not present

## 2023-01-18 DIAGNOSIS — E876 Hypokalemia: Secondary | ICD-10-CM | POA: Diagnosis not present

## 2023-01-18 LAB — POTASSIUM: Potassium: 4.3 mmol/L (ref 3.5–5.1)

## 2023-01-18 NOTE — Patient Instructions (Signed)
[facility]  Discharge Instructions  You were seen and examined today by Dr. Ellin Saba.  Dr. Ellin Saba discussed your most recent lab work which revealed that everything looks good.  The Zytiga can sometimes cause your potassium to be low. Dr. Ellin Saba is going to recheck your potassium again next week.  Follow-up as scheduled.    Thank you for choosing [facility] to provide your oncology and hematology care.   To afford each patient quality time with our provider, please arrive at least 15 minutes before your scheduled appointment time. You may need to reschedule your appointment if you arrive late (10 or more minutes). Arriving late affects you and other patients whose appointments are after yours.  Also, if you miss three or more appointments without notifying the office, you may be dismissed from the clinic at the provider's discretion.    Again, thank you for choosing [facility].  Our hope is that these requests will decrease the amount of time that you wait before being seen by our physicians.   If you have a lab appointment with the Cancer Center - please note that after April 8th, all labs will be drawn in the cancer center.  You do not have to check in or register with the main entrance as you have in the past but will complete your check-in at the cancer center.            _____________________________________________________________  Should you have questions after your visit to [facility], please contact our office at [phone] and follow the prompts.  Our office hours are 8:00 a.m. to 4:30 p.m. Monday - Thursday and 8:00 a.m. to 2:30 p.m. Friday.  Please note that voicemails left after 4:00 p.m. may not be returned until the following business day.  We are closed weekends and all major holidays.  You do have access to a nurse 24-7, just call the main number to the clinic [phone] and  do not press any options, hold on the line and a nurse will answer the phone.    For prescription refill requests, have your pharmacy contact our office and allow 72 hours.    Masks are no longer required in the cancer centers. If you would like for your care team to wear a mask while they are taking care of you, please let them know. You may have one support person who is at least 62 years old accompany you for your appointments.

## 2023-01-18 NOTE — Progress Notes (Signed)
Tolerating Zytiga with no associated side effects

## 2023-01-18 NOTE — Progress Notes (Signed)
[facility] [address] [place] [address]    Clinic Day:  01/19/2023  Referring physician: Ignatius Specking, MD  Patient Care Team: Ignatius Specking, MD as PCP - General (Internal Medicine) Jonelle Sidle, MD as PCP - Cardiology (Cardiology) Doreatha Massed, MD as Medical Oncologist (Medical Oncology)   ASSESSMENT & PLAN:   Assessment: 1.  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  Serum testosterone on 12/17/2019 of 197. -B[facility] on 12/18/2019 at [facility] shows small focus of uptake seen left inferior orbit favoring benign etiology.  No suspicious foci of bone meta stasis. -CTAP with contrast on 12/18/2019 showed retroperitoneal and bilateral iliac chain metastatic adenopathy.  Left para-aortic lymph node measures 15 mm.  Right external iliac lymph node measures 22 x 35 mm.  Left external iliac lymph node measures 26 x 35 mm.  Ill-defined hypoenhancing 12 mm hepatic lesion, indeterminate, however concerning for metastasis.  Sclerotic focus in the right posterior ilium, indeterminate. -MRI of the abdomen on 01/10/2020 shows tiny 8 mm low-attenuation lesion in the posterior right hepatic lobe, too small to characterize.  No other significant abnormalities. -Abiraterone and prednisone started around 02/01/2020.  Degarelix started on 01/29/2020. -Lupron 45 mg on 02/26/2020.   2.  Family history: -Father died of prostate cancer at age 56.  Sister had cancer on her leg resected.  Paternal uncle had cancer, patient does not know the type. - Germline mutation testing was negative.    Plan: 1.  Metastatic castration sensitive prostate cancer: - He is tolerating Abiraterone and prednisone very well.  He has occasional hot flashes which are tolerable. - Reviewed labs from 01/11/2023: Normal LFTs and creatinine.  CBC grossly normal.  PSA is  improved to 0.03. - Blood pressure is stable at 118/86.  Last Eligard on 10/12/2022. - Continue Abiraterone 1000 mg daily with prednisone 5 mg daily. - RTC 3 months for follow-up with repeat PSA and labs.   2.  Hypokalemia: - He had severe hypokalemia with potassium 2.9 on 01/11/2023. - We have given him 40 mEq of K-Dur.  We repeated potassium today which is 4.3. - Etiology includes side effect from Abiraterone. - Will check potassium next week and follow-up on it.  If it continues to be low, will start him on potassium 20 mill equivalents daily.   3.  Constipation: - Continue stool softeners and milk of magnesia.   4.  Osteopenia (DEXA scan 07/15/2021 T score -1.7): - Last Prolia on 10/12/2022. - Calcium is 8.8.  Continue calcium supplements.    Orders Placed This Encounter  Procedures   CBC with Differential/Platelet    Standing Status:   Future    Standing Expiration Date:   01/18/2024    Order Specific Question:   Release to patient    Answer:   Immediate   Comprehensive metabolic panel    Standing Status:   Future    Standing Expiration Date:   01/18/2024    Order Specific Question:   Release to patient    Answer:   Immediate   PSA    Standing Status:   Future    Standing Expiration Date:   01/18/2024   Potassium    Standing Status:   Future    Standing Expiration Date:   01/18/2024      I,Katie Daubenspeck,acting as a scribe for Doreatha Massed, MD.,have documented all relevant documentation  on the behalf of Doreatha Massed, MD,as directed by  Doreatha Massed, MD while in the presence of Doreatha Massed, MD.   I, Doreatha Massed MD, have reviewed the above documentation for accuracy and completeness, and I agree with the above.   Doreatha Massed, MD   6/27/20246:21 PM  CHIEF COMPLAINT:   Diagnosis: metastatic castration sensitive prostate cancer to the retroperitoneal lymph nodes    Cancer Staging  No matching staging information was found  for the patient.   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 The report date is 03/15/2020.   The Common Hereditary Cancers Panel offered by [facility] includes sequencing and/or deletion duplication testing of the following 47 genes: APC, ATM, AXIN2, BARD1, BMPR1A, BRCA1, BRCA2, BRIP1, CDH1, CDK4, CDKN2A (p14ARF), CDKN2A (p16INK4a), CHEK2, CTNNA1, DICER1, EPCAM (Deletion/duplication testing only), GREM1 (promoter region deletion/duplication testing only), KIT, MEN1, MLH1, MSH2, MSH3, MSH6, MUTYH, NBN, NF1, NTHL1, PALB2, PDGFRA, PMS2, POLD1, POLE, PTEN, RAD50, RAD51C, RAD51D, SDHB, SDHC, SDHD, SMAD4, SMARCA4. STK11, TP53, TSC1, TSC2, and VHL.  The following genes were evaluated for sequence changes only: SDHA and HOXB13 c.251G>A variant only. The Prostate Cancer HRR Panel offered by [facility] includes sequencing and/or deletion/duplication analysis of the following 10 genes: ATM, BARD1, BRCA1, BRCA2, BRIP1, CHEK2, FANCL, PALB2, RAD51C, RAD51D.      INTERVAL HISTORY:   Randall Dawson is a 62 y.o. male presenting to clinic today for follow up of metastatic castration sensitive prostate cancer to the retroperitoneal lymph nodes. He was last seen by me on 10/12/22.  Today, he states that he is doing well overall. His appetite level is at 100%. His energy level is at 50%.  PAST MEDICAL HISTORY:   Past Medical History: Past Medical History:  Diagnosis Date   Family history of lung cancer    Family history of lymphoma    Family history of prostate cancer    History of hepatitis B 01/14/2020   Prostate cancer metastatic to  intraabdominal lymph node (HCC)     Surgical History: Past Surgical History:  Procedure Laterality Date   FOREIGN BODY REMOVAL N/A 07/13/2017   Procedure: FOREIGN BODY REMOVAL ADULT;  Surgeon: Graylin Shiver, MD;  Location: [facility] OR;  Service: ENT;  Laterality: N/A;   LEG SURGERY     rods in leg from fracture   RIGID ESOPHAGOSCOPY N/A 07/13/2017   Procedure: RIGID ESOPHAGOSCOPY;  Surgeon: Graylin Shiver, MD;  Location: [facility] OR;  Service: ENT;  Laterality: N/A;    Social History: Social History   Socioeconomic History   Marital status: Single    Spouse name: Not on file   Number of children: 0   Years of education: Not on file   Highest education level: Not on file  Occupational History   Occupation: EMPLOYED  Tobacco Use   Smoking status: Never   Smokeless tobacco: Never  Vaping Use   Vaping Use: Never used  Substance and Sexual Activity   Alcohol use: No   Drug use: No   Sexual activity: Not on file  Other Topics Concern   Not on file  Social History Narrative  Not on file   Social Determinants of Health   Financial Resource Strain: Medium Risk (03/15/2021)   Overall Financial Resource Strain (CARDIA)    Difficulty of Paying Living Expenses: Somewhat hard  Food Insecurity: No Food Insecurity (03/15/2021)   Hunger Vital Sign    Worried About Running Out of Food in the Last Year: Never true    Ran Out of Food in the Last Year: Never true  Transportation Needs: No Transportation Needs (03/15/2021)   PRAPARE - Administrator, Civil Service (Medical): No    Lack of Transportation (Non-Medical): No  Physical Activity: Inactive (03/15/2021)   Exercise Vital Sign    Days of Exercise per Week: 0 days    Minutes of Exercise per Session: 0 min  Stress: Stress Concern Present (03/15/2021)   [employer] of Occupational Health - Occupational Stress Questionnaire    Feeling of Stress : Rather much  Social Connections: Socially Isolated (03/15/2021)    Social Connection and Isolation Panel [NHANES]    Frequency of Communication with Friends and Family: Never    Frequency of Social Gatherings with Friends and Family: Never    Attends Religious Services: Never    Database administrator or Organizations: No    Attends Banker Meetings: Never    Marital Status: Divorced  Catering manager Violence: Not At Risk (03/15/2021)   Humiliation, Afraid, Rape, and Kick questionnaire    Fear of Current or Ex-Partner: No    Emotionally Abused: No    Physically Abused: No    Sexually Abused: No    Family History: Family History  Problem Relation Age of Onset   Dementia Mother    Prostate cancer Father 98       metastatic   Cancer Sister        cancer on leg, dx. in her 55s   Lymphoma Brother 30   Lung cancer Paternal Aunt        dx. >50    Current Medications:  Current Outpatient Medications:    abiraterone acetate (ZYTIGA) 250 MG tablet, Take 4 tablets (1,000 mg total) by mouth daily. Take on an empty stomach 1 hour before or 2 hours after a meal, Disp: 120 tablet, Rfl: 11   citalopram (CELEXA) 20 MG tablet, Take 20 mg by mouth daily., Disp: , Rfl:    HYDROcodone-acetaminophen (NORCO/VICODIN) 5-325 MG tablet, Take 1 tablet by mouth every 4 (four) hours as needed., Disp: , Rfl:    ondansetron (ZOFRAN) 4 MG tablet, Take 4 mg by mouth 3 (three) times daily as needed for nausea., Disp: , Rfl:    predniSONE (DELTASONE) 5 MG tablet, TAKE 1 TABLET(5 MG) BY MOUTH TWICE DAILY WITH A MEAL, Disp: 60 tablet, Rfl: 11   prochlorperazine (COMPAZINE) 10 MG tablet, Take 1 tablet (10 mg total) by mouth every 6 (six) hours as needed for nausea or vomiting. (Patient not taking: Reported on 09/14/2022), Disp: 60 tablet, Rfl: 2   Allergies: No Known Allergies  REVIEW OF SYSTEMS:   Review of Systems  Constitutional:  Negative for chills, fatigue and fever.  HENT:   Negative for lump/mass, mouth sores, nosebleeds, sore throat and trouble  swallowing.   Eyes:  Negative for eye problems.  Respiratory:  Negative for cough and shortness of breath.   Cardiovascular:  Negative for chest pain, leg swelling and palpitations.  Gastrointestinal:  Positive for constipation. Negative for abdominal pain, diarrhea, nausea and vomiting.  Genitourinary:  Negative for bladder incontinence, difficulty  urinating, dysuria, frequency, hematuria and nocturia.   Musculoskeletal:  Positive for arthralgias. Negative for back pain, flank pain, myalgias and neck pain.  Skin:  Negative for itching and rash.  Neurological:  Negative for dizziness, headaches and numbness.  Hematological:  Does not bruise/bleed easily.  Psychiatric/Behavioral:  Negative for depression, sleep disturbance and suicidal ideas. The patient is not nervous/anxious.   All other systems reviewed and are negative.    VITALS:   Blood pressure 118/86, pulse (!) 102, temperature 98.5 F (36.9 C), temperature source Oral, resp. rate 18, weight 230 lb (104.3 kg), SpO2 95 %.  Wt Readings from Last 3 Encounters:  01/18/23 230 lb (104.3 kg)  10/12/22 230 lb 1.6 oz (104.4 kg)  09/14/22 233 lb (105.7 kg)    Body mass index is 33.97 kg/m.  Performance status (ECOG): 1 - Symptomatic but completely ambulatory  PHYSICAL EXAM:   Physical Exam Vitals and nursing note reviewed. Exam conducted with a chaperone present.  Constitutional:      Appearance: Normal appearance.  Cardiovascular:     Rate and Rhythm: Normal rate and regular rhythm.     Pulses: Normal pulses.     Heart sounds: Normal heart sounds.  Pulmonary:     Effort: Pulmonary effort is normal.     Breath sounds: Normal breath sounds.  Abdominal:     Palpations: Abdomen is soft. There is no hepatomegaly, splenomegaly or mass.     Tenderness: There is no abdominal tenderness.  Musculoskeletal:     Right lower leg: No edema.     Left lower leg: No edema.  Lymphadenopathy:     Cervical: No cervical adenopathy.      Right cervical: No superficial, deep or posterior cervical adenopathy.    Left cervical: No superficial, deep or posterior cervical adenopathy.     Upper Body:     Right upper body: No supraclavicular or axillary adenopathy.     Left upper body: No supraclavicular or axillary adenopathy.  Neurological:     General: No focal deficit present.     Mental Status: He is alert and oriented to person, place, and time.  Psychiatric:        Mood and Affect: Mood normal.        Behavior: Behavior normal.     LABS:      Latest Ref Rng & Units 01/11/2023    2:30 PM 10/05/2022   10:48 AM 09/14/2022    9:43 AM  CBC  WBC 4.0 - 10.5 K/uL 12.4  12.3  8.9   Hemoglobin 13.0 - 17.0 g/dL 16.1  09.6  04.5   Hematocrit 39.0 - 52.0 % 40.5  39.4  40.6   Platelets 150 - 400 K/uL 273  290  289       Latest Ref Rng & Units 01/18/2023    2:17 PM 01/11/2023    2:30 PM 10/05/2022   10:48 AM  CMP  Glucose 70 - 99 mg/dL  409  99   BUN 8 - 23 mg/dL  18  15   Creatinine 8.11 - 1.24 mg/dL  9.14  7.82   Sodium 956 - 145 mmol/L  139  140   Potassium 3.5 - 5.1 mmol/L 4.3  2.9  3.9   Chloride 98 - 111 mmol/L  104  102   CO2 22 - 32 mmol/L  26  29   Calcium 8.9 - 10.3 mg/dL  8.8  9.1   Total Protein 6.5 - 8.1 g/dL  6.9  7.0   Total Bilirubin 0.3 - 1.2 mg/dL  0.9  0.7   Alkaline Phos 38 - 126 U/L  69  68   AST 15 - 41 U/L  14  16   ALT 0 - 44 U/L  9  9      No results found for: "CEA1", "CEA" / No results found for: "CEA1", "CEA" No results found for: "PSA1" No results found for: "ZOX096" No results found for: "CAN125"  No results found for: "TOTALPROTELP", "ALBUMINELP", "A1GS", "A2GS", "BETS", "BETA2SER", "GAMS", "MSPIKE", "SPEI" No results found for: "TIBC", "FERRITIN", "IRONPCTSAT" Lab Results  Component Value Date   LDH 167 04/20/2020   LDH 145 02/17/2020     STUDIES:   No results found.

## 2023-01-19 ENCOUNTER — Encounter (HOSPITAL_COMMUNITY): Payer: Self-pay | Admitting: Hematology

## 2023-01-24 ENCOUNTER — Other Ambulatory Visit: Payer: Self-pay | Admitting: *Deleted

## 2023-01-24 ENCOUNTER — Other Ambulatory Visit: Payer: Self-pay | Admitting: Hematology

## 2023-01-24 ENCOUNTER — Inpatient Hospital Stay: Payer: Medicaid Other | Attending: Hematology

## 2023-01-24 DIAGNOSIS — C61 Malignant neoplasm of prostate: Secondary | ICD-10-CM

## 2023-01-24 DIAGNOSIS — E876 Hypokalemia: Secondary | ICD-10-CM | POA: Diagnosis not present

## 2023-01-24 LAB — POTASSIUM: Potassium: 3.6 mmol/L (ref 3.5–5.1)

## 2023-01-24 MED ORDER — POTASSIUM CHLORIDE ER 10 MEQ PO TBCR: 10.0000 meq | EXTENDED_RELEASE_TABLET | Freq: Every day | ORAL | 6 refills | Status: DC

## 2023-01-30 ENCOUNTER — Other Ambulatory Visit: Payer: Self-pay

## 2023-01-30 DIAGNOSIS — C61 Malignant neoplasm of prostate: Secondary | ICD-10-CM

## 2023-01-30 MED ORDER — ABIRATERONE ACETATE 250 MG PO TABS
1000.0000 mg | ORAL_TABLET | Freq: Every day | ORAL | 11 refills | Status: DC
Start: 2023-01-30 — End: 2023-05-18

## 2023-01-30 NOTE — Telephone Encounter (Signed)
Chart reviewed. Zytiga refilled per last office note with Dr. Katragadda 

## 2023-01-31 ENCOUNTER — Other Ambulatory Visit: Payer: Self-pay | Admitting: *Deleted

## 2023-02-09 ENCOUNTER — Other Ambulatory Visit: Payer: Self-pay | Admitting: *Deleted

## 2023-02-24 IMAGING — MR MR LUMBAR SPINE W/O CM
4 of 5 series · 29 of 48 positions shown · non-contrast
Comparison: Radiographs of the lumbar spine 01/14/2020.

CLINICAL DATA: Lumbar degenerative disc disease. Intervertebral
disc degeneration. Additional history provided by scanning
technologist: Patient reports low back pain for 6 months.

EXAM:
MRI LUMBAR SPINE WITHOUT CONTRAST
TECHNIQUE: Multiplanar, multisequence MR imaging of the lumbar spine was
performed. No intravenous contrast was administered.

[Series 5: T2 · sagittal · 4.0mm · 0.68mm/px · 6 of 16 slices shown (1 of 2)]
[im 1/16]
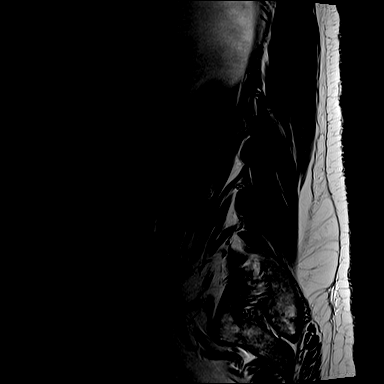
[im 4/16]
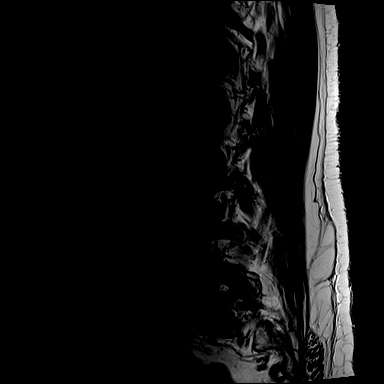
[im 7/16]
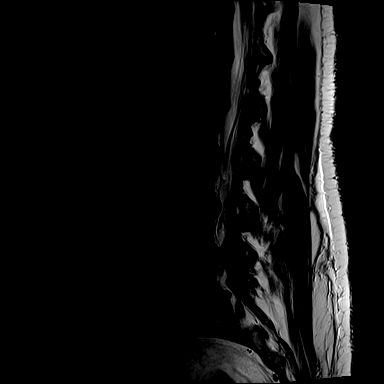
[im 10/16]
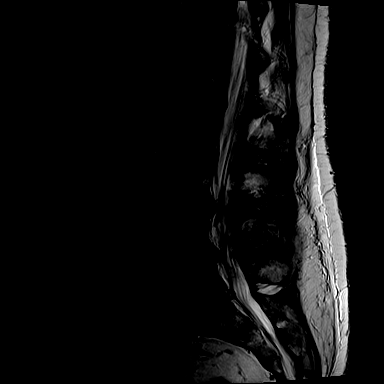
[im 13/16]
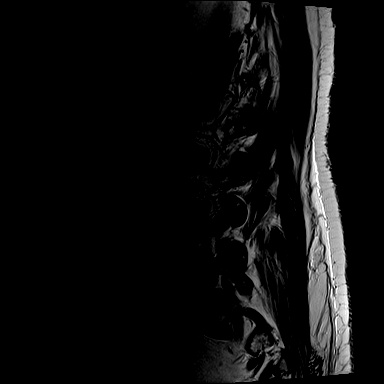
[im 16/16]
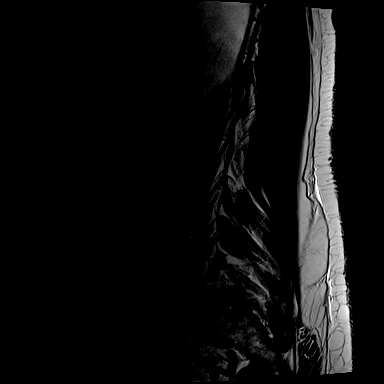

[Series 6: T1 · sagittal · 4.0mm · 0.81mm/px · 7 of 15 slices shown (1 of 2)]
[im 1/15]
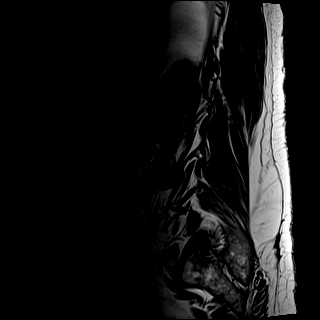
[im 3/15]
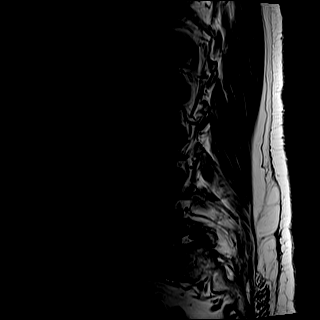
[im 5/15]
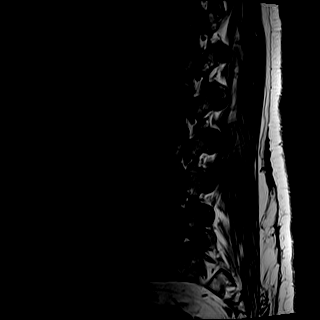
[im 8/15]
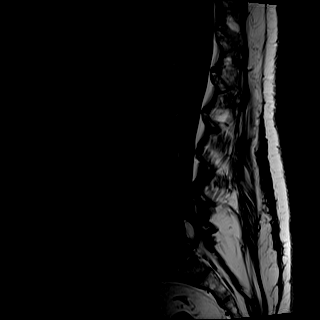
[im 10/15]
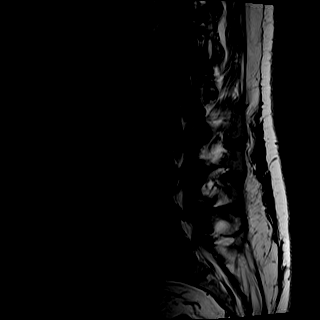
[im 12/15]
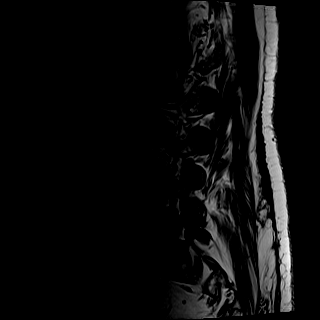
[im 15/15]
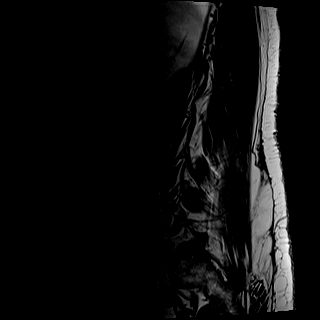

[Series 8: T2 · axial · 4.0mm · 0.70mm/px · z∈[-122,+49]mm · 8 of 30 slices shown (2 of 2)]
[im 1/30]
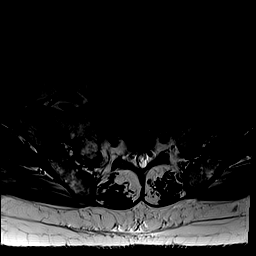
[im 5/30]
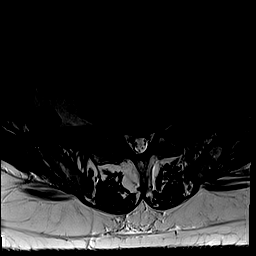
[im 9/30]
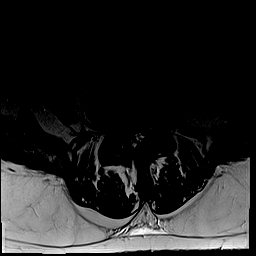
[im 14/30]
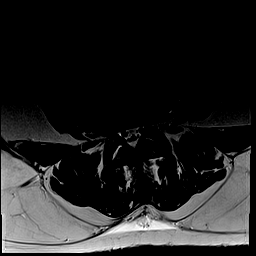
[im 16/30]
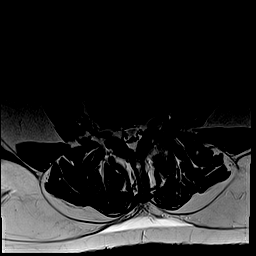
[im 21/30]
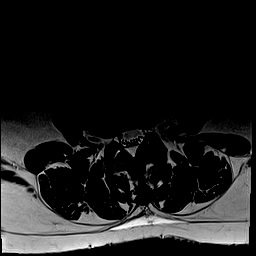
[im 25/30]
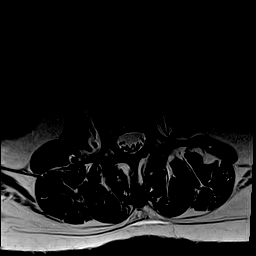
[im 30/30]
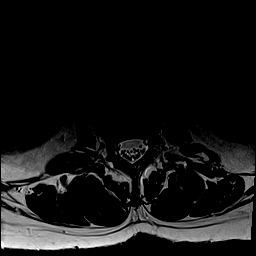

[Series 9: T1 · axial · 4.0mm · 0.35mm/px · z∈[-122,+49]mm · 8 of 30 slices shown (2 of 2)]
[im 1/30]
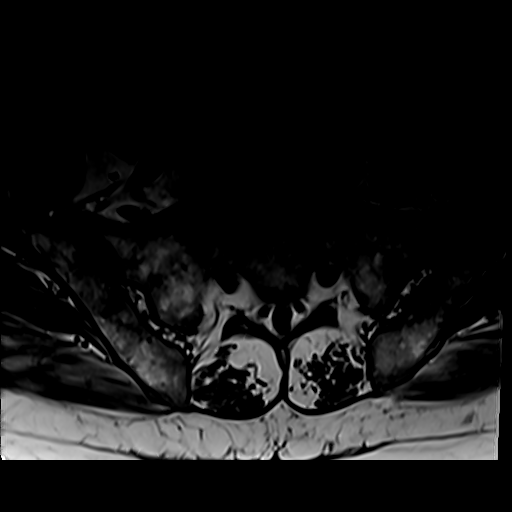
[im 5/30]
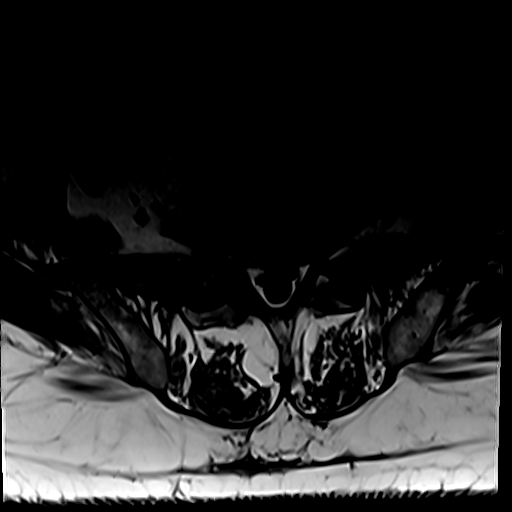
[im 9/30]
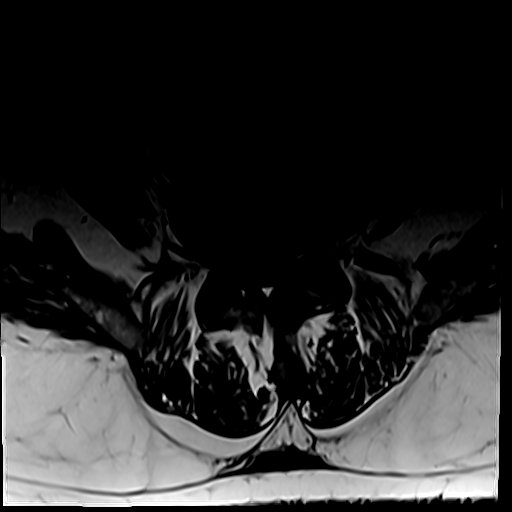
[im 14/30]
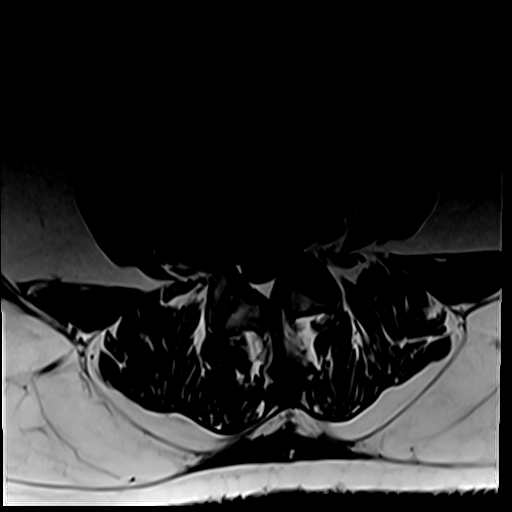
[im 16/30]
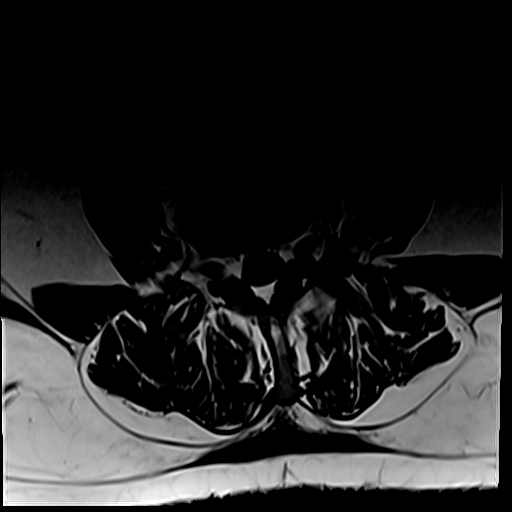
[im 21/30]
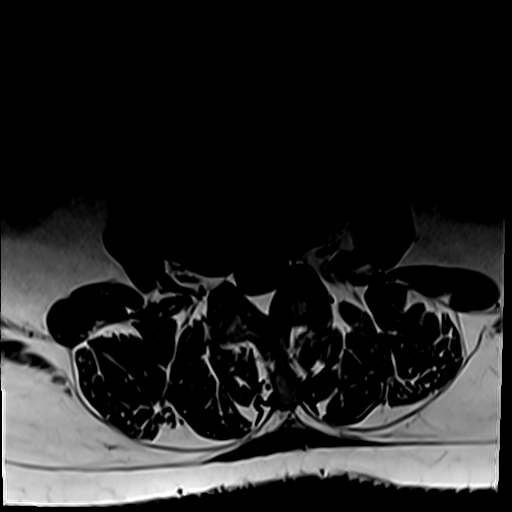
[im 25/30]
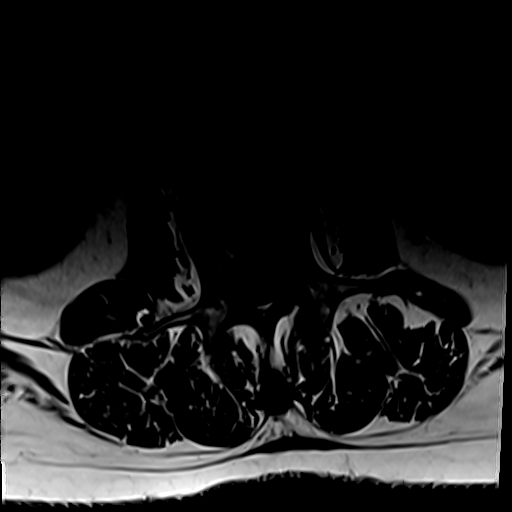
[im 30/30]
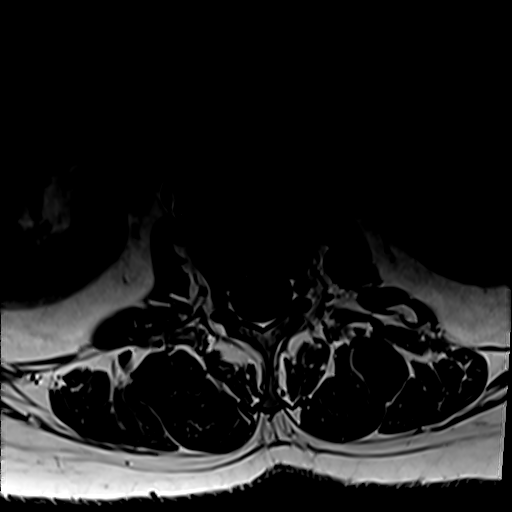

[29 of 48 positions shown; findings below may reference images not displayed]

FINDINGS: Segmentation: The lowest well-formed intervertebral disc space is
designated L5-S1.

Alignment: Lumbar dextrocurvature. Mild grade 1 retrolisthesis at
L1-L2, L2-L3, L3-L4, L4-L5 and L5-S1.

Vertebrae: Vertebral body height is maintained. Mild scattered
degenerative edema within the posterior elements. Trace degenerative
edema along the L4 superior endplate anteriorly. Small L3 vertebral
body hemangioma. Multilevel ventrolateral osteophytes.

Conus medullaris and cauda equina: Conus extends to the T12-L1
level. No signal abnormality within the visualized distal spinal
cord.

Paraspinal and other soft tissues: No abnormality identified within
included portions of the abdomen/retroperitoneum. Paraspinal soft
tissues within normal limits.

Disc levels:

Multilevel disc degeneration. Most notably, there is moderate disc
degeneration at L4-L5 and advanced disc degeneration at L5-S1.

T12-L1: Imaged sagittally. No significant disc herniation or
stenosis.

L1-L2: Grade 1 retrolisthesis. Disc bulge. Superimposed broad-based
right subarticular/foraminal disc protrusion. Mild facet arthrosis.
Mild right greater than left subarticular narrowing without nerve
root impingement. Central canal patent. No significant foraminal
stenosis.

L2-L3: Grade 1 retrolisthesis. Disc bulge. Superimposed broad-based
right subarticular/foraminal disc protrusion. Mild facet
arthrosis/ligamentum flavum hypertrophy. Trace right facet joint
effusion. Minimal right subarticular narrowing without appreciable
nerve root impingement. Central canal patent. Borderline mild right
inferior neural foraminal narrowing.

L3-L4: Grade 1 retrolisthesis. Disc bulge. Superimposed broad-based
right foraminal disc protrusion. Mild facet arthrosis/ligamentum
flavum hypertrophy. Small right facet joint effusion. Mild bilateral
subarticular and central canal narrowing without nerve root
impingement. Mild bilateral neural foraminal narrowing (greater on
the right).

L4-L5: Grade 1 retrolisthesis. Disc bulge with endplate spurring.
Superimposed broad-based right center to right foraminal disc
protrusion with associated osteophyte ridge. Moderate facet
arthrosis and ligamentum flavum hypertrophy. The disc protrusion
contributes to severe right subarticular stenosis, encroaching upon
multiple descending right-sided nerve roots, most notably the
descending right L5 nerve root. Mild left subarticular narrowing.
Mild/moderate central canal narrowing. Bilateral neural foraminal
narrowing (mild/moderate right, mild left).

L5-S1: Grade 1 retrolisthesis. Disc bulge with endplate spurring.
Superimposed shallow left center/subarticular disc protrusion with
associated osteophyte ridge. Mild facet arthrosis/ligamentum flavum
hypertrophy. The disc protrusion and associated osteophyte ridge
contribute to mild left subarticular narrowing, contacting the
descending left S1 nerve root (series 8, image 26). Central canal
patent. Mild/moderate right neural foraminal narrowing. Also of
note, epidural lipomatosis mildly narrows the thecal sac at the L5
vertebral body level.
IMPRESSION: Lumbar spondylosis as outlined and with findings most notably as
follows.

At L4-L5, there is moderate disc degeneration. A broad-based right
center to right foraminal disc protrusion with associated osteophyte
ridge contributes to severe right subarticular stenosis, encroaching
upon multiple descending right-sided nerve roots (most notably the
descending right L5 nerve root). Multifactorial mild left
subarticular and mild/moderate central canal narrowing. Bilateral
neural foraminal narrowing (mild/moderate right, mild left).

At L5-S1, there is advanced disc degeneration. A small left center
disc protrusion with associated osteophyte ridge contributes to mild
left subarticular narrowing and contacts the descending left S1
nerve root. Multifactorial mild/moderate right neural foraminal
narrowing.

No more than mild spinal canal or neural foraminal narrowing at the
remaining levels.

## 2023-04-20 ENCOUNTER — Inpatient Hospital Stay: Payer: Medicaid Other | Attending: Hematology

## 2023-04-20 DIAGNOSIS — C772 Secondary and unspecified malignant neoplasm of intra-abdominal lymph nodes: Secondary | ICD-10-CM | POA: Insufficient documentation

## 2023-04-20 DIAGNOSIS — C61 Malignant neoplasm of prostate: Secondary | ICD-10-CM | POA: Insufficient documentation

## 2023-04-20 LAB — CBC WITH DIFFERENTIAL/PLATELET
Abs Immature Granulocytes: 0.15 10*3/uL — ABNORMAL HIGH (ref 0.00–0.07)
Basophils Absolute: 0.1 10*3/uL (ref 0.0–0.1)
Basophils Relative: 0 %
Eosinophils Absolute: 0.1 10*3/uL (ref 0.0–0.5)
Eosinophils Relative: 1 %
HCT: 40 % (ref 39.0–52.0)
Hemoglobin: 13.1 g/dL (ref 13.0–17.0)
Immature Granulocytes: 1 %
Lymphocytes Relative: 13 %
Lymphs Abs: 1.6 10*3/uL (ref 0.7–4.0)
MCH: 32.6 pg (ref 26.0–34.0)
MCHC: 32.8 g/dL (ref 30.0–36.0)
MCV: 99.5 fL (ref 80.0–100.0)
Monocytes Absolute: 1.1 10*3/uL — ABNORMAL HIGH (ref 0.1–1.0)
Monocytes Relative: 9 %
Neutro Abs: 9.7 10*3/uL — ABNORMAL HIGH (ref 1.7–7.7)
Neutrophils Relative %: 76 %
Platelets: 271 10*3/uL (ref 150–400)
RBC: 4.02 MIL/uL — ABNORMAL LOW (ref 4.22–5.81)
RDW: 12.9 % (ref 11.5–15.5)
WBC: 12.7 10*3/uL — ABNORMAL HIGH (ref 4.0–10.5)
nRBC: 0 % (ref 0.0–0.2)

## 2023-04-20 LAB — COMPREHENSIVE METABOLIC PANEL
ALT: 9 U/L (ref 0–44)
AST: 15 U/L (ref 15–41)
Albumin: 3.7 g/dL (ref 3.5–5.0)
Alkaline Phosphatase: 76 U/L (ref 38–126)
Anion gap: 8 (ref 5–15)
BUN: 20 mg/dL (ref 8–23)
CO2: 26 mmol/L (ref 22–32)
Calcium: 9.3 mg/dL (ref 8.9–10.3)
Chloride: 105 mmol/L (ref 98–111)
Creatinine, Ser: 0.84 mg/dL (ref 0.61–1.24)
GFR, Estimated: >60 mL/min (ref >60)
Glucose, Bld: 102 mg/dL — ABNORMAL HIGH (ref 70–99)
Potassium: 3.5 mmol/L (ref 3.5–5.1)
Sodium: 139 mmol/L (ref 135–145)
Total Bilirubin: 0.7 mg/dL (ref 0.3–1.2)
Total Protein: 7 g/dL (ref 6.5–8.1)

## 2023-04-20 LAB — PSA: Prostatic Specific Antigen: 0.04 ng/mL (ref 0.00–4.00)

## 2023-04-21 ENCOUNTER — Inpatient Hospital Stay: Payer: Medicaid Other

## 2023-04-21 ENCOUNTER — Ambulatory Visit: Payer: Medicaid Other

## 2023-04-24 NOTE — Progress Notes (Signed)
[facility] [address] [place] [address]    Clinic Day:  04/24/2023  Referring physician: Ignatius Specking, MD  Patient Care Team: Ignatius Specking, MD as PCP - General (Internal Medicine) Jonelle Sidle, MD as PCP - Cardiology (Cardiology) Doreatha Massed, MD as Medical Oncologist (Medical Oncology)   ASSESSMENT & PLAN:   Assessment: 1.  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  Serum testosterone on 12/17/2019 of 197. -Bone scan on 12/18/2019 at [facility] shows small focus of uptake seen left inferior orbit favoring benign etiology.  No suspicious foci of bone meta stasis. -CTAP with contrast on 12/18/2019 showed retroperitoneal and bilateral iliac chain metastatic adenopathy.  Left para-aortic lymph node measures 15 mm.  Right external iliac lymph node measures 22 x 35 mm.  Left external iliac lymph node measures 26 x 35 mm.  Ill-defined hypoenhancing 12 mm hepatic lesion, indeterminate, however concerning for metastasis.  Sclerotic focus in the right posterior ilium, indeterminate. -MRI of the abdomen on 01/10/2020 shows tiny 8 mm low-attenuation lesion in the posterior right hepatic lobe, too small to characterize.  No other significant abnormalities. -Abiraterone and prednisone started around 02/01/2020.  Degarelix started on 01/29/2020. -Lupron 45 mg on 02/26/2020.   2.  Family history: -Father died of prostate cancer at age 32.  Sister had cancer on her leg resected.  Paternal uncle had cancer, patient does not know the type. - Germline mutation testing was negative.    Plan: 1.  Metastatic castration sensitive prostate cancer: - He is tolerating Abiraterone and prednisone very well.  He has occasional hot flashes which are tolerable. - Reviewed labs from 01/11/2023: Normal LFTs and creatinine.  CBC grossly normal.  PSA is  improved to 0.03. - Blood pressure is stable at 118/86.  Last Eligard on 10/12/2022. - Continue Abiraterone 1000 mg daily with prednisone 5 mg daily. - RTC 3 months for follow-up with repeat PSA and labs.   2.  Hypokalemia: - He had severe hypokalemia with potassium 2.9 on 01/11/2023. - We have given him 40 mEq of K-Dur.  We repeated potassium today which is 4.3. - Etiology includes side effect from Abiraterone. - Will check potassium next week and follow-up on it.  If it continues to be low, will start him on potassium 20 mill equivalents daily.   3.  Constipation: - Continue stool softeners and milk of magnesia.   4.  Osteopenia (DEXA scan 07/15/2021 T score -1.7): - Last Prolia on 10/12/2022. - Calcium is 8.8.  Continue calcium supplements.    No orders of the defined types were placed in this encounter.    Alben Deeds Teague,acting as a Neurosurgeon for Doreatha Massed, MD.,have documented all relevant documentation on the behalf of Doreatha Massed, MD,as directed by  Doreatha Massed, MD while in the presence of Doreatha Massed, MD.  ***   Florence R Teague   9/30/20249:17 PM  CHIEF COMPLAINT:   Diagnosis: metastatic castration sensitive prostate cancer to the retroperitoneal lymph nodes    Cancer Staging  No matching staging information was found for the patient.    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 The report date is 03/15/2020.   The Common Hereditary Cancers Panel offered by [facility] includes sequencing and/or deletion duplication testing of the following 47 genes:  APC, ATM, AXIN2, BARD1, BMPR1A, BRCA1, BRCA2, BRIP1, CDH1, CDK4, CDKN2A (p14ARF), CDKN2A (p16INK4a), CHEK2, CTNNA1, DICER1, EPCAM (Deletion/duplication testing only), GREM1 (promoter region deletion/duplication testing only), KIT, MEN1, MLH1, MSH2, MSH3, MSH6, MUTYH, NBN, NF1, NTHL1, PALB2, PDGFRA, PMS2, POLD1, POLE, PTEN, RAD50, RAD51C, RAD51D, SDHB, SDHC, SDHD, SMAD4, SMARCA4. STK11, TP53, TSC1, TSC2, and VHL.  The following genes were evaluated for sequence changes only: SDHA and HOXB13 c.251G>A variant only. The Prostate Cancer HRR Panel offered by [facility] includes sequencing and/or deletion/duplication analysis of the following 10 genes: ATM, BARD1, BRCA1, BRCA2, BRIP1, CHEK2, FANCL, PALB2, RAD51C, RAD51D.      INTERVAL HISTORY:   Ashely is a 62 y.o. male presenting to clinic today for follow up of metastatic castration sensitive prostate cancer to the retroperitoneal lymph nodes. He was last seen by me on 01/18/23.  Today, he states that he is doing well overall. His appetite level is at ***%. His energy level is at ***%.  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)     Surgical History: Past Surgical History:  Procedure Laterality Date   FOREIGN BODY REMOVAL N/A 07/13/2017   Procedure: FOREIGN BODY REMOVAL ADULT;  Surgeon: Graylin Shiver, MD;  Location: [facility] OR;  Service: ENT;  Laterality: N/A;   LEG SURGERY     rods in leg from fracture   RIGID ESOPHAGOSCOPY N/A 07/13/2017   Procedure: RIGID ESOPHAGOSCOPY;  Surgeon: Graylin Shiver, MD;  Location: [facility] OR;  Service: ENT;  Laterality: N/A;    Social History: Social History   Socioeconomic History   Marital status: Single    Spouse name: Not on file   Number of children: 0    Years of education: Not on file   Highest education level: Not on file  Occupational History   Occupation: EMPLOYED  Tobacco Use   Smoking status: Never   Smokeless tobacco: Never  Vaping Use   Vaping status: Never Used  Substance and Sexual Activity   Alcohol use: No   Drug use: No   Sexual activity: Not on file  Other Topics Concern   Not on file  Social History Narrative   Not on file   Social Determinants of Health   Financial Resource Strain: Medium Risk (03/15/2021)   Overall Financial Resource Strain (CARDIA)    Difficulty of Paying Living Expenses: Somewhat hard  Food Insecurity: No Food Insecurity (03/15/2021)   Hunger Vital Sign    Worried About Running Out of Food in the Last Year: Never true    Ran Out of Food in the Last Year: Never true  Transportation Needs: No Transportation Needs (03/15/2021)   PRAPARE - Administrator, Civil Service (Medical): No    Lack of Transportation (Non-Medical): No  Physical Activity: Inactive (03/15/2021)   Exercise Vital Sign    Days of Exercise per Week: 0 days    Minutes of Exercise per Session: 0 min  Stress:  Stress Concern Present (03/15/2021)   [employer] of Occupational Health - Occupational Stress Questionnaire    Feeling of Stress : Rather much  Social Connections: Socially Isolated (03/15/2021)   Social Connection and Isolation Panel [NHANES]    Frequency of Communication with Friends and Family: Never    Frequency of Social Gatherings with Friends and Family: Never    Attends Religious Services: Never    Database administrator or Organizations: No    Attends Banker Meetings: Never    Marital Status: Divorced  Catering manager Violence: Not At Risk (03/15/2021)   Humiliation, Afraid, Rape, and Kick questionnaire    Fear of Current or Ex-Partner: No    Emotionally Abused: No    Physically Abused: No    Sexually Abused: No    Family History: Family History  Problem Relation Age of  Onset   Dementia Mother    Prostate cancer Father 1       metastatic   Cancer Sister        cancer on leg, dx. in her 7s   Lymphoma Brother 85   Lung cancer Paternal Aunt        dx. >50    Current Medications:  Current Outpatient Medications:    potassium chloride (KLOR-CON) 10 MEQ tablet, Take 1 tablet (10 mEq total) by mouth daily., Disp: 30 tablet, Rfl: 6   abiraterone acetate (ZYTIGA) 250 MG tablet, Take 4 tablets (1,000 mg total) by mouth daily. Take on an empty stomach 1 hour before or 2 hours after a meal, Disp: 120 tablet, Rfl: 11   citalopram (CELEXA) 20 MG tablet, Take 20 mg by mouth daily., Disp: , Rfl:    HYDROcodone-acetaminophen (NORCO/VICODIN) 5-325 MG tablet, Take 1 tablet by mouth every 4 (four) hours as needed., Disp: , Rfl:    ondansetron (ZOFRAN) 4 MG tablet, Take 4 mg by mouth 3 (three) times daily as needed for nausea., Disp: , Rfl:    predniSONE (DELTASONE) 5 MG tablet, TAKE 1 TABLET(5 MG) BY MOUTH TWICE DAILY WITH A MEAL, Disp: 60 tablet, Rfl: 11   prochlorperazine (COMPAZINE) 10 MG tablet, Take 1 tablet (10 mg total) by mouth every 6 (six) hours as needed for nausea or vomiting. (Patient not taking: Reported on 09/14/2022), Disp: 60 tablet, Rfl: 2   Allergies: No Known Allergies  REVIEW OF SYSTEMS:   Review of Systems  Constitutional:  Negative for chills, fatigue and fever.  HENT:   Negative for lump/mass, mouth sores, nosebleeds, sore throat and trouble swallowing.   Eyes:  Negative for eye problems.  Respiratory:  Negative for cough and shortness of breath.   Cardiovascular:  Negative for chest pain, leg swelling and palpitations.  Gastrointestinal:  Negative for abdominal pain, constipation, diarrhea, nausea and vomiting.  Genitourinary:  Negative for bladder incontinence, difficulty urinating, dysuria, frequency, hematuria and nocturia.   Musculoskeletal:  Negative for arthralgias, back pain, flank pain, myalgias and neck pain.  Skin:  Negative for  itching and rash.  Neurological:  Negative for dizziness, headaches and numbness.  Hematological:  Does not bruise/bleed easily.  Psychiatric/Behavioral:  Negative for depression, sleep disturbance and suicidal ideas. The patient is not nervous/anxious.   All other systems reviewed and are negative.    VITALS:   There were no vitals taken for this visit.  Wt Readings from Last 3 Encounters:  01/18/23 230 lb (104.3 kg)  10/12/22 230 lb 1.6 oz (104.4 kg)  09/14/22 233 lb (105.7 kg)  There is no height or weight on file to calculate BMI.  Performance status (ECOG): 1 - Symptomatic but completely ambulatory  PHYSICAL EXAM:   Physical Exam Vitals and nursing note reviewed. Exam conducted with a chaperone present.  Constitutional:      Appearance: Normal appearance.  Cardiovascular:     Rate and Rhythm: Normal rate and regular rhythm.     Pulses: Normal pulses.     Heart sounds: Normal heart sounds.  Pulmonary:     Effort: Pulmonary effort is normal.     Breath sounds: Normal breath sounds.  Abdominal:     Palpations: Abdomen is soft. There is no hepatomegaly, splenomegaly or mass.     Tenderness: There is no abdominal tenderness.  Musculoskeletal:     Right lower leg: No edema.     Left lower leg: No edema.  Lymphadenopathy:     Cervical: No cervical adenopathy.     Right cervical: No superficial, deep or posterior cervical adenopathy.    Left cervical: No superficial, deep or posterior cervical adenopathy.     Upper Body:     Right upper body: No supraclavicular or axillary adenopathy.     Left upper body: No supraclavicular or axillary adenopathy.  Neurological:     General: No focal deficit present.     Mental Status: He is alert and oriented to person, place, and time.  Psychiatric:        Mood and Affect: Mood normal.        Behavior: Behavior normal.     LABS:      Latest Ref Rng & Units 04/20/2023    2:28 PM 01/11/2023    2:30 PM 10/05/2022   10:48 AM   CBC  WBC 4.0 - 10.5 K/uL 12.7  12.4  12.3   Hemoglobin 13.0 - 17.0 g/dL 08.6  57.8  46.9   Hematocrit 39.0 - 52.0 % 40.0  40.5  39.4   Platelets 150 - 400 K/uL 271  273  290       Latest Ref Rng & Units 04/20/2023    2:28 PM 01/24/2023    7:58 AM 01/18/2023    2:17 PM  CMP  Glucose 70 - 99 mg/dL 629     BUN 8 - 23 mg/dL 20     Creatinine 5.28 - 1.24 mg/dL 4.13     Sodium 244 - 010 mmol/L 139     Potassium 3.5 - 5.1 mmol/L 3.5  3.6  4.3   Chloride 98 - 111 mmol/L 105     CO2 22 - 32 mmol/L 26     Calcium 8.9 - 10.3 mg/dL 9.3     Total Protein 6.5 - 8.1 g/dL 7.0     Total Bilirubin 0.3 - 1.2 mg/dL 0.7     Alkaline Phos 38 - 126 U/L 76     AST 15 - 41 U/L 15     ALT 0 - 44 U/L 9        No results found for: "CEA1", "CEA" / No results found for: "CEA1", "CEA" No results found for: "PSA1" No results found for: "UVO536" No results found for: "CAN125"  No results found for: "TOTALPROTELP", "ALBUMINELP", "A1GS", "A2GS", "BETS", "BETA2SER", "GAMS", "MSPIKE", "SPEI" No results found for: "TIBC", "FERRITIN", "IRONPCTSAT" Lab Results  Component Value Date   LDH 167 04/20/2020   LDH 145 02/17/2020     STUDIES:   No results found.

## 2023-04-25 ENCOUNTER — Ambulatory Visit: Payer: Medicaid Other

## 2023-04-25 ENCOUNTER — Inpatient Hospital Stay: Payer: Medicaid Other

## 2023-04-25 ENCOUNTER — Inpatient Hospital Stay: Payer: Medicaid Other | Attending: Hematology | Admitting: Hematology

## 2023-04-25 VITALS — BP 122/86 | HR 97 | Temp 98.4°F | Resp 18 | Wt 242.0 lb

## 2023-04-25 DIAGNOSIS — R232 Flushing: Secondary | ICD-10-CM | POA: Insufficient documentation

## 2023-04-25 DIAGNOSIS — M2550 Pain in unspecified joint: Secondary | ICD-10-CM | POA: Diagnosis not present

## 2023-04-25 DIAGNOSIS — M8580 Other specified disorders of bone density and structure, unspecified site: Secondary | ICD-10-CM | POA: Diagnosis not present

## 2023-04-25 DIAGNOSIS — K5900 Constipation, unspecified: Secondary | ICD-10-CM | POA: Diagnosis not present

## 2023-04-25 DIAGNOSIS — Z191 Hormone sensitive malignancy status: Secondary | ICD-10-CM | POA: Diagnosis not present

## 2023-04-25 DIAGNOSIS — Z8042 Family history of malignant neoplasm of prostate: Secondary | ICD-10-CM | POA: Insufficient documentation

## 2023-04-25 DIAGNOSIS — Z7952 Long term (current) use of systemic steroids: Secondary | ICD-10-CM | POA: Diagnosis not present

## 2023-04-25 DIAGNOSIS — E876 Hypokalemia: Secondary | ICD-10-CM | POA: Insufficient documentation

## 2023-04-25 DIAGNOSIS — C61 Malignant neoplasm of prostate: Secondary | ICD-10-CM | POA: Insufficient documentation

## 2023-04-25 DIAGNOSIS — C778 Secondary and unspecified malignant neoplasm of lymph nodes of multiple regions: Secondary | ICD-10-CM | POA: Diagnosis not present

## 2023-04-25 DIAGNOSIS — Z809 Family history of malignant neoplasm, unspecified: Secondary | ICD-10-CM | POA: Diagnosis not present

## 2023-04-25 DIAGNOSIS — Z5111 Encounter for antineoplastic chemotherapy: Secondary | ICD-10-CM | POA: Diagnosis present

## 2023-04-25 DIAGNOSIS — M25473 Effusion, unspecified ankle: Secondary | ICD-10-CM | POA: Insufficient documentation

## 2023-04-25 DIAGNOSIS — Z79818 Long term (current) use of other agents affecting estrogen receptors and estrogen levels: Secondary | ICD-10-CM | POA: Diagnosis not present

## 2023-04-25 DIAGNOSIS — Z79899 Other long term (current) drug therapy: Secondary | ICD-10-CM | POA: Diagnosis not present

## 2023-04-25 DIAGNOSIS — C772 Secondary and unspecified malignant neoplasm of intra-abdominal lymph nodes: Secondary | ICD-10-CM | POA: Diagnosis not present

## 2023-04-25 MED ORDER — FUROSEMIDE 20 MG PO TABS: 20.0000 mg | ORAL_TABLET | Freq: Every day | ORAL | 2 refills | Status: DC | PRN

## 2023-04-25 MED ORDER — DENOSUMAB 60 MG/ML ~~LOC~~ SOSY
60.0000 mg | PREFILLED_SYRINGE | Freq: Once | SUBCUTANEOUS | Status: AC
  Administered 2023-04-25: 60 mg via SUBCUTANEOUS
  Filled 2023-04-25: qty 1

## 2023-04-25 MED ORDER — SODIUM CHLORIDE 0.9 % IV SOLN: Freq: Once | INTRAVENOUS | Status: DC

## 2023-04-25 MED ORDER — LEUPROLIDE ACETATE (6 MONTH) 45 MG ~~LOC~~ KIT
45.0000 mg | PACK | Freq: Once | SUBCUTANEOUS | Status: AC
  Administered 2023-04-25: 45 mg via SUBCUTANEOUS
  Filled 2023-04-25: qty 45

## 2023-04-25 NOTE — Progress Notes (Signed)
Patient is taking Zytiga as prescribed.  He has not missed any doses and reports no side effects at this time.   

## 2023-04-25 NOTE — Patient Instructions (Signed)
[facility]  Discharge Instructions: Thank you for choosing [facility] to provide your oncology and hematology care.  If you have a lab appointment with the Cancer Center - please note that after April 8th, 2024, all labs will be drawn in the cancer center.  You do not have to check in or register with the main entrance as you have in the past but will complete your check-in in the cancer center.  Wear comfortable clothing and clothing appropriate for easy access to any Portacath or PICC line.   We strive to give you quality time with your provider. You may need to reschedule your appointment if you arrive late (15 or more minutes).  Arriving late affects you and other patients whose appointments are after yours.  Also, if you miss three or more appointments without notifying the office, you may be dismissed from the clinic at the provider's discretion.      For prescription refill requests, have your pharmacy contact our office and allow 72 hours for refills to be completed.    Today you received the following chemotherapy and/or immunotherapy agents Prolia and Eligard injections.  Denosumab Injection (Osteoporosis) What is this medication? DENOSUMAB (den oh SUE mab) prevents and treats osteoporosis. It works by Interior and spatial designer stronger and less likely to break (fracture). It is a monoclonal antibody. This medicine may be used for other purposes; ask your health care provider or pharmacist if you have questions. COMMON BRAND NAME(S): Prolia What should I tell my care team before I take this medication? They need to know if you have any of these conditions: Dental or gum disease Had thyroid or parathyroid (glands located in neck) surgery Having dental surgery or a tooth pulled Kidney disease Low levels of calcium in the blood On dialysis Poor nutrition Thyroid disease Trouble absorbing nutrients from your food An unusual or allergic reaction to  denosumab, other medications, foods, dyes, or preservatives Pregnant or trying to get pregnant Breastfeeding How should I use this medication? This medication is injected under the skin. It is given by your care team in a hospital or clinic setting. A special MedGuide will be given to you before each treatment. Be sure to read this information carefully each time. Talk to your care team about the use of this medication in children. Special care may be needed. Overdosage: If you think you have taken too much of this medicine contact a poison control center or emergency room at once. NOTE: This medicine is only for you. Do not share this medicine with others. What if I miss a dose? Keep appointments for follow-up doses. It is important not to miss your dose. Call your care team if you are unable to keep an appointment. What may interact with this medication? Do not take this medication with any of the following: Other medications that contain denosumab This medication may also interact with the following: Medications that lower your chance of fighting infection Steroid medications, such as prednisone or cortisone This list may not describe all possible interactions. Give your health care provider a list of all the medicines, herbs, non-prescription drugs, or dietary supplements you use. Also tell them if you smoke, drink alcohol, or use illegal drugs. Some items may interact with your medicine. What should I watch for while using this medication? Your condition will be monitored carefully while you are receiving this medication. You may need blood work done while taking this medication. This medication may increase your  risk of getting an infection. Call your care team for advice if you get a fever, chills, sore throat, or other symptoms of a cold or flu. Do not treat yourself. Try to avoid being around people who are sick. Tell your dentist and dental surgeon that you are taking this medication.  You should not have major dental surgery while on this medication. See your dentist to have a dental exam and fix any dental problems before starting this medication. Take good care of your teeth while on this medication. Make sure you see your dentist for regular follow-up appointments. This medication may cause low levels of calcium in your body. The risk of severe side effects is increased in people with kidney disease. Your care team may prescribe calcium and vitamin D to help prevent low calcium levels while you take this medication. It is important to take calcium and vitamin D as directed by your care team. Talk to your care team if you may be pregnant. Serious birth defects may occur if you take this medication during pregnancy and for 5 months after the last dose. You will need a negative pregnancy test before starting this medication. Contraception is recommended while taking this medication and for 5 months after the last dose. Your care team can help you find the option that works for you. Talk to your care team before breastfeeding. Changes to your treatment plan may be needed. What side effects may I notice from receiving this medication? Side effects that you should report to your care team as soon as possible: Allergic reactions--skin rash, itching, hives, swelling of the face, lips, tongue, or throat Infection--fever, chills, cough, sore throat, wounds that don't heal, pain or trouble when passing urine, general feeling of discomfort or being unwell Low calcium level--muscle pain or cramps, confusion, tingling, or numbness in the hands or feet Osteonecrosis of the jaw--pain, swelling, or redness in the mouth, numbness of the jaw, poor healing after dental work, unusual discharge from the mouth, visible bones in the mouth Severe bone, joint, or muscle pain Skin infection--skin redness, swelling, warmth, or pain Side effects that usually do not require medical attention (report these to  your care team if they continue or are bothersome): Back pain Headache Joint pain Muscle pain Pain in the hands, arms, legs, or feet Runny or stuffy nose Sore throat This list may not describe all possible side effects. Call your doctor for medical advice about side effects. You may report side effects to FDA at 1-800-FDA-1088. Where should I keep my medication? This medication is given in a hospital or clinic. It will not be stored at home. NOTE: This sheet is a summary. It may not cover all possible information. If you have questions about this medicine, talk to your doctor, pharmacist, or health care provider.  2024 Elsevier/Gold Standard (2022-08-16 00:00:00) Leuprolide Suspension for Injection (Prostate Cancer) What is this medication? LEUPROLIDE (loo PROE lide) reduces the symptoms of prostate cancer. It works by decreasing levels of the hormone testosterone in the body. This prevents prostate cancer cells from spreading or growing. This medicine may be used for other purposes; ask your health care provider or pharmacist if you have questions. COMMON BRAND NAME(S): Eligard, Lupron Depot, Lutrate Depot What should I tell my care team before I take this medication? They need to know if you have any of these conditions: Diabetes Heart disease Heart failure High or low levels of electrolytes, such as magnesium, potassium, or sodium in your blood Irregular heartbeat  or rhythm Seizures An unusual or allergic reaction to leuprolide, other medications, foods, dyes, or preservatives Pregnant or trying to get pregnant Breast-feeding How should I use this medication? This medication is injected under the skin or into a muscle. It is given by your care team in a hospital or clinic setting. Talk to your care team about the use of this medication in children. Special care may be needed. Overdosage: If you think you have taken too much of this medicine contact a poison control center or  emergency room at once. NOTE: This medicine is only for you. Do not share this medicine with others. What if I miss a dose? Keep appointments for follow-up doses. It is important not to miss your dose. Call your care team if you are unable to keep an appointment. What may interact with this medication? Do not take this medication with any of the following: Cisapride Dronedarone Ketoconazole Levoketoconazole Pimozide Thioridazine This medication may also interact with the following: Other medications that cause heart rhythm changes This list may not describe all possible interactions. Give your health care provider a list of all the medicines, herbs, non-prescription drugs, or dietary supplements you use. Also tell them if you smoke, drink alcohol, or use illegal drugs. Some items may interact with your medicine. What should I watch for while using this medication? Visit your care team for regular checks on your progress. Tell your care team if your symptoms do not start to get better or if they get worse. This medication may increase blood sugar. The risk may be higher in patients who already have diabetes. Ask your care team what you can do to lower the risk of diabetes while taking this medication. This medication may cause infertility. Talk to your care team if you are concerned about your fertility. Heart attacks and strokes have been reported with the use of this medication. Get emergency help if you develop signs or symptoms of a heart attack or stroke. Talk to your care team about the risks and benefits of this medication. What side effects may I notice from receiving this medication? Side effects that you should report to your care team as soon as possible: Allergic reactions--skin rash, itching, hives, swelling of the face, lips, tongue, or throat Heart attack--pain or tightness in the chest, shoulders, arms, or jaw, nausea, shortness of breath, cold or clammy skin, feeling faint or  lightheaded Heart rhythm changes--fast or irregular heartbeat, dizziness, feeling faint or lightheaded, chest pain, trouble breathing High blood sugar (hyperglycemia)--increased thirst or amount of urine, unusual weakness or fatigue, blurry vision New or worsening seizures Redness, blistering, peeling, or loosening of the skin, including inside the mouth Stroke--sudden numbness or weakness of the face, arm, or leg, trouble speaking, confusion, trouble walking, loss of balance or coordination, dizziness, severe headache, change in vision Swelling and pain of the tumor site or lymph nodes Side effects that usually do not require medical attention (report these to your care team if they continue or are bothersome): Change in sex drive or performance Hot flashes Joint pain Pain, redness, or irritation at injection site Swelling of the ankles, hands, or feet Unusual weakness or fatigue This list may not describe all possible side effects. Call your doctor for medical advice about side effects. You may report side effects to FDA at 1-800-FDA-1088. Where should I keep my medication? This medication is given in a hospital or clinic. It will not be stored at home. NOTE: This sheet is a summary. It  may not cover all possible information. If you have questions about this medicine, talk to your doctor, pharmacist, or health care provider.  2024 Elsevier/Gold Standard (2022-11-11 00:00:00)       To help prevent nausea and vomiting after your treatment, we encourage you to take your nausea medication as directed.  BELOW ARE SYMPTOMS THAT SHOULD BE REPORTED IMMEDIATELY: *FEVER GREATER THAN 100.4 F (38 C) OR HIGHER *CHILLS OR SWEATING *NAUSEA AND VOMITING THAT IS NOT CONTROLLED WITH YOUR NAUSEA MEDICATION *UNUSUAL SHORTNESS OF BREATH *UNUSUAL BRUISING OR BLEEDING *URINARY PROBLEMS (pain or burning when urinating, or frequent urination) *BOWEL PROBLEMS (unusual diarrhea, constipation, pain near the  anus) TENDERNESS IN MOUTH AND THROAT WITH OR WITHOUT PRESENCE OF ULCERS (sore throat, sores in mouth, or a toothache) UNUSUAL RASH, SWELLING OR PAIN  UNUSUAL VAGINAL DISCHARGE OR ITCHING   Items with * indicate a potential emergency and should be followed up as soon as possible or go to the Emergency Department if any problems should occur.  Please show the CHEMOTHERAPY ALERT CARD or IMMUNOTHERAPY ALERT CARD at check-in to the Emergency Department and triage nurse.  Should you have questions after your visit or need to cancel or reschedule your appointment, please contact [facility] AT [facility] [phone]  and follow the prompts.  Office hours are 8:00 a.m. to 4:30 p.m. Monday - Friday. Please note that voicemails left after 4:00 p.m. may not be returned until the following business day.  We are closed weekends and major holidays. You have access to a nurse at all times for urgent questions. Please call the main number to the clinic [phone] and follow the prompts.  For any non-urgent questions, you may also contact your provider using MyChart. We now offer e-Visits for anyone 51 and older to request care online for non-urgent symptoms. For details visit [email or web address].   Also download the MyChart app! Go to the app store, search "MyChart", open the app, select [facility], and log in with your MyChart username and password.

## 2023-04-25 NOTE — Progress Notes (Signed)
Randall Dawson presents today for injection per the provider's orders.  Eligard and Prolia administration without incident; injection site WNL; see MAR for injection details.  Patient tolerated procedure well and without incident.  No questions or complaints noted at this time. Discharged from clinic ambulatory in stable condition. Alert and oriented x 3. F/U with [facility] as scheduled.

## 2023-04-25 NOTE — Patient Instructions (Signed)
[facility] at [facility] Discharge Instructions   You were seen and examined today by Dr. Ellin Saba.  He reviewed the results of your lab work which are normal/stable.   Continue Zytiga as prescribed.   We sent a prescription for a fluid pill called Lasix. You may take one pill daily in the morning, only as needed for ankle swelling.   We will proceed with your Eligard and Prolia injections today.   We will see you back in 3 months. We will repeat lab work prior to this visit.   Return as scheduled.    Thank you for choosing [facility] at [facility] to provide your oncology and hematology care.  To afford each patient quality time with our provider, please arrive at least 15 minutes before your scheduled appointment time.   If you have a lab appointment with the Cancer Center please come in thru the Main Entrance and check in at the main information desk.  You need to re-schedule your appointment should you arrive 10 or more minutes late.  We strive to give you quality time with our providers, and arriving late affects you and other patients whose appointments are after yours.  Also, if you no show three or more times for appointments you may be dismissed from the clinic at the providers discretion.     Again, thank you for choosing [facility].  Our hope is that these requests will decrease the amount of time that you wait before being seen by our physicians.       _____________________________________________________________  Should you have questions after your visit to [facility], please contact our office at [phone] and follow the prompts.  Our office hours are 8:00 a.m. and 4:30 p.m. Monday - Friday.  Please note that voicemails left after 4:00 p.m. may not be returned until the following business day.  We are closed weekends and major holidays.  You do have access to a nurse 24-7, just call the main  number to the clinic [phone] and do not press any options, hold on the line and a nurse will answer the phone.    For prescription refill requests, have your pharmacy contact our office and allow 72 hours.    Due to Covid, you will need to wear a mask upon entering the hospital. If you do not have a mask, a mask will be given to you at the Main Entrance upon arrival. For doctor visits, patients may have 1 support person age 49 or older with them. For treatment visits, patients can not have anyone with them due to social distancing guidelines and our immunocompromised population.

## 2023-05-18 ENCOUNTER — Other Ambulatory Visit: Payer: Self-pay | Admitting: *Deleted

## 2023-05-18 DIAGNOSIS — C772 Secondary and unspecified malignant neoplasm of intra-abdominal lymph nodes: Secondary | ICD-10-CM

## 2023-05-18 MED ORDER — ABIRATERONE ACETATE 250 MG PO TABS: 1000.0000 mg | ORAL_TABLET | Freq: Every day | ORAL | 11 refills | Status: DC

## 2023-05-18 NOTE — Telephone Encounter (Signed)
Zytiga refill approved.  Patient is tolerating and is to continue therapy. 

## 2023-07-31 ENCOUNTER — Other Ambulatory Visit: Payer: Self-pay | Admitting: Hematology

## 2023-08-01 ENCOUNTER — Inpatient Hospital Stay: Payer: Medicaid Other | Attending: Hematology

## 2023-08-01 ENCOUNTER — Encounter (HOSPITAL_COMMUNITY): Payer: Self-pay | Admitting: Hematology

## 2023-08-01 DIAGNOSIS — Z7952 Long term (current) use of systemic steroids: Secondary | ICD-10-CM | POA: Diagnosis not present

## 2023-08-01 DIAGNOSIS — Z8042 Family history of malignant neoplasm of prostate: Secondary | ICD-10-CM | POA: Diagnosis not present

## 2023-08-01 DIAGNOSIS — C61 Malignant neoplasm of prostate: Secondary | ICD-10-CM | POA: Diagnosis present

## 2023-08-01 DIAGNOSIS — C772 Secondary and unspecified malignant neoplasm of intra-abdominal lymph nodes: Secondary | ICD-10-CM | POA: Insufficient documentation

## 2023-08-01 DIAGNOSIS — Z807 Family history of other malignant neoplasms of lymphoid, hematopoietic and related tissues: Secondary | ICD-10-CM | POA: Diagnosis not present

## 2023-08-01 DIAGNOSIS — R200 Anesthesia of skin: Secondary | ICD-10-CM | POA: Diagnosis not present

## 2023-08-01 DIAGNOSIS — Z801 Family history of malignant neoplasm of trachea, bronchus and lung: Secondary | ICD-10-CM | POA: Diagnosis not present

## 2023-08-01 DIAGNOSIS — M8580 Other specified disorders of bone density and structure, unspecified site: Secondary | ICD-10-CM | POA: Diagnosis not present

## 2023-08-01 DIAGNOSIS — R609 Edema, unspecified: Secondary | ICD-10-CM | POA: Insufficient documentation

## 2023-08-01 DIAGNOSIS — E876 Hypokalemia: Secondary | ICD-10-CM | POA: Insufficient documentation

## 2023-08-01 LAB — COMPREHENSIVE METABOLIC PANEL
ALT: 9 U/L (ref 0–44)
AST: 14 U/L — ABNORMAL LOW (ref 15–41)
Albumin: 3.8 g/dL (ref 3.5–5.0)
Alkaline Phosphatase: 66 U/L (ref 38–126)
Anion gap: 8 (ref 5–15)
BUN: 15 mg/dL (ref 8–23)
CO2: 26 mmol/L (ref 22–32)
Calcium: 9.6 mg/dL (ref 8.9–10.3)
Chloride: 106 mmol/L (ref 98–111)
Creatinine, Ser: 0.95 mg/dL (ref 0.61–1.24)
GFR, Estimated: >60 mL/min (ref >60)
Glucose, Bld: 97 mg/dL (ref 70–99)
Potassium: 3.8 mmol/L (ref 3.5–5.1)
Sodium: 140 mmol/L (ref 135–145)
Total Bilirubin: 0.5 mg/dL (ref 0.0–1.2)
Total Protein: 7.1 g/dL (ref 6.5–8.1)

## 2023-08-01 LAB — PSA: Prostatic Specific Antigen: 0.06 ng/mL (ref 0.00–4.00)

## 2023-08-01 LAB — CBC WITH DIFFERENTIAL/PLATELET
Abs Immature Granulocytes: 0.14 10*3/uL — ABNORMAL HIGH (ref 0.00–0.07)
Basophils Absolute: 0.1 10*3/uL (ref 0.0–0.1)
Basophils Relative: 1 %
Eosinophils Absolute: 0.1 10*3/uL (ref 0.0–0.5)
Eosinophils Relative: 1 %
HCT: 41.5 % (ref 39.0–52.0)
Hemoglobin: 13.3 g/dL (ref 13.0–17.0)
Immature Granulocytes: 1 %
Lymphocytes Relative: 13 %
Lymphs Abs: 1.7 10*3/uL (ref 0.7–4.0)
MCH: 31.4 pg (ref 26.0–34.0)
MCHC: 32 g/dL (ref 30.0–36.0)
MCV: 98.1 fL (ref 80.0–100.0)
Monocytes Absolute: 1.2 10*3/uL — ABNORMAL HIGH (ref 0.1–1.0)
Monocytes Relative: 9 %
Neutro Abs: 9.2 10*3/uL — ABNORMAL HIGH (ref 1.7–7.7)
Neutrophils Relative %: 75 %
Platelets: 283 10*3/uL (ref 150–400)
RBC: 4.23 MIL/uL (ref 4.22–5.81)
RDW: 13.2 % (ref 11.5–15.5)
WBC: 12.4 10*3/uL — ABNORMAL HIGH (ref 4.0–10.5)
nRBC: 0 % (ref 0.0–0.2)

## 2023-08-08 ENCOUNTER — Inpatient Hospital Stay (HOSPITAL_BASED_OUTPATIENT_CLINIC_OR_DEPARTMENT_OTHER): Payer: Medicaid Other | Admitting: Hematology

## 2023-08-08 ENCOUNTER — Other Ambulatory Visit: Payer: Self-pay | Admitting: Hematology

## 2023-08-08 VITALS — BP 127/93 | HR 99 | Temp 97.3°F | Resp 20 | Wt 252.4 lb

## 2023-08-08 DIAGNOSIS — C772 Secondary and unspecified malignant neoplasm of intra-abdominal lymph nodes: Secondary | ICD-10-CM | POA: Diagnosis not present

## 2023-08-08 DIAGNOSIS — C61 Malignant neoplasm of prostate: Secondary | ICD-10-CM | POA: Diagnosis not present

## 2023-08-08 NOTE — Progress Notes (Signed)
 Patient is taking Zytiga as prescribed. He has not missed any doses and reports no side effects at this time.

## 2023-08-08 NOTE — Patient Instructions (Signed)
 [facility] at [facility] Discharge Instructions   You were seen and examined today by Dr. Cheree Cords.  He reviewed the results of your lab work which are normal/stable.   Continue Zytiga  and prednisone  as prescribed.   We will see you back in 3 months. We will repeat lab work prior to this visit.    Return as scheduled.    Thank you for choosing [facility] at [facility] to provide your oncology and hematology care.  To afford each patient quality time with our provider, please arrive at least 15 minutes before your scheduled appointment time.   If you have a lab appointment with the Cancer Center please come in thru the Main Entrance and check in at the main information desk.  You need to re-schedule your appointment should you arrive 10 or more minutes late.  We strive to give you quality time with our providers, and arriving late affects you and other patients whose appointments are after yours.  Also, if you no show three or more times for appointments you may be dismissed from the clinic at the providers discretion.     Again, thank you for choosing [facility].  Our hope is that these requests will decrease the amount of time that you wait before being seen by our physicians.       _____________________________________________________________  Should you have questions after your visit to [facility], please contact our office at [phone] and follow the prompts.  Our office hours are 8:00 a.m. and 4:30 p.m. Monday - Friday.  Please note that voicemails left after 4:00 p.m. may not be returned until the following business day.  We are closed weekends and major holidays.  You do have access to a nurse 24-7, just call the main number to the clinic [phone] and do not press any options, hold on the line and a nurse will answer the phone.    For prescription refill requests, have your pharmacy contact our  office and allow 72 hours.    Due to Covid, you will need to wear a mask upon entering the hospital. If you do not have a mask, a mask will be given to you at the Main Entrance upon arrival. For doctor visits, patients may have 1 support person age 18 or older with them. For treatment visits, patients can not have anyone with them due to social distancing guidelines and our immunocompromised population.

## 2023-08-08 NOTE — Progress Notes (Signed)
 [facility] [address] [place] [address]    Clinic Day:  08/08/23   Referring physician: Rosamond Leta NOVAK, MD  Patient Care Team: Rosamond Leta NOVAK, MD as PCP - General (Internal Medicine) Debera Jayson MATSU, MD as PCP - Cardiology (Cardiology) Rogers Hai, MD as Medical Oncologist (Medical Oncology)   ASSESSMENT & PLAN:   Assessment: 1.  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  Serum testosterone on 12/17/2019 of 197. -Bone scan on 12/18/2019 at [facility] shows small focus of uptake seen left inferior orbit favoring benign etiology.  No suspicious foci of bone meta stasis. -CTAP with contrast on 12/18/2019 showed retroperitoneal and bilateral iliac chain metastatic adenopathy.  Left para-aortic lymph node measures 15 mm.  Right external iliac lymph node measures 22 x 35 mm.  Left external iliac lymph node measures 26 x 35 mm.  Ill-defined hypoenhancing 12 mm hepatic lesion, indeterminate, however concerning for metastasis.  Sclerotic focus in the right posterior ilium, indeterminate. -MRI of the abdomen on 01/10/2020 shows tiny 8 mm low-attenuation lesion in the posterior right hepatic lobe, too small to characterize.  No other significant abnormalities. -Abiraterone  and prednisone  started around 02/01/2020.  Degarelix  started on 01/29/2020. -Lupron  45 mg on 02/26/2020.   2.  Family history: -Father died of prostate cancer at age 65.  Sister had cancer on her leg resected.  Paternal uncle had cancer, patient does not know the type. - Germline mutation testing was negative.    Plan: 1.  Metastatic castration sensitive prostate cancer: - He is tolerating abiraterone  and prednisone  reasonably well.  Occasional hot flashes present. - Reported numbness in the feet and hands are and off for the last 1 month.  Will closely monitor. -  Reviewed labs from 08/01/2023: Normal LFTs and creatinine.  CBC with mild neutrophilic and monocytic leukocytosis.  Likely from prednisone  use. - PSA 0.06, up from 0.04 on 04/20/2023.  For the last 1 year, his PSA has been ranging between 0.04-0.06. - Last Eligard  45 mg on 04/25/2023. - Continue abiraterone  1000 mg daily and prednisone  5 mg daily.  RTC 3 months for follow-up with repeat PSA and labs.   2.  Hypokalemia: - Continue potassium 10 mill equivalents daily.  Potassium is 3.8.   3.  Fluid retention: - He has 1+ edema today.  Start taking Lasix  20 mg daily in the mornings as needed.   4.  Osteopenia (DEXA scan 07/15/2021 T score -1.7): - No dental issues reported.  Calcium is 9.6 today.  He will continue Prolia  every 6 months.    Orders Placed This Encounter  Procedures   CBC with Differential    Standing Status:   Future    Expected Date:   10/30/2023    Expiration Date:   08/07/2024   Comprehensive metabolic panel    Standing Status:   Future    Expected Date:   10/30/2023    Expiration Date:   08/07/2024   PSA    Standing Status:   Future    Expected Date:   10/30/2023    Expiration Date:   08/07/2024     LILLETTE Hummingbird R Teague,acting as a scribe for Hai Rogers, MD.,have documented all relevant documentation on the behalf of Hai Rogers, MD,as directed by  Hai Rogers, MD while in the presence of Hai Rogers, MD.  I, Hai Rogers MD, have reviewed the above documentation for  accuracy and completeness, and I agree with the above.     Alean Stands, MD   1/14/20252:50 PM  CHIEF COMPLAINT:   Diagnosis: metastatic castration sensitive prostate cancer to the retroperitoneal lymph nodes    Cancer Staging  No matching staging information was found for the patient.    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 The report date is 03/15/2020.   The Common Hereditary Cancers Panel offered by [facility] includes sequencing and/or deletion duplication testing of the following 47 genes: APC, ATM, AXIN2, BARD1, BMPR1A, BRCA1, BRCA2, BRIP1, CDH1, CDK4, CDKN2A (p14ARF), CDKN2A (p16INK4a), CHEK2, CTNNA1, DICER1, EPCAM (Deletion/duplication testing only), GREM1 (promoter region deletion/duplication testing only), KIT, MEN1, MLH1, MSH2, MSH3, MSH6, MUTYH, NBN, NF1, NTHL1, PALB2, PDGFRA, PMS2, POLD1, POLE, PTEN, RAD50, RAD51C, RAD51D, SDHB, SDHC, SDHD, SMAD4, SMARCA4. STK11, TP53, TSC1, TSC2, and VHL.  The following genes were evaluated for sequence changes only: SDHA and HOXB13 c.251G>A variant only. The Prostate Cancer HRR Panel offered by [facility] includes sequencing and/or deletion/duplication analysis of the following 10 genes: ATM, BARD1, BRCA1, BRCA2, BRIP1, CHEK2, FANCL, PALB2, RAD51C, RAD51D.      INTERVAL HISTORY:   Kylo is a 63 y.o. male presenting to clinic today for follow up of metastatic castration sensitive prostate cancer to the retroperitoneal lymph nodes. He was last seen by me on 04/25/23.  Today, he states that he is doing well overall. His appetite level is at 100%. His energy level is at 70%. He is accompanied by his wife.  He is tolerating Xytiga and prednisone  well. He denies any new acute onset pains or infections in the last 3 months. He reports mild intermittent hot flashes. He denies any jaw or dental issues. Constipation is well controlled with stool softeners. He ran out of Potassium this week, but is otherwise taking it as prescribed.   He reports occasional numbness in the hands and feet, particularly on the right hand, that  began 1 month ago. He does not have DM. He does have occasional ankle edema, and intermittently takes furosemide  20 mg.   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)     Surgical History: Past Surgical History:  Procedure Laterality Date   FOREIGN BODY REMOVAL N/A 07/13/2017   Procedure: FOREIGN BODY REMOVAL ADULT;  Surgeon: Terri Alan PARAS, MD;  Location: [facility] OR;  Service: ENT;  Laterality: N/A;   LEG SURGERY     rods in leg from fracture   RIGID ESOPHAGOSCOPY N/A 07/13/2017   Procedure: RIGID ESOPHAGOSCOPY;  Surgeon: Terri Alan PARAS, MD;  Location: [facility] OR;  Service: ENT;  Laterality: N/A;    Social History: Social History   Socioeconomic History   Marital status: Single    Spouse name: Not on file   Number of children: 0   Years of education: Not on file   Highest  education level: Not on file  Occupational History   Occupation: EMPLOYED  Tobacco Use   Smoking status: Never   Smokeless tobacco: Never  Vaping Use   Vaping status: Never Used  Substance and Sexual Activity   Alcohol  use: No   Drug use: No   Sexual activity: Not on file  Other Topics Concern   Not on file  Social History Narrative   Not on file   Social Drivers of Health   Financial Resource Strain: Medium Risk (03/15/2021)   Overall Financial Resource Strain (CARDIA)    Difficulty of Paying Living Expenses: Somewhat hard  Food Insecurity: No Food Insecurity (03/15/2021)   Hunger Vital Sign    Worried About Running Out of Food in the Last Year: Never true    Ran Out of Food in the Last Year: Never true  Transportation Needs: No Transportation Needs (03/15/2021)   PRAPARE - Transportation    Lack of Transportation (Medical): No    Lack of Transportation  (Non-Medical): No  Physical Activity: Inactive (03/15/2021)   Exercise Vital Sign    Days of Exercise per Week: 0 days    Minutes of Exercise per Session: 0 min  Stress: Stress Concern Present (03/15/2021)   [employer] of Occupational Health - Occupational Stress Questionnaire    Feeling of Stress : Rather much  Social Connections: Socially Isolated (03/15/2021)   Social Connection and Isolation Panel [NHANES]    Frequency of Communication with Friends and Family: Never    Frequency of Social Gatherings with Friends and Family: Never    Attends Religious Services: Never    Database Administrator or Organizations: No    Attends Banker Meetings: Never    Marital Status: Divorced  Catering Manager Violence: Not At Risk (03/15/2021)   Humiliation, Afraid, Rape, and Kick questionnaire    Fear of Current or Ex-Partner: No    Emotionally Abused: No    Physically Abused: No    Sexually Abused: No    Family History: Family History  Problem Relation Age of Onset   Dementia Mother    Prostate cancer Father 49       metastatic   Cancer Sister        cancer on leg, dx. in her 37s   Lymphoma Brother 34   Lung cancer Paternal Aunt        dx. >50    Current Medications:  Current Outpatient Medications:    abiraterone  acetate (ZYTIGA ) 250 MG tablet, Take 4 tablets (1,000 mg total) by mouth daily. Take on an empty stomach 1 hour before or 2 hours after a meal, Disp: 120 tablet, Rfl: 11   citalopram (CELEXA) 20 MG tablet, Take 20 mg by mouth daily., Disp: , Rfl:    furosemide  (LASIX ) 20 MG tablet, TAKE 1 TABLET(20 MG) BY MOUTH DAILY AS NEEDED, Disp: 30 tablet, Rfl: 2   HYDROcodone-acetaminophen  (NORCO/VICODIN) 5-325 MG tablet, Take 1 tablet by mouth every 4 (four) hours as needed., Disp: , Rfl:    ondansetron  (ZOFRAN ) 4 MG tablet, Take 4 mg by mouth 3 (three) times daily as needed for nausea., Disp: , Rfl:    potassium chloride  (KLOR-CON ) 10 MEQ tablet, TAKE 1 TABLET(10  MEQ) BY MOUTH DAILY, Disp: 30 tablet, Rfl: 6   predniSONE  (DELTASONE ) 5 MG tablet, TAKE 1 TABLET(5 MG) BY MOUTH TWICE DAILY WITH A MEAL, Disp: 60 tablet, Rfl: 11   prochlorperazine  (COMPAZINE ) 10 MG tablet, Take 1 tablet (10 mg total) by mouth  every 6 (six) hours as needed for nausea or vomiting., Disp: 60 tablet, Rfl: 2   Allergies: No Known Allergies  REVIEW OF SYSTEMS:   Review of Systems  Constitutional:  Negative for chills, fatigue and fever.  HENT:   Negative for lump/mass, mouth sores, nosebleeds, sore throat and trouble swallowing.   Eyes:  Negative for eye problems.  Respiratory:  Negative for cough and shortness of breath.   Cardiovascular:  Negative for chest pain, leg swelling and palpitations.  Gastrointestinal:  Positive for constipation. Negative for abdominal pain, diarrhea, nausea and vomiting.  Endocrine: Positive for hot flashes.  Genitourinary:  Positive for nocturia. Negative for bladder incontinence, difficulty urinating, dysuria, frequency and hematuria.   Musculoskeletal:  Negative for arthralgias, back pain, flank pain, myalgias and neck pain.  Skin:  Negative for itching and rash.  Neurological:  Positive for dizziness (occasional). Negative for headaches and numbness.       +tingling in hands and feet  Hematological:  Does not bruise/bleed easily.  Psychiatric/Behavioral:  Negative for depression, sleep disturbance and suicidal ideas. The patient is not nervous/anxious.   All other systems reviewed and are negative.    VITALS:   Blood pressure (!) 127/93, pulse 99, temperature (!) 97.3 F (36.3 C), temperature source Tympanic, resp. rate 20, weight 252 lb 6.8 oz (114.5 kg), SpO2 97%.  Wt Readings from Last 3 Encounters:  08/08/23 252 lb 6.8 oz (114.5 kg)  04/25/23 242 lb (109.8 kg)  01/18/23 230 lb (104.3 kg)    Body mass index is 37.28 kg/m.  Performance status (ECOG): 1 - Symptomatic but completely ambulatory  PHYSICAL EXAM:   Physical  Exam Vitals and nursing note reviewed. Exam conducted with a chaperone present.  Constitutional:      Appearance: Normal appearance.  Cardiovascular:     Rate and Rhythm: Normal rate and regular rhythm.     Pulses: Normal pulses.     Heart sounds: Normal heart sounds.  Pulmonary:     Effort: Pulmonary effort is normal.     Breath sounds: Normal breath sounds.  Abdominal:     Palpations: Abdomen is soft. There is no hepatomegaly, splenomegaly or mass.     Tenderness: There is no abdominal tenderness.  Musculoskeletal:     Right lower leg: Edema present.     Left lower leg: Edema present.  Lymphadenopathy:     Cervical: No cervical adenopathy.     Right cervical: No superficial, deep or posterior cervical adenopathy.    Left cervical: No superficial, deep or posterior cervical adenopathy.     Upper Body:     Right upper body: No supraclavicular or axillary adenopathy.     Left upper body: No supraclavicular or axillary adenopathy.  Neurological:     General: No focal deficit present.     Mental Status: He is alert and oriented to person, place, and time.  Psychiatric:        Mood and Affect: Mood normal.        Behavior: Behavior normal.     LABS:      Latest Ref Rng & Units 08/01/2023   12:57 PM 04/20/2023    2:28 PM 01/11/2023    2:30 PM  CBC  WBC 4.0 - 10.5 K/uL 12.4  12.7  12.4   Hemoglobin 13.0 - 17.0 g/dL 86.6  86.8  86.5   Hematocrit 39.0 - 52.0 % 41.5  40.0  40.5   Platelets 150 - 400 K/uL 283  271  273  Latest Ref Rng & Units 08/01/2023   12:57 PM 04/20/2023    2:28 PM 01/24/2023    7:58 AM  CMP  Glucose 70 - 99 mg/dL 97  897    BUN 8 - 23 mg/dL 15  20    Creatinine 9.38 - 1.24 mg/dL 9.04  9.15    Sodium 864 - 145 mmol/L 140  139    Potassium 3.5 - 5.1 mmol/L 3.8  3.5  3.6   Chloride 98 - 111 mmol/L 106  105    CO2 22 - 32 mmol/L 26  26    Calcium 8.9 - 10.3 mg/dL 9.6  9.3    Total Protein 6.5 - 8.1 g/dL 7.1  7.0    Total Bilirubin 0.0 - 1.2 mg/dL 0.5   0.7    Alkaline Phos 38 - 126 U/L 66  76    AST 15 - 41 U/L 14  15    ALT 0 - 44 U/L 9  9       No results found for: CEA1, CEA / No results found for: CEA1, CEA No results found for: PSA1 No results found for: CAN199 No results found for: CAN125  No results found for: TOTALPROTELP, ALBUMINELP, A1GS, A2GS, BETS, BETA2SER, GAMS, MSPIKE, SPEI No results found for: TIBC, FERRITIN, IRONPCTSAT Lab Results  Component Value Date   LDH 167 04/20/2020   LDH 145 02/17/2020     STUDIES:   No results found.

## 2023-09-03 ENCOUNTER — Encounter (HOSPITAL_COMMUNITY): Payer: Self-pay | Admitting: Emergency Medicine

## 2023-09-03 ENCOUNTER — Emergency Department (HOSPITAL_COMMUNITY): Payer: Medicaid Other

## 2023-09-03 ENCOUNTER — Other Ambulatory Visit: Payer: Self-pay

## 2023-09-03 ENCOUNTER — Emergency Department (HOSPITAL_COMMUNITY)
Admission: EM | Admit: 2023-09-03 | Discharge: 2023-09-03 | Disposition: A | Discharge status: Home / Self Care | Payer: Medicaid Other | Attending: Emergency Medicine | Admitting: Emergency Medicine

## 2023-09-03 DIAGNOSIS — Z20822 Contact with and (suspected) exposure to covid-19: Secondary | ICD-10-CM | POA: Diagnosis not present

## 2023-09-03 DIAGNOSIS — R531 Weakness: Secondary | ICD-10-CM

## 2023-09-03 DIAGNOSIS — T451X1A Poisoning by antineoplastic and immunosuppressive drugs, accidental (unintentional), initial encounter: Secondary | ICD-10-CM | POA: Diagnosis present

## 2023-09-03 DIAGNOSIS — E876 Hypokalemia: Secondary | ICD-10-CM | POA: Insufficient documentation

## 2023-09-03 DIAGNOSIS — M791 Myalgia, unspecified site: Secondary | ICD-10-CM | POA: Insufficient documentation

## 2023-09-03 DIAGNOSIS — T50901A Poisoning by unspecified drugs, medicaments and biological substances, accidental (unintentional), initial encounter: Secondary | ICD-10-CM

## 2023-09-03 LAB — BASIC METABOLIC PANEL
Anion gap: 10 (ref 5–15)
BUN: 13 mg/dL (ref 8–23)
CO2: 26 mmol/L (ref 22–32)
Calcium: 8.5 mg/dL — ABNORMAL LOW (ref 8.9–10.3)
Chloride: 97 mmol/L — ABNORMAL LOW (ref 98–111)
Creatinine, Ser: 0.81 mg/dL (ref 0.61–1.24)
GFR, Estimated: >60 mL/min (ref >60)
Glucose, Bld: 118 mg/dL — ABNORMAL HIGH (ref 70–99)
Potassium: 3 mmol/L — ABNORMAL LOW (ref 3.5–5.1)
Sodium: 133 mmol/L — ABNORMAL LOW (ref 135–145)

## 2023-09-03 LAB — CBC WITH DIFFERENTIAL/PLATELET
Abs Immature Granulocytes: 0.02 10*3/uL (ref 0.00–0.07)
Basophils Absolute: 0 10*3/uL (ref 0.0–0.1)
Basophils Relative: 1 %
Eosinophils Absolute: 0.1 10*3/uL (ref 0.0–0.5)
Eosinophils Relative: 1 %
HCT: 36.5 % — ABNORMAL LOW (ref 39.0–52.0)
Hemoglobin: 12.5 g/dL — ABNORMAL LOW (ref 13.0–17.0)
Immature Granulocytes: 1 %
Lymphocytes Relative: 23 %
Lymphs Abs: 1 10*3/uL (ref 0.7–4.0)
MCH: 31.5 pg (ref 26.0–34.0)
MCHC: 34.2 g/dL (ref 30.0–36.0)
MCV: 91.9 fL (ref 80.0–100.0)
Monocytes Absolute: 1.1 10*3/uL — ABNORMAL HIGH (ref 0.1–1.0)
Monocytes Relative: 24 %
Neutro Abs: 2.3 10*3/uL (ref 1.7–7.7)
Neutrophils Relative %: 50 %
Platelets: 176 10*3/uL (ref 150–400)
RBC: 3.97 MIL/uL — ABNORMAL LOW (ref 4.22–5.81)
RDW: 13 % (ref 11.5–15.5)
WBC: 4.4 10*3/uL (ref 4.0–10.5)
nRBC: 0 % (ref 0.0–0.2)

## 2023-09-03 LAB — RESP PANEL BY RT-PCR (RSV, FLU A&B, COVID)  RVPGX2
Influenza A by PCR: NEGATIVE
Influenza B by PCR: NEGATIVE
Resp Syncytial Virus by PCR: NEGATIVE
SARS Coronavirus 2 by RT PCR: NEGATIVE

## 2023-09-03 MED ORDER — POTASSIUM CHLORIDE CRYS ER 20 MEQ PO TBCR
40.0000 meq | EXTENDED_RELEASE_TABLET | Freq: Once | ORAL | Status: AC
  Administered 2023-09-03: 40 meq via ORAL
  Filled 2023-09-03: qty 2

## 2023-09-03 NOTE — ED Provider Notes (Signed)
 [place] [facility] AT [facility] Provider Note   CSN: [id number] Arrival date & time: 09/03/23  1050     History  Chief Complaint  Patient presents with   Weakness   Drug Overdose    Randall Dawson is a 63 y.o. male.  He accidentally took an extra dose of Zytiga  today.  Usually takes 1000 mg daily and took 2000 today.  For the last 3 or 4 days he has had nausea vomiting headache body aches and fatigue.  He thinks he might have the flu.  He said he actually feels a little bit better today.  No new complaints.  No fever.  No dizziness lightheadedness.  The history is provided by the patient.  Influenza Presenting symptoms: diarrhea, fatigue, headache, myalgias, nausea and vomiting   Presenting symptoms: no shortness of breath        Home Medications Prior to Admission medications   Medication Sig Start Date End Date Taking? Authorizing Provider  abiraterone  acetate (ZYTIGA ) 250 MG tablet Take 4 tablets (1,000 mg total) by mouth daily. Take on an empty stomach 1 hour before or 2 hours after a meal 05/18/23   Katragadda, Sreedhar, MD  citalopram (CELEXA) 20 MG tablet Take 20 mg by mouth daily. 12/28/22   [provider]  furosemide  (LASIX ) 20 MG tablet TAKE 1 TABLET(20 MG) BY MOUTH DAILY AS NEEDED 08/01/23   Rogers Hai, MD  HYDROcodone-acetaminophen  (NORCO/VICODIN) 5-325 MG tablet Take 1 tablet by mouth every 4 (four) hours as needed. 06/17/22   [provider]  ondansetron  (ZOFRAN ) 4 MG tablet Take 4 mg by mouth 3 (three) times daily as needed for nausea. 06/17/22   [provider]  potassium chloride  (KLOR-CON ) 10 MEQ tablet TAKE 1 TABLET(10 MEQ) BY MOUTH DAILY 08/08/23   Rogers Hai, MD  predniSONE  (DELTASONE ) 5 MG tablet TAKE 1 TABLET(5 MG) BY MOUTH TWICE DAILY WITH A MEAL 01/24/23   Rogers Hai, MD  prochlorperazine  (COMPAZINE ) 10 MG tablet Take 1 tablet (10 mg total) by mouth every 6 (six) hours as  needed for nausea or vomiting. 05/25/20   Rogers Hai, MD      Allergies    Patient has no known allergies.    Review of Systems   Review of Systems  Constitutional:  Positive for fatigue.  Eyes:  Negative for visual disturbance.  Respiratory:  Negative for shortness of breath.   Cardiovascular:  Negative for chest pain.  Gastrointestinal:  Positive for diarrhea, nausea and vomiting.  Genitourinary:  Negative for dysuria.  Musculoskeletal:  Positive for myalgias.  Neurological:  Positive for headaches.    Physical Exam Updated Vital Signs BP (!) 127/92 (BP Location: Right Arm)   Pulse (!) 104   Temp 98.4 F (36.9 C) (Oral)   Resp 13   Ht 5' 9 (1.753 m)   Wt 114.5 kg   SpO2 97%   BMI 37.28 kg/m  Physical Exam Vitals and nursing note reviewed.  Constitutional:      General: He is not in acute distress.    Appearance: Normal appearance. He is well-developed.  HENT:     Head: Normocephalic and atraumatic.  Eyes:     Conjunctiva/sclera: Conjunctivae normal.  Cardiovascular:     Rate and Rhythm: Normal rate and regular rhythm.     Heart sounds: No murmur heard. Pulmonary:     Effort: Pulmonary effort is normal. No respiratory distress.     Breath sounds: Normal breath sounds.  Abdominal:  Palpations: Abdomen is soft.     Tenderness: There is no abdominal tenderness. There is no guarding or rebound.  Musculoskeletal:        General: No swelling.     Cervical back: Neck supple.  Skin:    General: Skin is warm and dry.     Capillary Refill: Capillary refill takes less than 2 seconds.  Neurological:     General: No focal deficit present.     Mental Status: He is alert.     ED Results / Procedures / Treatments   Labs (all labs ordered are listed, but only abnormal results are displayed) Labs Reviewed  BASIC METABOLIC PANEL - Abnormal; Notable for the following components:      Result Value   Sodium 133 (*)    Potassium 3.0 (*)    Chloride 97 (*)     Glucose, Bld 118 (*)    Calcium 8.5 (*)    All other components within normal limits  CBC WITH DIFFERENTIAL/PLATELET - Abnormal; Notable for the following components:   RBC 3.97 (*)    Hemoglobin 12.5 (*)    HCT 36.5 (*)    Monocytes Absolute 1.1 (*)    All other components within normal limits  RESP PANEL BY RT-PCR (RSV, FLU A&B, COVID)  [id number]    EKG EKG Interpretation Date/Time:  Sunday September 03 2023 11:36:42 EST Ventricular Rate:  106 PR Interval:  150 QRS Duration:  96 QT Interval:  294 QTC Calculation: 390 R Axis:   8  Text Interpretation: Sinus tachycardia Otherwise normal ECG No previous ECGs available Confirmed by Towana Sharper [phone]) on 09/03/2023 11:45:08 AM  Radiology DG Chest Port 1 View Result Date: 09/03/2023 CLINICAL DATA:  Cough with weakness. EXAM: PORTABLE CHEST 1 VIEW COMPARISON:  None Available. FINDINGS: Low lung volumes. Cardiopericardial silhouette is at upper limits of normal for size. The lungs are clear without focal pneumonia, edema, pneumothorax or pleural effusion. No acute bony abnormality. Telemetry leads overlie the chest. IMPRESSION: Low volume film without acute cardiopulmonary findings. Electronically Signed   By: Camellia Candle M.D.   On: 09/03/2023 13:46    Procedures Procedures    Medications Ordered in ED Medications - No data to display  ED Course/ Medical Decision Making/ A&P Clinical Course as of 09/03/23 1751  Sun Sep 03, 2023  1245 Poison control was contacted and they said the max dose daily was 2000 mg which is what the patient took.  Typical side effects are nausea vomiting diarrhea. [MB]  1349 Chest x-ray does not show any acute infiltrate.  Awaiting radiology reading. [MB]    Clinical Course User Index [MB] Towana Sharper BROCKS, MD                                 Medical Decision Making Amount and/or Complexity of Data Reviewed Labs: ordered. Radiology: ordered.  Risk Prescription drug management.   This  patient complains of general weakness nausea vomiting, medication overdose; this involves an extensive number of treatment Options and is a complaint that carries with it a high risk of complications and morbidity. The differential includes medication side effect, infection including COVID, flu, pneumonia, dehydration, metabolic derangement  I ordered, reviewed and interpreted labs, which included CBC with normal white count low stable hemoglobin, chemistries with low sodium and potassium, COVID and flu negative I ordered medication oral potassium and reviewed PMP when indicated. I ordered imaging studies which  included chest x-ray and I independently    visualized and interpreted imaging which showed no acute findings Additional history obtained from patient's daughter and wife Previous records obtained and reviewed in epic including recent oncology notes Cardiac monitoring reviewed, sinus rhythm Social determinants considered, patient physically inactive depression increase stress social isolation Critical Interventions: None  After the interventions stated above, I reevaluated the patient and found patient to be hemodynamically stable Admission and further testing considered, no indications for admission at this time.  Poison control did not feel that the extra dose warranted any type of extended observation.  Patient understands to closely follow-up with PCP and return if any worsening or concerning symptoms         Final Clinical Impression(s) / ED Diagnoses Final diagnoses:  Accidental overdose, initial encounter  Generalized weakness  Hypokalemia    Rx / DC Orders ED Discharge Orders     None         Towana Ozell BROCKS, MD 09/03/23 1753

## 2023-09-03 NOTE — ED Triage Notes (Signed)
 Pt reports flu like symptoms since Thursday. Endorses n/v, weakness and headaches since then. Reports his wife gave him chemo meds this morning and then he took another dose today. Also reports missing a dose Friday due to nausea.

## 2023-09-03 NOTE — ED Notes (Signed)
 A family member who was not on the chart requested an update and they were informed they were not able to get updates. Family requested pt be notified to call Lavonia Powers for an update. Pt was informed.

## 2023-09-03 NOTE — Discharge Instructions (Addendum)
 You were seen in the emergency department for fatigue nausea vomiting body aches along with accidentally taking an extra dose of your cancer medication.  Poison control felt that the extra dose would be safe although you may experience some GI symptoms.  Your COVID and flu test were negative.  Your lab work showed a mildly low potassium and you are given potassium.  Your chest x-ray did not show any signs of pneumonia.  Please continue to rest and keep well-hydrated.  Follow-up with Dr. Katragadda and your primary care doctor.  Return to the emergency department if any worsening or concerning symptoms

## 2023-09-03 NOTE — ED Notes (Signed)
 Poison control notified. States that max dose is 2000 mg a day so pt can take normal dose tomorrow. Pt may have n/v/d.

## 2023-10-24 ENCOUNTER — Inpatient Hospital Stay: Payer: Medicaid Other

## 2023-10-24 ENCOUNTER — Inpatient Hospital Stay: Payer: Medicaid Other | Attending: Hematology

## 2023-10-24 VITALS — BP 127/85 | HR 98 | Temp 97.8°F | Resp 19

## 2023-10-24 DIAGNOSIS — E876 Hypokalemia: Secondary | ICD-10-CM | POA: Diagnosis not present

## 2023-10-24 DIAGNOSIS — M8580 Other specified disorders of bone density and structure, unspecified site: Secondary | ICD-10-CM | POA: Insufficient documentation

## 2023-10-24 DIAGNOSIS — D649 Anemia, unspecified: Secondary | ICD-10-CM | POA: Insufficient documentation

## 2023-10-24 DIAGNOSIS — C61 Malignant neoplasm of prostate: Secondary | ICD-10-CM | POA: Insufficient documentation

## 2023-10-24 DIAGNOSIS — R232 Flushing: Secondary | ICD-10-CM | POA: Insufficient documentation

## 2023-10-24 DIAGNOSIS — Z191 Hormone sensitive malignancy status: Secondary | ICD-10-CM | POA: Diagnosis not present

## 2023-10-24 DIAGNOSIS — Z79818 Long term (current) use of other agents affecting estrogen receptors and estrogen levels: Secondary | ICD-10-CM | POA: Diagnosis not present

## 2023-10-24 DIAGNOSIS — Z7952 Long term (current) use of systemic steroids: Secondary | ICD-10-CM | POA: Insufficient documentation

## 2023-10-24 DIAGNOSIS — C772 Secondary and unspecified malignant neoplasm of intra-abdominal lymph nodes: Secondary | ICD-10-CM | POA: Insufficient documentation

## 2023-10-24 DIAGNOSIS — Z801 Family history of malignant neoplasm of trachea, bronchus and lung: Secondary | ICD-10-CM | POA: Insufficient documentation

## 2023-10-24 DIAGNOSIS — R609 Edema, unspecified: Secondary | ICD-10-CM | POA: Diagnosis not present

## 2023-10-24 DIAGNOSIS — Z79899 Other long term (current) drug therapy: Secondary | ICD-10-CM | POA: Diagnosis not present

## 2023-10-24 DIAGNOSIS — Z809 Family history of malignant neoplasm, unspecified: Secondary | ICD-10-CM | POA: Insufficient documentation

## 2023-10-24 DIAGNOSIS — Z8042 Family history of malignant neoplasm of prostate: Secondary | ICD-10-CM | POA: Insufficient documentation

## 2023-10-24 DIAGNOSIS — M25561 Pain in right knee: Secondary | ICD-10-CM | POA: Insufficient documentation

## 2023-10-24 LAB — CBC WITH DIFFERENTIAL/PLATELET
Abs Immature Granulocytes: 0.11 10*3/uL — ABNORMAL HIGH (ref 0.00–0.07)
Basophils Absolute: 0.1 10*3/uL (ref 0.0–0.1)
Basophils Relative: 1 %
Eosinophils Absolute: 0.2 10*3/uL (ref 0.0–0.5)
Eosinophils Relative: 2 %
HCT: 38.2 % — ABNORMAL LOW (ref 39.0–52.0)
Hemoglobin: 12.2 g/dL — ABNORMAL LOW (ref 13.0–17.0)
Immature Granulocytes: 1 %
Lymphocytes Relative: 22 %
Lymphs Abs: 2.3 10*3/uL (ref 0.7–4.0)
MCH: 31.4 pg (ref 26.0–34.0)
MCHC: 31.9 g/dL (ref 30.0–36.0)
MCV: 98.2 fL (ref 80.0–100.0)
Monocytes Absolute: 1 10*3/uL (ref 0.1–1.0)
Monocytes Relative: 10 %
Neutro Abs: 6.8 10*3/uL (ref 1.7–7.7)
Neutrophils Relative %: 64 %
Platelets: 259 10*3/uL (ref 150–400)
RBC: 3.89 MIL/uL — ABNORMAL LOW (ref 4.22–5.81)
RDW: 13.4 % (ref 11.5–15.5)
WBC: 10.5 10*3/uL (ref 4.0–10.5)
nRBC: 0 % (ref 0.0–0.2)

## 2023-10-24 LAB — COMPREHENSIVE METABOLIC PANEL WITH GFR
ALT: 8 U/L (ref 0–44)
AST: 13 U/L — ABNORMAL LOW (ref 15–41)
Albumin: 3.5 g/dL (ref 3.5–5.0)
Alkaline Phosphatase: 74 U/L (ref 38–126)
Anion gap: 6 (ref 5–15)
BUN: 18 mg/dL (ref 8–23)
CO2: 29 mmol/L (ref 22–32)
Calcium: 9.5 mg/dL (ref 8.9–10.3)
Chloride: 105 mmol/L (ref 98–111)
Creatinine, Ser: 0.77 mg/dL (ref 0.61–1.24)
GFR, Estimated: >60 mL/min (ref >60)
Glucose, Bld: 78 mg/dL (ref 70–99)
Potassium: 3.6 mmol/L (ref 3.5–5.1)
Sodium: 140 mmol/L (ref 135–145)
Total Bilirubin: 0.5 mg/dL (ref 0.0–1.2)
Total Protein: 7.1 g/dL (ref 6.5–8.1)

## 2023-10-24 LAB — PSA: Prostatic Specific Antigen: 0.06 ng/mL (ref 0.00–4.00)

## 2023-10-24 MED ORDER — DENOSUMAB 60 MG/ML ~~LOC~~ SOSY
60.0000 mg | PREFILLED_SYRINGE | Freq: Once | SUBCUTANEOUS | Status: AC
  Administered 2023-10-24: 60 mg via SUBCUTANEOUS
  Filled 2023-10-24: qty 1

## 2023-10-24 MED ORDER — LEUPROLIDE ACETATE (6 MONTH) 45 MG ~~LOC~~ KIT
45.0000 mg | PACK | Freq: Once | SUBCUTANEOUS | Status: AC
  Administered 2023-10-24: 45 mg via SUBCUTANEOUS
  Filled 2023-10-24: qty 45

## 2023-10-24 NOTE — Patient Instructions (Signed)
 [facility] - A DEPT OF [facility]  Discharge Instructions: Thank you for choosing [facility] to provide your oncology and hematology care.  If you have a lab appointment with the Cancer Center - please note that after April 8th, 2024, all labs will be drawn in the cancer center.  You do not have to check in or register with the main entrance as you have in the past but will complete your check-in in the cancer center.  Wear comfortable clothing and clothing appropriate for easy access to any Portacath or PICC line.   We strive to give you quality time with your provider. You may need to reschedule your appointment if you arrive late (15 or more minutes).  Arriving late affects you and other patients whose appointments are after yours.  Also, if you miss three or more appointments without notifying the office, you may be dismissed from the clinic at the provider's discretion.      For prescription refill requests, have your pharmacy contact our office and allow 72 hours for refills to be completed.    Today you received the following chemotherapy and/or immunotherapy agents Prolia and Eligard injection.    BELOW ARE SYMPTOMS THAT SHOULD BE REPORTED IMMEDIATELY: *FEVER GREATER THAN 100.4 F (38 C) OR HIGHER *CHILLS OR SWEATING *NAUSEA AND VOMITING THAT IS NOT CONTROLLED WITH YOUR NAUSEA MEDICATION *UNUSUAL SHORTNESS OF BREATH *UNUSUAL BRUISING OR BLEEDING *URINARY PROBLEMS (pain or burning when urinating, or frequent urination) *BOWEL PROBLEMS (unusual diarrhea, constipation, pain near the anus) TENDERNESS IN MOUTH AND THROAT WITH OR WITHOUT PRESENCE OF ULCERS (sore throat, sores in mouth, or a toothache) UNUSUAL RASH, SWELLING OR PAIN  UNUSUAL VAGINAL DISCHARGE OR ITCHING   Items with * indicate a potential emergency and should be followed up as soon as possible or go to the Emergency Department if any problems should occur.  Please show the  CHEMOTHERAPY ALERT CARD or IMMUNOTHERAPY ALERT CARD at check-in to the Emergency Department and triage nurse.  Should you have questions after your visit or need to cancel or reschedule your appointment, please contact [facility] - A DEPT OF [facility] [phone]  and follow the prompts.  Office hours are 8:00 a.m. to 4:30 p.m. Monday - Friday. Please note that voicemails left after 4:00 p.m. may not be returned until the following business day.  We are closed weekends and major holidays. You have access to a nurse at all times for urgent questions. Please call the main number to the clinic [phone] and follow the prompts.  For any non-urgent questions, you may also contact your provider using MyChart. We now offer e-Visits for anyone 8 and older to request care online for non-urgent symptoms. For details visit [email or web address].   Also download the MyChart app! Go to the app store, search "MyChart", open the app, select , and log in with your MyChart username and password.

## 2023-10-24 NOTE — Progress Notes (Signed)
 Randall Dawson presents today for injection per the provider's orders.  Eligard and Prolia administration without incident; injection site WNL; see MAR for injection details.  Patient tolerated procedure well and without incident.  No questions or complaints noted at this time. Patient's Calcium noted to be 9.5.  Patient denies any tooth or jaw pain and no recent or future major dental work at this time. Patient reports taking Calcium/Vit D supplements as directed.  Discharged from clinic ambulatory in stable condition. Alert and oriented x 3. F/U with [facility] as scheduled.

## 2023-10-31 NOTE — Progress Notes (Signed)
 [facility] [address] [place] [address]    Clinic Day:  11/01/2023  Referring physician: Ignatius Specking, MD  Patient Care Team: Ignatius Specking, MD as PCP - General (Internal Medicine) Jonelle Sidle, MD as PCP - Cardiology (Cardiology) Doreatha Massed, MD as Medical Oncologist (Medical Oncology)   ASSESSMENT & PLAN:   Assessment: 1.  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  Serum testosterone on 12/17/2019 of 197. -Bone scan on 12/18/2019 at [facility] shows small focus of uptake seen left inferior orbit favoring benign etiology.  No suspicious foci of bone meta stasis. -CTAP with contrast on 12/18/2019 showed retroperitoneal and bilateral iliac chain metastatic adenopathy.  Left para-aortic lymph node measures 15 mm.  Right external iliac lymph node measures 22 x 35 mm.  Left external iliac lymph node measures 26 x 35 mm.  Ill-defined hypoenhancing 12 mm hepatic lesion, indeterminate, however concerning for metastasis.  Sclerotic focus in the right posterior ilium, indeterminate. -MRI of the abdomen on 01/10/2020 shows tiny 8 mm low-attenuation lesion in the posterior right hepatic lobe, too small to characterize.  No other significant abnormalities. -Abiraterone and prednisone started around 02/01/2020.  Degarelix started on 01/29/2020. -Lupron 45 mg on 02/26/2020.   2.  Family history: -Father died of prostate cancer at age 46.  Sister had cancer on her leg resected.  Paternal uncle had cancer, patient does not know the type. - Germline mutation testing was negative.    Plan: 1.  Metastatic castration sensitive prostate cancer: - He is tolerating abiraterone and prednisone reasonably well.  Occasional hot flashes are stable. - Reviewed labs from 10/24/2023: Normal LFTs and creatinine.  Potassium was normal.  CBC with mild  normocytic anemia.  PSA is 0.06 and stable. - For the last 1 and half year, his PSA has been ranging between 0.04-0.06. - Last Eligard 45 mg was on 10/24/2022. - Continue abiraterone 1000 mg daily and prednisone 5 mg daily.  RTC 12 weeks for follow-up with repeat PSA and labs. - He reports pain in his right knee from previous injury and right hip pain which is preventing him from lifting up his right leg.  I have recommended evaluation by orthopedics.  Patient prefers to be seen in [place].  Will refer to Dr. Eulah Pont.   2.  Hypokalemia: - Continue potassium 10 mEq daily.  Potassium is 3.6.   3.  Fluid retention: - Continue Lasix 20 mg daily as needed.  He is not requiring on a daily basis.   4.  Osteopenia (DEXA scan 07/15/2021 T score -1.7): - Last Prolia was on 10/24/2023.  Calcium today is 9.5.  Denies any dental issues or jaw pain.    Orders Placed This Encounter  Procedures   CBC with Differential    Standing Status:   Future    Expected Date:   01/29/2024    Expiration Date:   10/31/2024   Comprehensive metabolic panel    Standing Status:   Future    Expected Date:   01/29/2024    Expiration Date:   10/31/2024   PSA    Standing Status:   Future    Expected Date:   01/29/2024    Expiration Date:   10/31/2024      I,Katie Daubenspeck,acting as a scribe for Doreatha Massed, MD.,have documented all relevant documentation on the behalf of Doreatha Massed, MD,as directed by  Doreatha Massed, MD while in the presence of Doreatha Massed, MD.   I, Doreatha Massed MD, have reviewed the above documentation for accuracy and completeness, and I agree with the above.   Doreatha Massed, MD   4/9/202511:17 AM  CHIEF COMPLAINT:   Diagnosis: metastatic castration sensitive prostate cancer to the retroperitoneal lymph nodes    Cancer Staging  No matching staging information was found for the patient.    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 The report date is 03/15/2020.   The Common Hereditary Cancers Panel offered by [facility] includes sequencing and/or deletion duplication testing of the following 47 genes: APC, ATM, AXIN2, BARD1, BMPR1A, BRCA1, BRCA2, BRIP1, CDH1, CDK4, CDKN2A (p14ARF), CDKN2A (p16INK4a), CHEK2, CTNNA1, DICER1, EPCAM (Deletion/duplication testing only), GREM1 (promoter region deletion/duplication testing only), KIT, MEN1, MLH1, MSH2, MSH3, MSH6, MUTYH, NBN, NF1, NTHL1, PALB2, PDGFRA, PMS2, POLD1, POLE, PTEN, RAD50, RAD51C, RAD51D, SDHB, SDHC, SDHD, SMAD4, SMARCA4. STK11, TP53, TSC1, TSC2, and VHL.  The following genes were evaluated for sequence changes only: SDHA and HOXB13 c.251G>A variant only. The Prostate Cancer HRR Panel offered by [facility] includes sequencing and/or deletion/duplication analysis of the following 10 genes: ATM, BARD1, BRCA1, BRCA2, BRIP1, CHEK2, FANCL, PALB2, RAD51C, RAD51D.      INTERVAL HISTORY:   Randall Dawson is a 63 y.o. male presenting to clinic today for follow up of metastatic castration sensitive prostate cancer to the retroperitoneal lymph nodes. He was last seen by me on 08/08/23.  Today, he states that he is doing well overall. His appetite level is at 80%. His energy level is at 60%.  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)     Surgical History: Past Surgical History:  Procedure  Laterality Date   FOREIGN BODY REMOVAL N/A 07/13/2017   Procedure: FOREIGN BODY REMOVAL ADULT;  Surgeon: Graylin Shiver, MD;  Location: [facility] OR;  Service: ENT;  Laterality: N/A;   LEG SURGERY     rods in leg from fracture   RIGID ESOPHAGOSCOPY N/A 07/13/2017   Procedure: RIGID ESOPHAGOSCOPY;  Surgeon: Graylin Shiver, MD;  Location: [facility] OR;  Service: ENT;  Laterality: N/A;    Social History: Social History   Socioeconomic History   Marital status: Single    Spouse name: Not on file   Number of children: 0   Years of education: Not on file   Highest education level: Not on file  Occupational History   Occupation: EMPLOYED  Tobacco Use   Smoking status: Never   Smokeless tobacco: Never  Vaping Use   Vaping status: Never Used  Substance and Sexual Activity   Alcohol use: No   Drug use: No   Sexual activity: Not on file  Other Topics Concern   Not on file  Social History Narrative   Not on file   Social Drivers of Health  Financial Resource Strain: Medium Risk (03/15/2021)   Overall Financial Resource Strain (CARDIA)    Difficulty of Paying Living Expenses: Somewhat hard  Food Insecurity: No Food Insecurity (03/15/2021)   Hunger Vital Sign    Worried About Running Out of Food in the Last Year: Never true    Ran Out of Food in the Last Year: Never true  Transportation Needs: No Transportation Needs (03/15/2021)   PRAPARE - Administrator, Civil Service (Medical): No    Lack of Transportation (Non-Medical): No  Physical Activity: Inactive (03/15/2021)   Exercise Vital Sign    Days of Exercise per Week: 0 days    Minutes of Exercise per Session: 0 min  Stress: Stress Concern Present (03/15/2021)   [employer] of Occupational Health - Occupational Stress Questionnaire    Feeling of Stress : Rather much  Social Connections: Socially Isolated (03/15/2021)   Social Connection and Isolation Panel [NHANES]    Frequency of Communication with Friends and  Family: Never    Frequency of Social Gatherings with Friends and Family: Never    Attends Religious Services: Never    Database administrator or Organizations: No    Attends Banker Meetings: Never    Marital Status: Divorced  Catering manager Violence: Not At Risk (03/15/2021)   Humiliation, Afraid, Rape, and Kick questionnaire    Fear of Current or Ex-Partner: No    Emotionally Abused: No    Physically Abused: No    Sexually Abused: No    Family History: Family History  Problem Relation Age of Onset   Dementia Mother    Prostate cancer Father 109       metastatic   Cancer Sister        cancer on leg, dx. in her 56s   Lymphoma Brother 50   Lung cancer Paternal Aunt        dx. >50    Current Medications:  Current Outpatient Medications:    abiraterone acetate (ZYTIGA) 250 MG tablet, Take 4 tablets (1,000 mg total) by mouth daily. Take on an empty stomach 1 hour before or 2 hours after a meal, Disp: 120 tablet, Rfl: 11   citalopram (CELEXA) 20 MG tablet, Take 20 mg by mouth daily., Disp: , Rfl:    furosemide (LASIX) 20 MG tablet, TAKE 1 TABLET(20 MG) BY MOUTH DAILY AS NEEDED, Disp: 30 tablet, Rfl: 2   HYDROcodone-acetaminophen (NORCO/VICODIN) 5-325 MG tablet, Take 1 tablet by mouth every 4 (four) hours as needed., Disp: , Rfl:    ondansetron (ZOFRAN) 4 MG tablet, Take 4 mg by mouth 3 (three) times daily as needed for nausea., Disp: , Rfl:    potassium chloride (KLOR-CON) 10 MEQ tablet, TAKE 1 TABLET(10 MEQ) BY MOUTH DAILY, Disp: 30 tablet, Rfl: 6   predniSONE (DELTASONE) 5 MG tablet, TAKE 1 TABLET(5 MG) BY MOUTH TWICE DAILY WITH A MEAL, Disp: 60 tablet, Rfl: 11   prochlorperazine (COMPAZINE) 10 MG tablet, Take 1 tablet (10 mg total) by mouth every 6 (six) hours as needed for nausea or vomiting., Disp: 60 tablet, Rfl: 2   Allergies: No Known Allergies  REVIEW OF SYSTEMS:   Review of Systems  Constitutional:  Negative for chills, fatigue and fever.  HENT:    Negative for lump/mass, mouth sores, nosebleeds, sore throat and trouble swallowing.   Eyes:  Negative for eye problems.  Respiratory:  Negative for cough and shortness of breath.   Cardiovascular:  Negative for chest pain, leg swelling  and palpitations.  Gastrointestinal:  Positive for constipation. Negative for abdominal pain, diarrhea, nausea and vomiting.  Genitourinary:  Negative for bladder incontinence, difficulty urinating, dysuria, frequency, hematuria and nocturia.   Musculoskeletal:  Positive for arthralgias. Negative for back pain, flank pain, myalgias and neck pain.  Skin:  Negative for itching and rash.  Neurological:  Positive for headaches. Negative for dizziness and numbness.  Hematological:  Does not bruise/bleed easily.  Psychiatric/Behavioral:  Positive for sleep disturbance. Negative for depression and suicidal ideas. The patient is not nervous/anxious.   All other systems reviewed and are negative.    VITALS:   Blood pressure 120/85, pulse 100, temperature (!) 97.5 F (36.4 C), temperature source Oral, resp. rate 19, weight 250 lb 14.1 oz (113.8 kg), SpO2 100%.  Wt Readings from Last 3 Encounters:  11/01/23 250 lb 14.1 oz (113.8 kg)  09/03/23 252 lb 6.8 oz (114.5 kg)  08/08/23 252 lb 6.8 oz (114.5 kg)    Body mass index is 37.05 kg/m.  Performance status (ECOG): 1 - Symptomatic but completely ambulatory  PHYSICAL EXAM:   Physical Exam Vitals and nursing note reviewed. Exam conducted with a chaperone present.  Constitutional:      Appearance: Normal appearance.  Cardiovascular:     Rate and Rhythm: Normal rate and regular rhythm.     Pulses: Normal pulses.     Heart sounds: Normal heart sounds.  Pulmonary:     Effort: Pulmonary effort is normal.     Breath sounds: Normal breath sounds.  Abdominal:     Palpations: Abdomen is soft. There is no hepatomegaly, splenomegaly or mass.     Tenderness: There is no abdominal tenderness.  Musculoskeletal:      Right lower leg: No edema.     Left lower leg: No edema.  Lymphadenopathy:     Cervical: No cervical adenopathy.     Right cervical: No superficial, deep or posterior cervical adenopathy.    Left cervical: No superficial, deep or posterior cervical adenopathy.     Upper Body:     Right upper body: No supraclavicular or axillary adenopathy.     Left upper body: No supraclavicular or axillary adenopathy.  Neurological:     General: No focal deficit present.     Mental Status: He is alert and oriented to person, place, and time.  Psychiatric:        Mood and Affect: Mood normal.        Behavior: Behavior normal.     LABS:   CBC     Component Value Date/Time   WBC 10.5 10/24/2023 1041   RBC 3.89 (L) 10/24/2023 1041   HGB 12.2 (L) 10/24/2023 1041   HCT 38.2 (L) 10/24/2023 1041   PLT 259 10/24/2023 1041   MCV 98.2 10/24/2023 1041   MCH 31.4 10/24/2023 1041   MCHC 31.9 10/24/2023 1041   RDW 13.4 10/24/2023 1041   LYMPHSABS 2.3 10/24/2023 1041   MONOABS 1.0 10/24/2023 1041   EOSABS 0.2 10/24/2023 1041   BASOSABS 0.1 10/24/2023 1041    CMP      Component Value Date/Time   NA 140 10/24/2023 1041   K 3.6 10/24/2023 1041   CL 105 10/24/2023 1041   CO2 29 10/24/2023 1041   GLUCOSE 78 10/24/2023 1041   BUN 18 10/24/2023 1041   CREATININE 0.77 10/24/2023 1041   CALCIUM 9.5 10/24/2023 1041   PROT 7.1 10/24/2023 1041   ALBUMIN 3.5 10/24/2023 1041   AST 13 (L) 10/24/2023 1041  ALT 8 10/24/2023 1041   ALKPHOS 74 10/24/2023 1041   BILITOT 0.5 10/24/2023 1041   GFRNONAA >60 10/24/2023 1041   GFRAA >60 04/20/2020 1017     No results found for: "CEA1", "CEA" / No results found for: "CEA1", "CEA" No results found for: "PSA1" No results found for: "UXL244" No results found for: "CAN125"  No results found for: "TOTALPROTELP", "ALBUMINELP", "A1GS", "A2GS", "BETS", "BETA2SER", "GAMS", "MSPIKE", "SPEI" No results found for: "TIBC", "FERRITIN", "IRONPCTSAT" Lab Results   Component Value Date   LDH 167 04/20/2020   LDH 145 02/17/2020     STUDIES:   No results found.

## 2023-11-01 ENCOUNTER — Inpatient Hospital Stay (HOSPITAL_BASED_OUTPATIENT_CLINIC_OR_DEPARTMENT_OTHER): Payer: Medicaid Other | Admitting: Hematology

## 2023-11-01 VITALS — BP 120/85 | HR 100 | Temp 97.5°F | Resp 19 | Wt 250.9 lb

## 2023-11-01 DIAGNOSIS — C61 Malignant neoplasm of prostate: Secondary | ICD-10-CM

## 2023-11-01 DIAGNOSIS — C772 Secondary and unspecified malignant neoplasm of intra-abdominal lymph nodes: Secondary | ICD-10-CM

## 2023-11-01 NOTE — Patient Instructions (Addendum)
 [facility] at [facility] Discharge Instructions   You were seen and examined today by Dr. Ellin Saba.  He reviewed the results of your lab work which are normal/stable.  Continue Zytiga and prednisone as prescribed.    We will refer you to an orthopedic doctor.  We will see you back in 3 months. We will repeat lab work prior to this visit.   Return as scheduled.    Thank you for choosing [facility] at [facility] to provide your oncology and hematology care.  To afford each patient quality time with our provider, please arrive at least 15 minutes before your scheduled appointment time.   If you have a lab appointment with the Cancer Center please come in thru the Main Entrance and check in at the main information desk.  You need to re-schedule your appointment should you arrive 10 or more minutes late.  We strive to give you quality time with our providers, and arriving late affects you and other patients whose appointments are after yours.  Also, if you no show three or more times for appointments you may be dismissed from the clinic at the providers discretion.     Again, thank you for choosing [facility].  Our hope is that these requests will decrease the amount of time that you wait before being seen by our physicians.       _____________________________________________________________  Should you have questions after your visit to [facility], please contact our office at [phone] and follow the prompts.  Our office hours are 8:00 a.m. and 4:30 p.m. Monday - Friday.  Please note that voicemails left after 4:00 p.m. may not be returned until the following business day.  We are closed weekends and major holidays.  You do have access to a nurse 24-7, just call the main number to the clinic [phone] and do not press any options, hold on the line and a nurse will answer the phone.    For prescription refill  requests, have your pharmacy contact our office and allow 72 hours.    Due to Covid, you will need to wear a mask upon entering the hospital. If you do not have a mask, a mask will be given to you at the Main Entrance upon arrival. For doctor visits, patients may have 1 support person age 72 or older with them. For treatment visits, patients can not have anyone with them due to social distancing guidelines and our immunocompromised population.

## 2023-11-01 NOTE — Progress Notes (Signed)
 Patient is taking Zytiga as prescribed. He has not missed any doses and reports no side effects at this time.

## 2024-01-24 ENCOUNTER — Inpatient Hospital Stay: Attending: Hematology

## 2024-01-24 DIAGNOSIS — Z808 Family history of malignant neoplasm of other organs or systems: Secondary | ICD-10-CM | POA: Diagnosis not present

## 2024-01-24 DIAGNOSIS — R609 Edema, unspecified: Secondary | ICD-10-CM | POA: Diagnosis not present

## 2024-01-24 DIAGNOSIS — M8580 Other specified disorders of bone density and structure, unspecified site: Secondary | ICD-10-CM | POA: Diagnosis not present

## 2024-01-24 DIAGNOSIS — C61 Malignant neoplasm of prostate: Secondary | ICD-10-CM | POA: Insufficient documentation

## 2024-01-24 DIAGNOSIS — C772 Secondary and unspecified malignant neoplasm of intra-abdominal lymph nodes: Secondary | ICD-10-CM | POA: Diagnosis not present

## 2024-01-24 DIAGNOSIS — E876 Hypokalemia: Secondary | ICD-10-CM | POA: Insufficient documentation

## 2024-01-24 DIAGNOSIS — Z8042 Family history of malignant neoplasm of prostate: Secondary | ICD-10-CM | POA: Insufficient documentation

## 2024-01-24 LAB — CBC WITH DIFFERENTIAL/PLATELET
Abs Immature Granulocytes: 0.06 10*3/uL (ref 0.00–0.07)
Basophils Absolute: 0 10*3/uL (ref 0.0–0.1)
Basophils Relative: 1 %
Eosinophils Absolute: 0.1 10*3/uL (ref 0.0–0.5)
Eosinophils Relative: 1 %
HCT: 40.1 % (ref 39.0–52.0)
Hemoglobin: 13.4 g/dL (ref 13.0–17.0)
Immature Granulocytes: 1 %
Lymphocytes Relative: 20 %
Lymphs Abs: 1.7 10*3/uL (ref 0.7–4.0)
MCH: 32.9 pg (ref 26.0–34.0)
MCHC: 33.4 g/dL (ref 30.0–36.0)
MCV: 98.5 fL (ref 80.0–100.0)
Monocytes Absolute: 0.8 10*3/uL (ref 0.1–1.0)
Monocytes Relative: 9 %
Neutro Abs: 6.1 10*3/uL (ref 1.7–7.7)
Neutrophils Relative %: 68 %
Platelets: 275 10*3/uL (ref 150–400)
RBC: 4.07 MIL/uL — ABNORMAL LOW (ref 4.22–5.81)
RDW: 13.2 % (ref 11.5–15.5)
WBC: 8.8 10*3/uL (ref 4.0–10.5)
nRBC: 0 % (ref 0.0–0.2)

## 2024-01-24 LAB — COMPREHENSIVE METABOLIC PANEL WITH GFR
ALT: 8 U/L (ref 0–44)
AST: 12 U/L — ABNORMAL LOW (ref 15–41)
Albumin: 3.7 g/dL (ref 3.5–5.0)
Alkaline Phosphatase: 77 U/L (ref 38–126)
Anion gap: 11 (ref 5–15)
BUN: 19 mg/dL (ref 8–23)
CO2: 25 mmol/L (ref 22–32)
Calcium: 9.2 mg/dL (ref 8.9–10.3)
Chloride: 104 mmol/L (ref 98–111)
Creatinine, Ser: 0.77 mg/dL (ref 0.61–1.24)
GFR, Estimated: >60 mL/min (ref >60)
Glucose, Bld: 94 mg/dL (ref 70–99)
Potassium: 3.9 mmol/L (ref 3.5–5.1)
Sodium: 140 mmol/L (ref 135–145)
Total Bilirubin: 0.7 mg/dL (ref 0.0–1.2)
Total Protein: 7.1 g/dL (ref 6.5–8.1)

## 2024-01-24 LAB — PSA: Prostatic Specific Antigen: 0.06 ng/mL (ref 0.00–4.00)

## 2024-01-30 ENCOUNTER — Other Ambulatory Visit: Payer: Self-pay | Admitting: Hematology

## 2024-01-30 NOTE — Progress Notes (Signed)
 Randall [facility] [address] [place] [address]    Clinic Day:  01/31/2024  Referring physician: Rosamond Leta NOVAK, MD  Patient Care Team: Rosamond Leta NOVAK, MD as PCP - General (Internal Medicine) Debera Jayson MATSU, MD as PCP - Cardiology (Cardiology) Rogers Hai, MD as Medical Oncologist (Medical Oncology)   ASSESSMENT & PLAN:   Assessment: 1.  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  Serum testosterone on 12/17/2019 of 197. -Bone scan on 12/18/2019 at [facility] shows small focus of uptake seen left inferior orbit favoring benign etiology.  No suspicious foci of bone meta stasis. -CTAP with contrast on 12/18/2019 showed retroperitoneal and bilateral iliac chain metastatic adenopathy.  Left para-aortic lymph node measures 15 mm.  Right external iliac lymph node measures 22 x 35 mm.  Left external iliac lymph node measures 26 x 35 mm.  Ill-defined hypoenhancing 12 mm hepatic lesion, indeterminate, however concerning for metastasis.  Sclerotic focus in the right posterior ilium, indeterminate. -MRI of the abdomen on 01/10/2020 shows tiny 8 mm low-attenuation lesion in the posterior right hepatic lobe, too small to characterize.  No other significant abnormalities. -Abiraterone  and prednisone  started around 02/01/2020.  Degarelix  started on 01/29/2020. -Lupron  45 mg on 02/26/2020.   2.  Family history: -Father died of prostate cancer at age 68.  Sister had cancer on her leg resected.  Paternal uncle had cancer, patient does not know the type. - Germline mutation testing was negative.    Plan: 1.  Metastatic castration sensitive prostate cancer: - Last Eligard  45 mg was on 10/24/2023. - He is tolerating abiraterone  and prednisone  reasonably well.  He has mild hot flashes which are stable. - Reviewed labs: LFTs and creatinine are normal.  CBC was  normal.  PSA is 0.06. - PSA has been stable between 0.03-0.06 for the last couple of years. - Continue abiraterone  1000 mg daily and prednisone  5 mg daily.  RTC 3 months for follow-up at which time he will require Eligard .  After that he may be switched to every 6 months.   2.  Hypokalemia: - Continue potassium 10 mEq daily.  Potassium is normal.   3.  Fluid retention: - Continue Lasix  20 mg daily as needed.  He is not requiring on a daily basis.   4.  Osteopenia (DEXA scan 07/15/2021 T score -1.7): - Last Prolia  injection was on 10/24/2023.  Denies any dental issues.  Latest calcium is 9.2 with albumin 3.7.  He takes calcium supplements. - Will check vitamin D levels as well as repeat DEXA scan prior to next visit.    Orders Placed This Encounter  Procedures   DG Bone Density    Standing Status:   Future    Expected Date:   05/02/2024    Expiration Date:   01/30/2025    Reason for Exam (SYMPTOM  OR DIAGNOSIS REQUIRED):   osteopenia    Preferred imaging location?:   [facility]   CBC with Differential    Standing Status:   Future    Expected Date:   04/29/2024    Expiration Date:   07/28/2024   Comprehensive metabolic panel    Standing Status:   Future    Expected Date:   04/29/2024    Expiration Date:   07/28/2024   PSA    Standing Status:   Future    Expected Date:   04/29/2024  Expiration Date:   07/28/2024   VITAMIN D 25 Hydroxy (Vit-D Deficiency, Fractures)    Standing Status:   Future    Expected Date:   04/29/2024    Expiration Date:   01/30/2025      LILLETTE Hummingbird R Teague,acting as a scribe for Alean Stands, MD.,have documented all relevant documentation on the behalf of Alean Stands, MD,as directed by  Alean Stands, MD while in the presence of Alean Stands, MD.  I, Alean Stands MD, have reviewed the above documentation for accuracy and completeness, and I agree with the above.    Alean Stands, MD   7/9/202512:16 PM  CHIEF  COMPLAINT:   Diagnosis: metastatic castration sensitive prostate cancer to the retroperitoneal lymph nodes    Cancer Staging  No matching staging information was found for the patient.    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 The report date is 03/15/2020.   The Common Hereditary Cancers Panel offered by [facility] includes sequencing and/or deletion duplication testing of the following 47 genes: APC, ATM, AXIN2, BARD1, BMPR1A, BRCA1, BRCA2, BRIP1, CDH1, CDK4, CDKN2A (p14ARF), CDKN2A (p16INK4a), CHEK2, CTNNA1, DICER1, EPCAM (Deletion/duplication testing only), GREM1 (promoter region deletion/duplication testing only), KIT, MEN1, MLH1, MSH2, MSH3, MSH6, MUTYH, NBN, NF1, NTHL1, PALB2, PDGFRA, PMS2, POLD1, POLE, PTEN, RAD50, RAD51C, RAD51D, SDHB, SDHC, SDHD, SMAD4, SMARCA4. STK11, TP53, TSC1, TSC2, and VHL.  The following genes were evaluated for sequence changes only: SDHA and HOXB13 c.251G>A variant only. The Prostate Cancer HRR Panel offered by [facility] includes sequencing and/or deletion/duplication analysis of the following 10 genes: ATM, BARD1, BRCA1, BRCA2, BRIP1, CHEK2, FANCL, PALB2, RAD51C, RAD51D.      INTERVAL HISTORY:   Randall Dawson is a 63 y.o. male presenting to clinic today for follow up of metastatic castration sensitive prostate cancer to the retroperitoneal lymph nodes. He was last seen by me on 11/01/23.  Today, he states that he is doing well overall. His appetite level is at 75%. His energy level is at 40%.  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)     Surgical History: Past Surgical History:  Procedure Laterality Date   FOREIGN BODY REMOVAL N/A 07/13/2017   Procedure: FOREIGN BODY REMOVAL ADULT;  Surgeon: Terri Alan PARAS, MD;  Location: [facility] OR;  Service: ENT;  Laterality: N/A;   LEG SURGERY     rods in leg from fracture   RIGID ESOPHAGOSCOPY N/A 07/13/2017   Procedure: RIGID ESOPHAGOSCOPY;  Surgeon: Terri Alan PARAS, MD;  Location: [facility] OR;  Service: ENT;  Laterality: N/A;    Social History: Social History   Socioeconomic History   Marital status: Single    Spouse name: Not on file   Number of children: 0   Years of education: Not on file   Highest education level: Not on file  Occupational History   Occupation: EMPLOYED  Tobacco Use   Smoking status: Never   Smokeless tobacco: Never  Vaping Use   Vaping status: Never Used  Substance and Sexual Activity   Alcohol use: No   Drug use: No   Sexual activity: Not on file  Other Topics Concern   Not on file  Social History Narrative   Not on file   Social Drivers of Health   Financial Resource Strain: Medium Risk (03/15/2021)   Overall Financial Resource Strain (CARDIA)    Difficulty of Paying Living Expenses: Somewhat hard  Food Insecurity: No Food Insecurity (03/15/2021)   Hunger Vital Sign    Worried About Running Out of Food in the Last Year: Never true    Ran Out of Food in the Last Year: Never true  Transportation Needs: No Transportation Needs (03/15/2021)   PRAPARE - Administrator, Civil Service (Medical): No    Lack of Transportation (Non-Medical): No  Physical Activity: Inactive (03/15/2021)   Exercise Vital Sign    Days of Exercise per Week: 0 days    Minutes of Exercise per Session: 0 min  Stress: Stress Concern Present (03/15/2021)    [employer] of Occupational Health - Occupational Stress Questionnaire    Feeling of Stress : Rather much  Social Connections: Socially Isolated (03/15/2021)   Social Connection and Isolation Panel    Frequency of Communication with Friends and Family: Never    Frequency of Social Gatherings with Friends and Family: Never    Attends Religious Services: Never    Database administrator or Organizations: No    Attends Banker Meetings: Never    Marital Status: Divorced  Catering manager Violence: Not At Risk (03/15/2021)   Humiliation, Afraid, Rape, and Kick questionnaire    Fear of Current or Ex-Partner: No    Emotionally Abused: No    Physically Abused: No    Sexually Abused: No    Family History: Family History  Problem Relation Age of Onset   Dementia Mother    Prostate cancer Father 63       metastatic   Cancer Sister        cancer on leg, dx. in her 35s   Lymphoma Brother 80   Lung cancer Paternal Aunt        dx. >50    Current Medications:  Current Outpatient Medications:    abiraterone  acetate (ZYTIGA ) 250 MG tablet, Take 4 tablets (1,000 mg total) by mouth daily. Take on an empty stomach 1 hour before or 2 hours after a meal, Disp: 120 tablet, Rfl: 11   citalopram (CELEXA) 20 MG tablet, Take 20 mg by mouth daily., Disp: , Rfl:    furosemide  (LASIX ) 20 MG tablet, TAKE 1 TABLET(20 MG) BY MOUTH DAILY AS NEEDED, Disp: 30 tablet, Rfl: 2   HYDROcodone-acetaminophen (NORCO/VICODIN) 5-325 MG tablet, Take 1 tablet by mouth every 4 (four) hours as needed., Disp: , Rfl:    ondansetron  (ZOFRAN ) 4 MG tablet, Take 4 mg by mouth 3 (three) times daily as needed for nausea., Disp: , Rfl:    potassium chloride  (KLOR-CON ) 10 MEQ tablet, TAKE 1 TABLET(10 MEQ) BY MOUTH DAILY, Disp: 30 tablet, Rfl: 6   predniSONE  (DELTASONE ) 5 MG tablet, TAKE 1 TABLET(5 MG) BY MOUTH TWICE DAILY WITH A MEAL, Disp: 60 tablet, Rfl: 11   prochlorperazine  (COMPAZINE ) 10 MG tablet, Take 1 tablet  (10 mg total) by mouth every 6 (six) hours as needed for nausea or vomiting., Disp: 60 tablet, Rfl:  2   Allergies: No Known Allergies  REVIEW OF SYSTEMS:   Review of Systems  Constitutional:  Negative for chills, fatigue and fever.  HENT:   Negative for lump/mass, mouth sores, nosebleeds, sore throat and trouble swallowing.   Eyes:  Negative for eye problems.  Respiratory:  Negative for cough and shortness of breath.   Cardiovascular:  Negative for chest pain, leg swelling and palpitations.  Gastrointestinal:  Positive for constipation, nausea and vomiting. Negative for abdominal pain.  Genitourinary:  Negative for bladder incontinence, difficulty urinating, dysuria, frequency, hematuria and nocturia.   Musculoskeletal:  Positive for arthralgias. Negative for back pain, flank pain, myalgias and neck pain.  Skin:  Negative for itching and rash.  Neurological:  Negative for dizziness, headaches and numbness.  Hematological:  Does not bruise/bleed easily.  Psychiatric/Behavioral:  Positive for sleep disturbance. Negative for depression and suicidal ideas. The patient is not nervous/anxious.   All other systems reviewed and are negative.    VITALS:   Blood pressure 126/84, pulse 98, temperature 98.9 F (37.2 C), temperature source Tympanic, resp. rate 18, height 5' 9 (1.753 m), weight 250 lb (113.4 kg), SpO2 97%.  Wt Readings from Last 3 Encounters:  01/31/24 250 lb (113.4 kg)  11/01/23 250 lb 14.1 oz (113.8 kg)  09/03/23 252 lb 6.8 oz (114.5 kg)    Body mass index is 36.92 kg/m.  Performance status (ECOG): 1 - Symptomatic but completely ambulatory  PHYSICAL EXAM:   Physical Exam Vitals and nursing note reviewed. Exam conducted with a chaperone present.  Constitutional:      Appearance: Normal appearance.  Cardiovascular:     Rate and Rhythm: Normal rate and regular rhythm.     Pulses: Normal pulses.     Heart sounds: Normal heart sounds.  Pulmonary:     Effort: Pulmonary  effort is normal.     Breath sounds: Normal breath sounds.  Abdominal:     Palpations: Abdomen is soft. There is no hepatomegaly, splenomegaly or mass.     Tenderness: There is no abdominal tenderness.  Musculoskeletal:     Right lower leg: No edema.     Left lower leg: No edema.  Lymphadenopathy:     Cervical: No cervical adenopathy.     Right cervical: No superficial, deep or posterior cervical adenopathy.    Left cervical: No superficial, deep or posterior cervical adenopathy.     Upper Body:     Right upper body: No supraclavicular or axillary adenopathy.     Left upper body: No supraclavicular or axillary adenopathy.  Neurological:     General: No focal deficit present.     Mental Status: He is alert and oriented to person, place, and time.  Psychiatric:        Mood and Affect: Mood normal.        Behavior: Behavior normal.     LABS:   CBC     Component Value Date/Time   WBC 8.8 01/24/2024 0957   RBC 4.07 (L) 01/24/2024 0957   HGB 13.4 01/24/2024 0957   HCT 40.1 01/24/2024 0957   PLT 275 01/24/2024 0957   MCV 98.5 01/24/2024 0957   MCH 32.9 01/24/2024 0957   MCHC 33.4 01/24/2024 0957   RDW 13.2 01/24/2024 0957   LYMPHSABS 1.7 01/24/2024 0957   MONOABS 0.8 01/24/2024 0957   EOSABS 0.1 01/24/2024 0957   BASOSABS 0.0 01/24/2024 0957    CMP      Component Value Date/Time   NA 140 01/24/2024  0957   K 3.9 01/24/2024 0957   CL 104 01/24/2024 0957   CO2 25 01/24/2024 0957   GLUCOSE 94 01/24/2024 0957   BUN 19 01/24/2024 0957   CREATININE 0.77 01/24/2024 0957   CALCIUM 9.2 01/24/2024 0957   PROT 7.1 01/24/2024 0957   ALBUMIN 3.7 01/24/2024 0957   AST 12 (L) 01/24/2024 0957   ALT 8 01/24/2024 0957   ALKPHOS 77 01/24/2024 0957   BILITOT 0.7 01/24/2024 0957   GFRNONAA >60 01/24/2024 0957   GFRAA >60 04/20/2020 1017     No results found for: CEA1, CEA / No results found for: CEA1, CEA No results found for: PSA1 No results found for: CAN199 No  results found for: CAN125  No results found for: TOTALPROTELP, ALBUMINELP, A1GS, A2GS, BETS, BETA2SER, GAMS, MSPIKE, SPEI No results found for: TIBC, FERRITIN, IRONPCTSAT Lab Results  Component Value Date   LDH 167 04/20/2020   LDH 145 02/17/2020     STUDIES:   No results found.

## 2024-01-31 ENCOUNTER — Inpatient Hospital Stay (HOSPITAL_BASED_OUTPATIENT_CLINIC_OR_DEPARTMENT_OTHER): Admitting: Hematology

## 2024-01-31 VITALS — BP 126/84 | HR 98 | Temp 98.9°F | Resp 18 | Ht 69.0 in | Wt 250.0 lb

## 2024-01-31 DIAGNOSIS — C61 Malignant neoplasm of prostate: Secondary | ICD-10-CM | POA: Diagnosis not present

## 2024-01-31 DIAGNOSIS — C772 Secondary and unspecified malignant neoplasm of intra-abdominal lymph nodes: Secondary | ICD-10-CM | POA: Diagnosis not present

## 2024-01-31 NOTE — Patient Instructions (Addendum)
 [facility] at [facility] Discharge Instructions   You were seen and examined today by Dr. Rogers.  He reviewed the results of your lab work which are normal/stable.   Continue Zytiga  as prescribed.   We will see you back in 3 months. We will repeat lab work prior to this visit.    Return as scheduled.    Thank you for choosing [facility] at [facility] to provide your oncology and hematology care.  To afford each patient quality time with our provider, please arrive at least 15 minutes before your scheduled appointment time.   If you have a lab appointment with the Cancer Center please come in thru the Main Entrance and check in at the main information desk.  You need to re-schedule your appointment should you arrive 10 or more minutes late.  We strive to give you quality time with our providers, and arriving late affects you and other patients whose appointments are after yours.  Also, if you no show three or more times for appointments you may be dismissed from the clinic at the providers discretion.     Again, thank you for choosing [facility].  Our hope is that these requests will decrease the amount of time that you wait before being seen by our physicians.       _____________________________________________________________  Should you have questions after your visit to [facility], please contact our office at [phone] and follow the prompts.  Our office hours are 8:00 a.m. and 4:30 p.m. Monday - Friday.  Please note that voicemails left after 4:00 p.m. may not be returned until the following business day.  We are closed weekends and major holidays.  You do have access to a nurse 24-7, just call the main number to the clinic [phone] and do not press any options, hold on the line and a nurse will answer the phone.    For prescription refill requests, have your pharmacy contact our office and allow  72 hours.    Due to Covid, you will need to wear a mask upon entering the hospital. If you do not have a mask, a mask will be given to you at the Main Entrance upon arrival. For doctor visits, patients may have 1 support person age 26 or older with them. For treatment visits, patients can not have anyone with them due to social distancing guidelines and our immunocompromised population.

## 2024-01-31 NOTE — Progress Notes (Signed)
 Patient is taking Zytiga as prescribed. He has not missed any doses and reports no side effects at this time.

## 2024-02-02 ENCOUNTER — Ambulatory Visit (HOSPITAL_COMMUNITY)
Admission: RE | Admit: 2024-02-02 | Discharge: 2024-02-02 | Disposition: A | Discharge status: Home / Self Care | Source: Ambulatory Visit | Attending: Hematology | Admitting: Hematology

## 2024-02-02 DIAGNOSIS — C61 Malignant neoplasm of prostate: Secondary | ICD-10-CM | POA: Diagnosis present

## 2024-02-02 DIAGNOSIS — C772 Secondary and unspecified malignant neoplasm of intra-abdominal lymph nodes: Secondary | ICD-10-CM | POA: Insufficient documentation

## 2024-02-19 ENCOUNTER — Encounter (HOSPITAL_COMMUNITY): Payer: Self-pay | Admitting: Hematology

## 2024-02-19 NOTE — Progress Notes (Signed)
 Erroneous encounter

## 2024-03-07 NOTE — Progress Notes (Signed)
 Sent message, via epic in basket, requesting orders in epic from Careers adviser.

## 2024-03-08 ENCOUNTER — Ambulatory Visit: Payer: Self-pay | Admitting: Emergency Medicine

## 2024-03-08 DIAGNOSIS — G8929 Other chronic pain: Secondary | ICD-10-CM

## 2024-03-08 NOTE — H&P (Signed)
 TOTAL KNEE ADMISSION H&P  Patient is being admitted for right total knee arthroplasty.  Subjective:  Chief Complaint:right knee pain.  HPI: Randall Dawson, 63 y.o. 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  Onset of symptoms was gradual, starting >10 years ago with gradually worsening course since that time. The patient noted no past surgery on the right knee(s).  Patient currently rates pain in the right knee(s) at 8 out of 10 with activity. Patient has night pain, worsening of pain with activity and weight bearing, pain that interferes with activities of daily living, and pain with passive range of motion.  Patient has evidence of periarticular osteophytes and joint space narrowing by imaging studies.  There is no active infection.  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 Take on an empty stomach 1 hour before or 2 hours after a meal 120 tablet 11    citalopram (CELEXA) 20 MG tablet Take 20 mg by mouth daily.      furosemide  (LASIX ) 20 MG tablet TAKE 1 TABLET(20 MG) BY MOUTH DAILY AS NEEDED 30 tablet 2    HYDROcodone-acetaminophen  (NORCO/VICODIN) 5-325 MG tablet Take 1 tablet by mouth every 4 (four) hours as needed.      ondansetron  (ZOFRAN ) 4 MG tablet Take 4 mg by mouth 3 (three) times daily as needed for nausea.      potassium chloride  (KLOR-CON ) 10 MEQ tablet TAKE 1 TABLET(10 MEQ) BY MOUTH DAILY 30 tablet 6    predniSONE  (DELTASONE ) 5 MG tablet TAKE 1 TABLET(5 MG) BY MOUTH TWICE DAILY WITH A MEAL 60 tablet 11    prochlorperazine  (COMPAZINE ) 10 MG tablet Take 1 tablet (10 mg total) by mouth every 6 (six) hours as needed for nausea or vomiting. 60 tablet 2    No current facility-administered medications for this visit.   No Known Allergies  Social History   Tobacco Use   Smoking status: Never   Smokeless tobacco: Never  Substance Use Topics   Alcohol  use: No    Family History  Problem Relation Age of Onset  Dementia Mother    Prostate cancer Father 45       metastatic   Cancer Sister        cancer on leg, dx. in her 39s   Lymphoma Brother 78   Lung cancer Paternal Aunt        dx. >50     Review of Systems  Musculoskeletal:  Positive for arthralgias.  All other systems reviewed and are negative.   Objective:  Physical Exam Constitutional:      General: He is not in acute distress.    Appearance: Normal appearance. He is normal weight.  HENT:     Head: Normocephalic and atraumatic.  Eyes:     Extraocular Movements: Extraocular movements intact.     Conjunctiva/sclera: Conjunctivae  normal.     Pupils: Pupils are equal, round, and reactive to light.  Cardiovascular:     Rate and Rhythm: Normal rate and regular rhythm.     Pulses: Normal pulses.     Heart sounds: Normal heart sounds.  Pulmonary:     Effort: Pulmonary effort is normal. No respiratory distress.     Breath sounds: Normal breath sounds.  Abdominal:     General: Bowel sounds are normal. There is no distension.     Palpations: Abdomen is soft.     Tenderness: There is no abdominal tenderness.  Musculoskeletal:        General: Tenderness present.     Cervical back: Normal range of motion and neck supple.     Comments: TTP over medial and lateral joint line, medial worse than lateral.  No calf tenderness, swelling, or erythema.  No overlying lesions of area of chief complaint.  Decreased strength and ROM due to elicited pain.  Pre-operative ROM 5-115.  Dorsiflexion and plantarflexion intact.  Stable to varus and valgus stress.  Pitting edema 2+ RLE, 1+ LLE at baseline.  1+ PT of BLE.  Otherwise BLE appear grossly neurovascularly intact.  Gait mildly antalgic.   Lymphadenopathy:     Cervical: No cervical adenopathy.  Skin:    General: Skin is warm and dry.     Capillary Refill: Capillary refill takes less than 2 seconds.     Findings: No erythema or rash.  Neurological:     General: No focal deficit present.     Mental Status: He is alert and oriented to person, place, and time.  Psychiatric:        Mood and Affect: Mood normal.        Behavior: Behavior normal.     Vital signs in last 24 hours: @VSRANGES @  Labs:   Estimated body mass index is 36.92 kg/m as calculated from the following:   Height as of 01/31/24: 5' 9 (1.753 m).   Weight as of 01/31/24: 113.4 kg.   Imaging Review Plain radiographs demonstrate severe degenerative joint disease of the right knee(s). The overall alignment isgenerally neutral with some subluxation, medial worse than lateral. The bone quality appears to be fair for  age and reported activity level.      Assessment/Plan:  End stage arthritis, right knee   The patient history, physical examination, clinical judgment of the provider and imaging studies are consistent with end stage degenerative joint disease of the right knee(s) and total knee arthroplasty is deemed medically necessary. The treatment options including medical management, injection therapy arthroscopy and arthroplasty were discussed at length. The risks and benefits of total knee arthroplasty were presented and reviewed. The risks due to aseptic loosening,  infection, stiffness, patella tracking problems, thromboembolic complications and other imponderables were discussed. The patient acknowledged the explanation, agreed to proceed with the plan and consent was signed. Patient is being admitted for inpatient treatment for surgery, pain control, PT, OT, prophylactic antibiotics, VTE prophylaxis, progressive ambulation and ADL's and discharge planning. The patient is planning to be discharged home with OPPT     Patient's anticipated LOS is less than 2 midnights, meeting these requirements: - Younger than 58 - Lives within 1 hour of care - Has a competent adult at home to recover with post-op recover - NO history of  - Chronic pain requiring opiods  - Diabetes  - Coronary Artery Disease  - Heart failure  - Heart attack  - Stroke  - DVT/VTE  - Cardiac arrhythmia  - Respiratory Failure/COPD  - Renal failure  - Anemia  - Advanced Liver disease

## 2024-03-08 NOTE — H&P (View-Only) (Signed)
 TOTAL KNEE ADMISSION H&P  Patient is being admitted for right total knee arthroplasty.  Subjective:  Chief Complaint:right knee pain.  HPI: Berman Grainger Furness, 63 y.o. 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  Onset of symptoms was gradual, starting >10 years ago with gradually worsening course since that time. The patient noted no past surgery on the right knee(s).  Patient currently rates pain in the right knee(s) at 8 out of 10 with activity. Patient has night pain, worsening of pain with activity and weight bearing, pain that interferes with activities of daily living, and pain with passive range of motion.  Patient has evidence of periarticular osteophytes and joint space narrowing by imaging studies.  There is no active infection.  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 Take on an empty stomach 1 hour before or 2 hours after a meal 120 tablet 11    citalopram (CELEXA) 20 MG tablet Take 20 mg by mouth daily.      furosemide  (LASIX ) 20 MG tablet TAKE 1 TABLET(20 MG) BY MOUTH DAILY AS NEEDED 30 tablet 2    HYDROcodone-acetaminophen  (NORCO/VICODIN) 5-325 MG tablet Take 1 tablet by mouth every 4 (four) hours as needed.      ondansetron  (ZOFRAN ) 4 MG tablet Take 4 mg by mouth 3 (three) times daily as needed for nausea.      potassium chloride  (KLOR-CON ) 10 MEQ tablet TAKE 1 TABLET(10 MEQ) BY MOUTH DAILY 30 tablet 6    predniSONE  (DELTASONE ) 5 MG tablet TAKE 1 TABLET(5 MG) BY MOUTH TWICE DAILY WITH A MEAL 60 tablet 11    prochlorperazine  (COMPAZINE ) 10 MG tablet Take 1 tablet (10 mg total) by mouth every 6 (six) hours as needed for nausea or vomiting. 60 tablet 2    No current facility-administered medications for this visit.   No Known Allergies  Social History   Tobacco Use   Smoking status: Never   Smokeless tobacco: Never  Substance Use Topics   Alcohol  use: No    Family History  Problem Relation Age of Onset  Dementia Mother    Prostate cancer Father 45       metastatic   Cancer Sister        cancer on leg, dx. in her 39s   Lymphoma Brother 78   Lung cancer Paternal Aunt        dx. >50     Review of Systems  Musculoskeletal:  Positive for arthralgias.  All other systems reviewed and are negative.   Objective:  Physical Exam Constitutional:      General: He is not in acute distress.    Appearance: Normal appearance. He is normal weight.  HENT:     Head: Normocephalic and atraumatic.  Eyes:     Extraocular Movements: Extraocular movements intact.     Conjunctiva/sclera: Conjunctivae  normal.     Pupils: Pupils are equal, round, and reactive to light.  Cardiovascular:     Rate and Rhythm: Normal rate and regular rhythm.     Pulses: Normal pulses.     Heart sounds: Normal heart sounds.  Pulmonary:     Effort: Pulmonary effort is normal. No respiratory distress.     Breath sounds: Normal breath sounds.  Abdominal:     General: Bowel sounds are normal. There is no distension.     Palpations: Abdomen is soft.     Tenderness: There is no abdominal tenderness.  Musculoskeletal:        General: Tenderness present.     Cervical back: Normal range of motion and neck supple.     Comments: TTP over medial and lateral joint line, medial worse than lateral.  No calf tenderness, swelling, or erythema.  No overlying lesions of area of chief complaint.  Decreased strength and ROM due to elicited pain.  Pre-operative ROM 5-115.  Dorsiflexion and plantarflexion intact.  Stable to varus and valgus stress.  Pitting edema 2+ RLE, 1+ LLE at baseline.  1+ PT of BLE.  Otherwise BLE appear grossly neurovascularly intact.  Gait mildly antalgic.   Lymphadenopathy:     Cervical: No cervical adenopathy.  Skin:    General: Skin is warm and dry.     Capillary Refill: Capillary refill takes less than 2 seconds.     Findings: No erythema or rash.  Neurological:     General: No focal deficit present.     Mental Status: He is alert and oriented to person, place, and time.  Psychiatric:        Mood and Affect: Mood normal.        Behavior: Behavior normal.     Vital signs in last 24 hours: @VSRANGES @  Labs:   Estimated body mass index is 36.92 kg/m as calculated from the following:   Height as of 01/31/24: 5' 9 (1.753 m).   Weight as of 01/31/24: 113.4 kg.   Imaging Review Plain radiographs demonstrate severe degenerative joint disease of the right knee(s). The overall alignment isgenerally neutral with some subluxation, medial worse than lateral. The bone quality appears to be fair for  age and reported activity level.      Assessment/Plan:  End stage arthritis, right knee   The patient history, physical examination, clinical judgment of the provider and imaging studies are consistent with end stage degenerative joint disease of the right knee(s) and total knee arthroplasty is deemed medically necessary. The treatment options including medical management, injection therapy arthroscopy and arthroplasty were discussed at length. The risks and benefits of total knee arthroplasty were presented and reviewed. The risks due to aseptic loosening,  infection, stiffness, patella tracking problems, thromboembolic complications and other imponderables were discussed. The patient acknowledged the explanation, agreed to proceed with the plan and consent was signed. Patient is being admitted for inpatient treatment for surgery, pain control, PT, OT, prophylactic antibiotics, VTE prophylaxis, progressive ambulation and ADL's and discharge planning. The patient is planning to be discharged home with OPPT     Patient's anticipated LOS is less than 2 midnights, meeting these requirements: - Younger than 58 - Lives within 1 hour of care - Has a competent adult at home to recover with post-op recover - NO history of  - Chronic pain requiring opiods  - Diabetes  - Coronary Artery Disease  - Heart failure  - Heart attack  - Stroke  - DVT/VTE  - Cardiac arrhythmia  - Respiratory Failure/COPD  - Renal failure  - Anemia  - Advanced Liver disease

## 2024-03-12 NOTE — Patient Instructions (Addendum)
 SURGICAL WAITING ROOM VISITATION Patients having surgery or a procedure may have no more than 2 support people in the waiting area - these visitors may rotate.    Children under the age of 29 must have an adult with them who is not the patient.  If the patient needs to stay at the hospital during part of their recovery, the visitor guidelines for inpatient rooms apply. Pre-op nurse will coordinate an appropriate time for 1 support person to accompany patient in pre-op.  This support person may not rotate.    Please refer to the [facility] website for the visitor guidelines for Inpatients (after your surgery is over and you are in a regular room).       Your procedure is scheduled on: 03-20-24   Report to [facility] Main Entrance    Report to admitting at 5:15 AM   Call this number if you have problems the morning of surgery [phone]   Do not eat food :After Midnight.   After Midnight you may have the following liquids until 4:30 AM DAY OF SURGERY  Water Non-Citrus Juices (without pulp, NO RED-Apple, White grape, White cranberry) Black Coffee (NO MILK/CREAM OR CREAMERS, sugar ok)  Clear Tea (NO MILK/CREAM OR CREAMERS, sugar ok) regular and decaf                             Plain Jell-O (NO RED)                                           Fruit ices (not with fruit pulp, NO RED)                                     Popsicles (NO RED)                                                               Sports drinks like Gatorade (NO RED)                   The day of surgery:  Drink ONE (1) Pre-Surgery Clear Ensure  by 4:30 AM the morning of surgery. Drink in one sitting. Do not sip.  This drink was given to you during your hospital  pre-op appointment visit. Nothing else to drink after completing the Pre-Surgery Clear Ensure .          If you have questions, please contact your surgeon's office.   FOLLOW  ANY ADDITIONAL PRE OP INSTRUCTIONS YOU RECEIVED FROM YOUR  SURGEON'S OFFICE!!!     Oral Hygiene is also important to reduce your risk of infection.                                    Remember - BRUSH YOUR TEETH THE MORNING OF SURGERY WITH YOUR REGULAR TOOTHPASTE   Do NOT smoke after Midnight   Take these medicines the morning of surgery with A SIP OF WATER:    Hydrocodone if  needed  Stop all vitamins and herbal supplements 7 days before surgery                              You may not have any metal on your body including  jewelry, and body piercing             Do not wear lotions, powders, cologne, or deodorant              Men may shave face and neck.   Do not bring valuables to the hospital. [facility] IS NOT RESPONSIBLE   FOR VALUABLES.   Contacts, dentures or bridgework may not be worn into surgery.  DO NOT BRING YOUR HOME MEDICATIONS TO THE HOSPITAL. PHARMACY WILL DISPENSE MEDICATIONS LISTED ON YOUR MEDICATION LIST TO YOU DURING YOUR ADMISSION IN THE HOSPITAL!    Patients discharged on the day of surgery will not be allowed to drive home.  Someone NEEDS to stay with you for the first 24 hours after anesthesia.              Please read over the following fact sheets you were given: IF YOU HAVE QUESTIONS ABOUT YOUR PRE-OP INSTRUCTIONS PLEASE CALL [phone] Gwen  If you received a COVID test during your pre-op visit  it is requested that you wear a mask when out in public, stay away from anyone that may not be feeling well and notify your surgeon if you develop symptoms. If you test positive for Covid or have been in contact with anyone that has tested positive in the last 10 days please notify you surgeon.   Pre-operative 5 CHG Bath Instructions   You can play a key role in reducing the risk of infection after surgery. Your skin needs to be as free of germs as possible. You can reduce the number of germs on your skin by washing with CHG (chlorhexidine gluconate) soap before surgery. CHG is an antiseptic soap that kills germs and  continues to kill germs even after washing.   DO NOT use if you have an allergy to chlorhexidine/CHG or antibacterial soaps. If your skin becomes reddened or irritated, stop using the CHG and notify one of our RNs at [phone].   Please shower with the CHG soap starting 4 days before surgery using the following schedule:     Please keep in mind the following:  DO NOT shave, including legs and underarms, starting the day of your first shower.   You may shave your face at any point before/day of surgery.  Place clean sheets on your bed the day you start using CHG soap. Use a clean washcloth (not used since being washed) for each shower. DO NOT sleep with pets once you start using the CHG.   CHG Shower Instructions:  If you choose to wash your hair and private area, wash first with your normal shampoo/soap.  After you use shampoo/soap, rinse your hair and body thoroughly to remove shampoo/soap residue.  Turn the water OFF and apply about 3 tablespoons (45 ml) of CHG soap to a CLEAN washcloth.  Apply CHG soap ONLY FROM YOUR NECK DOWN TO YOUR TOES (washing for 3-5 minutes)  DO NOT use CHG soap on face, private areas, open wounds, or sores.  Pay special attention to the area where your surgery is being performed.  If you are having back surgery, having someone wash your back for you may be helpful. Wait 2  minutes after CHG soap is applied, then you may rinse off the CHG soap.  Pat dry with a clean towel  Put on clean clothes/pajamas   If you choose to wear lotion, please use ONLY the CHG-compatible lotions on the back of this paper.     Additional instructions for the day of surgery: DO NOT APPLY any lotions, deodorants, cologne, or perfumes.   Put on clean/comfortable clothes.  Brush your teeth.  Ask your nurse before applying any prescription medications to the skin.      CHG Compatible Lotions   Aveeno Moisturizing lotion  Cetaphil Moisturizing Cream  Cetaphil Moisturizing  Lotion  Clairol Herbal Essence Moisturizing Lotion, Dry Skin  Clairol Herbal Essence Moisturizing Lotion, Extra Dry Skin  Clairol Herbal Essence Moisturizing Lotion, Normal Skin  Curel Age Defying Therapeutic Moisturizing Lotion with Alpha Hydroxy  Curel Extreme Care Body Lotion  Curel Soothing Hands Moisturizing Hand Lotion  Curel Therapeutic Moisturizing Cream, Fragrance-Free  Curel Therapeutic Moisturizing Lotion, Fragrance-Free  Curel Therapeutic Moisturizing Lotion, Original Formula  Eucerin Daily Replenishing Lotion  Eucerin Dry Skin Therapy Plus Alpha Hydroxy Crme  Eucerin Dry Skin Therapy Plus Alpha Hydroxy Lotion  Eucerin Original Crme  Eucerin Original Lotion  Eucerin Plus Crme Eucerin Plus Lotion  Eucerin TriLipid Replenishing Lotion  Keri Anti-Bacterial Hand Lotion  Keri Deep Conditioning Original Lotion Dry Skin Formula Softly Scented  Keri Deep Conditioning Original Lotion, Fragrance Free Sensitive Skin Formula  Keri Lotion Fast Absorbing Fragrance Free Sensitive Skin Formula  Keri Lotion Fast Absorbing Softly Scented Dry Skin Formula  Keri Original Lotion  Keri Skin Renewal Lotion Keri Silky Smooth Lotion  Keri Silky Smooth Sensitive Skin Lotion  Nivea Body Creamy Conditioning Oil  Nivea Body Extra Enriched Lotion  Nivea Body Original Lotion  Nivea Body Sheer Moisturizing Lotion Nivea Crme  Nivea Skin Firming Lotion  NutraDerm 30 Skin Lotion  NutraDerm Skin Lotion  NutraDerm Therapeutic Skin Cream  NutraDerm Therapeutic Skin Lotion  ProShield Protective Hand Cream  Provon moisturizing lotion   PATIENT SIGNATURE_________________________________  NURSE SIGNATURE__________________________________  ________________________________________________________________________    Nasario Exon  An incentive spirometer is a tool that can help keep your lungs clear and active. This tool measures how well you are filling your lungs with each breath. Taking  long deep breaths may help reverse or decrease the chance of developing breathing (pulmonary) problems (especially infection) following: A long period of time when you are unable to move or be active. BEFORE THE PROCEDURE  If the spirometer includes an indicator to show your best effort, your nurse or respiratory therapist will set it to a desired goal. If possible, sit up straight or lean slightly forward. Try not to slouch. Hold the incentive spirometer in an upright position. INSTRUCTIONS FOR USE  Sit on the edge of your bed if possible, or sit up as far as you can in bed or on a chair. Hold the incentive spirometer in an upright position. Breathe out normally. Place the mouthpiece in your mouth and seal your lips tightly around it. Breathe in slowly and as deeply as possible, raising the piston or the ball toward the top of the column. Hold your breath for 3-5 seconds or for as long as possible. Allow the piston or ball to fall to the bottom of the column. Remove the mouthpiece from your mouth and breathe out normally. Rest for a few seconds and repeat Steps 1 through 7 at least 10 times every 1-2 hours when you are awake. Take your  time and take a few normal breaths between deep breaths. The spirometer may include an indicator to show your best effort. Use the indicator as a goal to work toward during each repetition. After each set of 10 deep breaths, practice coughing to be sure your lungs are clear. If you have an incision (the cut made at the time of surgery), support your incision when coughing by placing a pillow or rolled up towels firmly against it. Once you are able to get out of bed, walk around indoors and cough well. You may stop using the incentive spirometer when instructed by your caregiver.  RISKS AND COMPLICATIONS Take your time so you do not get dizzy or light-headed. If you are in pain, you may need to take or ask for pain medication before doing incentive spirometry. It  is harder to take a deep breath if you are having pain. AFTER USE Rest and breathe slowly and easily. It can be helpful to keep track of a log of your progress. Your caregiver can provide you with a simple table to help with this. If you are using the spirometer at home, follow these instructions: SEEK MEDICAL CARE IF:  You are having difficultly using the spirometer. You have trouble using the spirometer as often as instructed. Your pain medication is not giving enough relief while using the spirometer. You develop fever of 100.5 F (38.1 C) or higher. SEEK IMMEDIATE MEDICAL CARE IF:  You cough up bloody sputum that had not been present before. You develop fever of 102 F (38.9 C) or greater. You develop worsening pain at or near the incision site. MAKE SURE YOU:  Understand these instructions. Will watch your condition. Will get help right away if you are not doing well or get worse. Document Released: 11/21/2006 Document Revised: 10/03/2011 Document Reviewed: 01/22/2007 [facility] Patient Information 2014 [place], [place].   WHAT IS A BLOOD TRANSFUSION? Blood Transfusion Information  A transfusion is the replacement of blood or some of its parts. Blood is made up of multiple cells which provide different functions. Red blood cells carry oxygen and are used for blood loss replacement. White blood cells fight against infection. Platelets control bleeding. Plasma helps clot blood. Other blood products are available for specialized needs, such as hemophilia or other clotting disorders. BEFORE THE TRANSFUSION  Who gives blood for transfusions?  Healthy volunteers who are fully evaluated to make sure their blood is safe. This is blood bank blood. Transfusion therapy is the safest it has ever been in the practice of medicine. Before blood is taken from a donor, a complete history is taken to make sure that person has no history of diseases nor engages in risky social behavior (examples  are intravenous drug use or sexual activity with multiple partners). The donor's travel history is screened to minimize risk of transmitting infections, such as malaria. The donated blood is tested for signs of infectious diseases, such as HIV and hepatitis. The blood is then tested to be sure it is compatible with you in order to minimize the chance of a transfusion reaction. If you or a relative donates blood, this is often done in anticipation of surgery and is not appropriate for emergency situations. It takes many days to process the donated blood. RISKS AND COMPLICATIONS Although transfusion therapy is very safe and saves many lives, the main dangers of transfusion include:  Getting an infectious disease. Developing a transfusion reaction. This is an allergic reaction to something in the blood you were given.  Every precaution is taken to prevent this. The decision to have a blood transfusion has been considered carefully by your caregiver before blood is given. Blood is not given unless the benefits outweigh the risks. AFTER THE TRANSFUSION Right after receiving a blood transfusion, you will usually feel much better and more energetic. This is especially true if your red blood cells have gotten low (anemic). The transfusion raises the level of the red blood cells which carry oxygen, and this usually causes an energy increase. The nurse administering the transfusion will monitor you carefully for complications. HOME CARE INSTRUCTIONS  No special instructions are needed after a transfusion. You may find your energy is better. Speak with your caregiver about any limitations on activity for underlying diseases you may have. SEEK MEDICAL CARE IF:  Your condition is not improving after your transfusion. You develop redness or irritation at the intravenous (IV) site. SEEK IMMEDIATE MEDICAL CARE IF:  Any of the following symptoms occur over the next 12 hours: Shaking chills. You have a temperature by  mouth above 102 F (38.9 C), not controlled by medicine. Chest, back, or muscle pain. People around you feel you are not acting correctly or are confused. Shortness of breath or difficulty breathing. Dizziness and fainting. You get a rash or develop hives. You have a decrease in urine output. Your urine turns a dark color or changes to pink, red, or brown. Any of the following symptoms occur over the next 10 days: You have a temperature by mouth above 102 F (38.9 C), not controlled by medicine. Shortness of breath. Weakness after normal activity. The white part of the eye turns yellow (jaundice). You have a decrease in the amount of urine or are urinating less often. Your urine turns a dark color or changes to pink, red, or brown. Document Released: 07/08/2000 Document Revised: 10/03/2011 Document Reviewed: 02/25/2008 [facility] Patient Information 2014 [place], [place].  _______________________________________________________________________

## 2024-03-12 NOTE — Progress Notes (Signed)
  Date of COVID positive in last 90 days:  No  PCP - Leta Fear, MD Cardiologist - Sheppard Sierras, MD (last OV 2021)  Chest x-ray - 1V 09-03-23 Epic EKG - 09-04-23 Epic Stress Test - N/A ECHO - N/A Cardiac Cath - N/A Pacemaker/ICD device last checked:N/A Spinal Cord Stimulator:N/A  Bowel Prep - N/A  Sleep Study - N/A CPAP -   Fasting Blood Sugar - N/A Checks Blood Sugar _____ times a day  Last dose of GLP1 agonist-  N/A GLP1 instructions:  Do not take after     Last dose of SGLT-2 inhibitors-  N/A SGLT-2 instructions:  Do not take after    Blood Thinner Instructions: N/A Last dose:   Time: Aspirin Instructions:N/A Last Dose:  Activity level:  Difficulty climibing stairs due knee pain.  Able to perform activities of daily living without stopping and without symptoms of chest pain or shortness of breath.  Anesthesia review: Hx of recurrent chest pain evaluated by cardiology.  Patient states no recent episodes of chest pain.  Patient denies shortness of breath, fever, cough and chest pain at PAT appointment  Patient verbalized understanding of instructions that were given to them at the PAT appointment. Patient was also instructed that they will need to review over the PAT instructions again at home before surgery.

## 2024-03-14 ENCOUNTER — Encounter (HOSPITAL_COMMUNITY)
Admission: RE | Admit: 2024-03-14 | Discharge: 2024-03-14 | Disposition: A | Discharge status: Home / Self Care | Source: Ambulatory Visit | Attending: Orthopedic Surgery | Admitting: Orthopedic Surgery

## 2024-03-14 ENCOUNTER — Other Ambulatory Visit: Payer: Self-pay

## 2024-03-14 ENCOUNTER — Other Ambulatory Visit: Payer: Self-pay | Admitting: *Deleted

## 2024-03-14 ENCOUNTER — Encounter (HOSPITAL_COMMUNITY): Payer: Self-pay

## 2024-03-14 VITALS — BP 132/99 | HR 93 | Temp 98.5°F | Resp 16 | Ht 69.0 in | Wt 242.0 lb

## 2024-03-14 DIAGNOSIS — Z8546 Personal history of malignant neoplasm of prostate: Secondary | ICD-10-CM | POA: Diagnosis not present

## 2024-03-14 DIAGNOSIS — Z01818 Encounter for other preprocedural examination: Secondary | ICD-10-CM

## 2024-03-14 DIAGNOSIS — G8929 Other chronic pain: Secondary | ICD-10-CM | POA: Diagnosis not present

## 2024-03-14 DIAGNOSIS — Z01812 Encounter for preprocedural laboratory examination: Secondary | ICD-10-CM | POA: Diagnosis not present

## 2024-03-14 DIAGNOSIS — M1711 Unilateral primary osteoarthritis, right knee: Secondary | ICD-10-CM | POA: Insufficient documentation

## 2024-03-14 DIAGNOSIS — M25561 Pain in right knee: Secondary | ICD-10-CM | POA: Diagnosis not present

## 2024-03-14 DIAGNOSIS — C61 Malignant neoplasm of prostate: Secondary | ICD-10-CM

## 2024-03-14 HISTORY — DX: Unspecified viral hepatitis B without hepatic coma: B19.10

## 2024-03-14 HISTORY — DX: Unspecified osteoarthritis, unspecified site: M19.90

## 2024-03-14 HISTORY — DX: Personal history of urinary calculi: Z87.442

## 2024-03-14 LAB — COMPREHENSIVE METABOLIC PANEL WITH GFR
ALT: 8 U/L (ref 0–44)
AST: 12 U/L — ABNORMAL LOW (ref 15–41)
Albumin: 3.6 g/dL (ref 3.5–5.0)
Alkaline Phosphatase: 74 U/L (ref 38–126)
Anion gap: 8 (ref 5–15)
BUN: 18 mg/dL (ref 8–23)
CO2: 25 mmol/L (ref 22–32)
Calcium: 9.2 mg/dL (ref 8.9–10.3)
Chloride: 108 mmol/L (ref 98–111)
Creatinine, Ser: 0.91 mg/dL (ref 0.61–1.24)
GFR, Estimated: >60 mL/min (ref >60)
Glucose, Bld: 103 mg/dL — ABNORMAL HIGH (ref 70–99)
Potassium: 4.3 mmol/L (ref 3.5–5.1)
Sodium: 141 mmol/L (ref 135–145)
Total Bilirubin: 0.9 mg/dL (ref 0.0–1.2)
Total Protein: 7.6 g/dL (ref 6.5–8.1)

## 2024-03-14 LAB — CBC WITH DIFFERENTIAL/PLATELET
Abs Immature Granulocytes: 0.09 K/uL — ABNORMAL HIGH (ref 0.00–0.07)
Basophils Absolute: 0.1 K/uL (ref 0.0–0.1)
Basophils Relative: 1 %
Eosinophils Absolute: 0.1 K/uL (ref 0.0–0.5)
Eosinophils Relative: 0 %
HCT: 41.5 % (ref 39.0–52.0)
Hemoglobin: 13.2 g/dL (ref 13.0–17.0)
Immature Granulocytes: 1 %
Lymphocytes Relative: 11 %
Lymphs Abs: 1.4 K/uL (ref 0.7–4.0)
MCH: 31.9 pg (ref 26.0–34.0)
MCHC: 31.8 g/dL (ref 30.0–36.0)
MCV: 100.2 fL — ABNORMAL HIGH (ref 80.0–100.0)
Monocytes Absolute: 1 K/uL (ref 0.1–1.0)
Monocytes Relative: 8 %
Neutro Abs: 10.4 K/uL — ABNORMAL HIGH (ref 1.7–7.7)
Neutrophils Relative %: 79 %
Platelets: 289 K/uL (ref 150–400)
RBC: 4.14 MIL/uL — ABNORMAL LOW (ref 4.22–5.81)
RDW: 13 % (ref 11.5–15.5)
WBC: 13 K/uL — ABNORMAL HIGH (ref 4.0–10.5)
nRBC: 0 % (ref 0.0–0.2)

## 2024-03-14 LAB — SURGICAL PCR SCREEN
MRSA, PCR: NEGATIVE
Staphylococcus aureus: NEGATIVE

## 2024-03-14 MED ORDER — PREDNISONE 5 MG PO TABS: 5.0000 mg | ORAL_TABLET | Freq: Two times a day (BID) | ORAL | 11 refills | Status: AC

## 2024-03-18 ENCOUNTER — Encounter (HOSPITAL_COMMUNITY): Payer: Self-pay

## 2024-03-18 NOTE — Progress Notes (Signed)
 Case: [id number] Date/Time: 03/20/24 0715   Procedure: ARTHROPLASTY, KNEE, TOTAL (Right: Knee)   Anesthesia type: Spinal   Pre-op diagnosis: OA RIGHT KNEE   Location: [facility] ROOM 10 / [facility]   Surgeons: Edna Toribio LABOR, MD       DISCUSSION: Randall Dawson is a 63 yo male with PMH of metastatic prostate cancer.  Pt with prior hx of chest pain. Seen by Cardiology in 2021. A stress test was ordered for further evaluate but it appears it never completed (notes indicate due to test not being approved by insurance) and patient has since been lost to follow up. He does follow with his Oncologist Dr. Rogers regularly. No recent hx of chest pain. At PAT visit pt reports he has difficulty climibing stairs due knee pain.  But is able to perform activities of daily living without stopping and without symptoms of chest pain or shortness of breath.  VS: BP (!) 132/99   Pulse 93   Temp 36.9 C (Oral)   Resp 16   Ht 5' 9 (1.753 m)   Wt 109.8 kg   SpO2 99%   BMI 35.74 kg/m   PROVIDERS: Rosamond Leta NOVAK, MD   LABS: Labs reviewed: Acceptable for surgery. (all labs ordered are listed, but only abnormal results are displayed)  Labs Reviewed  CBC WITH DIFFERENTIAL/PLATELET - Abnormal; Notable for the following components:      Result Value   WBC 13.0 (*)    RBC 4.14 (*)    MCV 100.2 (*)    Neutro Abs 10.4 (*)    Abs Immature Granulocytes 0.09 (*)    All other components within normal limits  COMPREHENSIVE METABOLIC PANEL WITH GFR - Abnormal; Notable for the following components:   Glucose, Bld 103 (*)    AST 12 (*)    All other components within normal limits  SURGICAL PCR SCREEN  TYPE AND SCREEN     IMAGES:   EKG 09/03/23:  Sinus tachycardia, rate 106 Otherwise normal   CV:  Past Medical History:  Diagnosis Date   Arthritis    Family history of lung cancer    Family history of lymphoma    Family history of prostate cancer    Hepatitis B    History of hepatitis B  01/14/2020   History of kidney stones    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)     Past Surgical History:  Procedure Laterality Date   FOREIGN BODY REMOVAL N/A 07/13/2017   Procedure: FOREIGN BODY REMOVAL ADULT;  Surgeon: Terri Alan PARAS, MD;  Location: [facility];  Service: ENT;  Laterality: N/A;   LEG SURGERY     rods in leg from fracture   PROSTATE BIOPSY     RIGID ESOPHAGOSCOPY N/A 07/13/2017   Procedure: RIGID ESOPHAGOSCOPY;  Surgeon: Terri Alan PARAS, MD;  Location: [facility];  Service: ENT;  Laterality: N/A;    MEDICATIONS:  abiraterone  acetate (ZYTIGA ) 250 MG tablet   bisacodyl (DULCOLAX) 5 MG EC tablet   Calcium Carbonate (CALCIUM 600 PO)   furosemide  (LASIX ) 20 MG tablet   HYDROcodone-acetaminophen  (NORCO) 10-325 MG tablet   potassium chloride  (KLOR-CON ) 10 MEQ tablet   predniSONE  (DELTASONE ) 5 MG tablet   No current facility-administered medications for this encounter.    Burnard CHRISTELLA Odis DEVONNA [facility] Short Stay/Anesthesiology [facility] Phone [phone] 03/18/2024 1:33 PM

## 2024-03-18 NOTE — Anesthesia Preprocedure Evaluation (Signed)
 Anesthesia Evaluation  Patient identified by MRN, date of birth, ID band Patient awake    Reviewed: Allergy & Precautions, NPO status , Patient's Chart, lab work & pertinent test results  History of Anesthesia Complications Negative for: history of anesthetic complications  Airway Mallampati: I  TM Distance: >3 FB Neck ROM: Full    Dental no notable dental hx. (+) Dental Advisory Given, Edentulous Upper, Edentulous Lower   Pulmonary    Pulmonary exam normal breath sounds clear to auscultation       Cardiovascular (-) hypertension(-) angina (-) Past MI Normal cardiovascular exam Rhythm:Regular Rate:Normal     Neuro/Psych    GI/Hepatic ,,,(+) Hepatitis - (2021), B  Endo/Other  negative endocrine ROS    Renal/GU    Prostate CA    Musculoskeletal  (+) Arthritis , Osteoarthritis,    Abdominal   Peds  Hematology Lab Results      Component                Value               Date                      WBC                      13.0 (H)            03/14/2024                HGB                      13.2                03/14/2024                HCT                      41.5                03/14/2024                MCV                      100.2 (H)           03/14/2024                PLT                      289                 03/14/2024              Anesthesia Other Findings NKDA  Reproductive/Obstetrics                              Anesthesia Physical Anesthesia Plan  ASA: 3  Anesthesia Plan: Spinal and Regional   Post-op Pain Management: Regional block* and Tylenol  PO (pre-op)*   Induction: Intravenous  PONV Risk Score and Plan: 2 and Treatment may vary due to age or medical condition, Midazolam  and Propofol  infusion  Airway Management Planned: Natural Airway  Additional Equipment: None  Intra-op Plan:   Post-operative Plan:   Informed Consent:      Dental advisory  given  Plan Discussed with: CRNA and Surgeon  Anesthesia Plan Comments: (See PAT note  from 8/21  Spinal W R adductor canal)         Anesthesia Quick Evaluation

## 2024-03-20 ENCOUNTER — Ambulatory Visit (HOSPITAL_COMMUNITY)

## 2024-03-20 ENCOUNTER — Ambulatory Visit (HOSPITAL_COMMUNITY)
Admission: RE | Admit: 2024-03-20 | Discharge: 2024-03-20 | Disposition: A | Discharge status: Home / Self Care | Attending: Orthopedic Surgery | Admitting: Orthopedic Surgery

## 2024-03-20 ENCOUNTER — Ambulatory Visit (HOSPITAL_COMMUNITY): Payer: Self-pay | Admitting: Medical

## 2024-03-20 ENCOUNTER — Encounter (HOSPITAL_COMMUNITY): Payer: Self-pay | Admitting: Orthopedic Surgery

## 2024-03-20 ENCOUNTER — Ambulatory Visit (HOSPITAL_BASED_OUTPATIENT_CLINIC_OR_DEPARTMENT_OTHER): Payer: Self-pay | Admitting: Anesthesiology

## 2024-03-20 ENCOUNTER — Other Ambulatory Visit: Payer: Self-pay

## 2024-03-20 ENCOUNTER — Encounter (HOSPITAL_COMMUNITY)
Admission: RE | Disposition: A | Discharge status: Home / Self Care | Payer: Self-pay | Source: Home / Self Care | Attending: Orthopedic Surgery

## 2024-03-20 DIAGNOSIS — M1711 Unilateral primary osteoarthritis, right knee: Secondary | ICD-10-CM

## 2024-03-20 DIAGNOSIS — Z8546 Personal history of malignant neoplasm of prostate: Secondary | ICD-10-CM | POA: Insufficient documentation

## 2024-03-20 DIAGNOSIS — B1910 Unspecified viral hepatitis B without hepatic coma: Secondary | ICD-10-CM | POA: Insufficient documentation

## 2024-03-20 HISTORY — PX: TOTAL KNEE ARTHROPLASTY: SHX125

## 2024-03-20 LAB — TYPE AND SCREEN
ABO/RH(D): O POS
Antibody Screen: NEGATIVE

## 2024-03-20 LAB — ABO/RH: ABO/RH(D): O POS

## 2024-03-20 SURGERY — ARTHROPLASTY, KNEE, TOTAL
Anesthesia: Regional | Site: Knee | Laterality: Right

## 2024-03-20 MED ORDER — BUPIVACAINE-EPINEPHRINE (PF) 0.25% -1:200000 IJ SOLN
INTRAMUSCULAR | Status: AC
  Filled 2024-03-20: qty 30

## 2024-03-20 MED ORDER — LACTATED RINGERS IV SOLN: INTRAVENOUS | Status: DC

## 2024-03-20 MED ORDER — SODIUM CHLORIDE 0.9 % IV SOLN: INTRAVENOUS | Status: DC

## 2024-03-20 MED ORDER — METHOCARBAMOL 500 MG PO TABS
ORAL_TABLET | ORAL | Status: AC
  Filled 2024-03-20: qty 1

## 2024-03-20 MED ORDER — DEXAMETHASONE SODIUM PHOSPHATE 10 MG/ML IJ SOLN
INTRAMUSCULAR | Status: AC
  Filled 2024-03-20: qty 1

## 2024-03-20 MED ORDER — SODIUM CHLORIDE 0.9 % IR SOLN
Status: DC | PRN
  Administered 2024-03-20 (×2): 1000 mL

## 2024-03-20 MED ORDER — ROPIVACAINE HCL 5 MG/ML IJ SOLN
INTRAMUSCULAR | Status: DC | PRN
Start: 2024-03-20 — End: 2024-03-20
  Administered 2024-03-20: 25 mL via PERINEURAL

## 2024-03-20 MED ORDER — MIDAZOLAM HCL 2 MG/2ML IJ SOLN
INTRAMUSCULAR | Status: DC | PRN
  Administered 2024-03-20: 2 mg via INTRAVENOUS

## 2024-03-20 MED ORDER — METHOCARBAMOL 500 MG PO TABS
500.0000 mg | ORAL_TABLET | Freq: Four times a day (QID) | ORAL | Status: DC | PRN
Start: 2024-03-20 — End: 2024-03-20
  Administered 2024-03-20: 500 mg via ORAL

## 2024-03-20 MED ORDER — CLONIDINE HCL (ANALGESIA) 100 MCG/ML EP SOLN
EPIDURAL | Status: DC | PRN
  Administered 2024-03-20: 100 ug

## 2024-03-20 MED ORDER — LACTATED RINGERS IV BOLUS: 250.0000 mL | Freq: Once | INTRAVENOUS | Status: DC

## 2024-03-20 MED ORDER — ONDANSETRON HCL 4 MG PO TABS: 4.0000 mg | ORAL_TABLET | Freq: Four times a day (QID) | ORAL | Status: DC | PRN

## 2024-03-20 MED ORDER — HYDROMORPHONE HCL 1 MG/ML IJ SOLN: 0.2500 mg | INTRAMUSCULAR | Status: DC | PRN

## 2024-03-20 MED ORDER — PROPOFOL 1000 MG/100ML IV EMUL
INTRAVENOUS | Status: AC
  Filled 2024-03-20: qty 100

## 2024-03-20 MED ORDER — PHENYLEPHRINE HCL-NACL 20-0.9 MG/250ML-% IV SOLN
INTRAVENOUS | Status: DC | PRN
  Administered 2024-03-20: 20 ug/min via INTRAVENOUS

## 2024-03-20 MED ORDER — ACETAMINOPHEN 500 MG PO TABS: 1000.0000 mg | ORAL_TABLET | Freq: Three times a day (TID) | ORAL | Status: AC | PRN

## 2024-03-20 MED ORDER — ORAL CARE MOUTH RINSE: 15.0000 mL | Freq: Once | OROMUCOSAL | Status: AC

## 2024-03-20 MED ORDER — ACETAMINOPHEN 10 MG/ML IV SOLN: 1000.0000 mg | Freq: Once | INTRAVENOUS | Status: DC | PRN

## 2024-03-20 MED ORDER — ISOPROPYL ALCOHOL 70 % SOLN
Status: DC | PRN
  Administered 2024-03-20: 1 via TOPICAL

## 2024-03-20 MED ORDER — CEFAZOLIN SODIUM-DEXTROSE 2-4 GM/100ML-% IV SOLN
2.0000 g | Freq: Four times a day (QID) | INTRAVENOUS | Status: DC
  Administered 2024-03-20: 2 g via INTRAVENOUS

## 2024-03-20 MED ORDER — SODIUM CHLORIDE (PF) 0.9 % IJ SOLN
INTRAMUSCULAR | Status: AC
  Filled 2024-03-20: qty 30

## 2024-03-20 MED ORDER — OXYCODONE HCL 5 MG PO TABS
5.0000 mg | ORAL_TABLET | ORAL | Status: DC | PRN
  Administered 2024-03-20: 10 mg via ORAL

## 2024-03-20 MED ORDER — BUPIVACAINE LIPOSOME 1.3 % IJ SUSP
INTRAMUSCULAR | Status: DC | PRN
  Administered 2024-03-20: 20 mL

## 2024-03-20 MED ORDER — BUPIVACAINE LIPOSOME 1.3 % IJ SUSP
INTRAMUSCULAR | Status: AC
  Filled 2024-03-20: qty 20

## 2024-03-20 MED ORDER — ACETAMINOPHEN 500 MG PO TABS
1000.0000 mg | ORAL_TABLET | Freq: Once | ORAL | Status: AC
  Administered 2024-03-20: 1000 mg via ORAL
  Filled 2024-03-20: qty 2

## 2024-03-20 MED ORDER — 0.9 % SODIUM CHLORIDE (POUR BTL) OPTIME
TOPICAL | Status: DC | PRN
  Administered 2024-03-20: 1000 mL

## 2024-03-20 MED ORDER — WATER FOR IRRIGATION, STERILE IR SOLN
Status: DC | PRN
  Administered 2024-03-20: 2000 mL

## 2024-03-20 MED ORDER — ONDANSETRON HCL 4 MG/2ML IJ SOLN: 4.0000 mg | Freq: Once | INTRAMUSCULAR | Status: DC | PRN

## 2024-03-20 MED ORDER — BUPIVACAINE IN DEXTROSE 0.75-8.25 % IT SOLN
INTRATHECAL | Status: DC | PRN
  Administered 2024-03-20: 12 mg via INTRATHECAL

## 2024-03-20 MED ORDER — FENTANYL CITRATE (PF) 100 MCG/2ML IJ SOLN
INTRAMUSCULAR | Status: DC | PRN
  Administered 2024-03-20: 100 ug via INTRAVENOUS

## 2024-03-20 MED ORDER — BUPIVACAINE LIPOSOME 1.3 % IJ SUSP: 20.0000 mL | Freq: Once | INTRAMUSCULAR | Status: DC

## 2024-03-20 MED ORDER — OXYCODONE HCL 5 MG PO TABS: 5.0000 mg | ORAL_TABLET | ORAL | 0 refills | Status: AC | PRN

## 2024-03-20 MED ORDER — APIXABAN 2.5 MG PO TABS: 2.5000 mg | ORAL_TABLET | Freq: Two times a day (BID) | ORAL | 0 refills | Status: AC

## 2024-03-20 MED ORDER — OXYCODONE HCL 5 MG/5ML PO SOLN: 5.0000 mg | Freq: Once | ORAL | Status: DC | PRN

## 2024-03-20 MED ORDER — LACTATED RINGERS IV BOLUS
500.0000 mL | Freq: Once | INTRAVENOUS | Status: AC
  Administered 2024-03-20: 500 mL via INTRAVENOUS

## 2024-03-20 MED ORDER — METHOCARBAMOL 500 MG PO TABS: 500.0000 mg | ORAL_TABLET | Freq: Three times a day (TID) | ORAL | 0 refills | Status: AC | PRN

## 2024-03-20 MED ORDER — POVIDONE-IODINE 10 % EX SWAB
2.0000 | Freq: Once | CUTANEOUS | Status: AC
  Administered 2024-03-20: 2 via TOPICAL

## 2024-03-20 MED ORDER — OXYCODONE HCL 5 MG PO TABS
ORAL_TABLET | ORAL | Status: AC
  Filled 2024-03-20: qty 2

## 2024-03-20 MED ORDER — ONDANSETRON HCL 4 MG PO TABS: 4.0000 mg | ORAL_TABLET | Freq: Three times a day (TID) | ORAL | 0 refills | Status: AC | PRN

## 2024-03-20 MED ORDER — ONDANSETRON HCL 4 MG/2ML IJ SOLN
INTRAMUSCULAR | Status: AC
  Filled 2024-03-20: qty 2

## 2024-03-20 MED ORDER — CEFAZOLIN SODIUM-DEXTROSE 2-4 GM/100ML-% IV SOLN
2.0000 g | INTRAVENOUS | Status: AC
  Administered 2024-03-20: 2 g via INTRAVENOUS
  Filled 2024-03-20: qty 100

## 2024-03-20 MED ORDER — BUPIVACAINE-EPINEPHRINE 0.25% -1:200000 IJ SOLN
INTRAMUSCULAR | Status: DC | PRN
  Administered 2024-03-20: 30 mL

## 2024-03-20 MED ORDER — FENTANYL CITRATE (PF) 100 MCG/2ML IJ SOLN
INTRAMUSCULAR | Status: AC
  Filled 2024-03-20: qty 2

## 2024-03-20 MED ORDER — SODIUM CHLORIDE 0.9% FLUSH
INTRAVENOUS | Status: DC | PRN
  Administered 2024-03-20: 30 mL

## 2024-03-20 MED ORDER — MIDAZOLAM HCL 2 MG/2ML IJ SOLN
INTRAMUSCULAR | Status: AC
  Filled 2024-03-20: qty 2

## 2024-03-20 MED ORDER — ONDANSETRON HCL 4 MG/2ML IJ SOLN
INTRAMUSCULAR | Status: DC | PRN
  Administered 2024-03-20: 4 mg via INTRAVENOUS

## 2024-03-20 MED ORDER — OXYCODONE HCL 5 MG PO TABS: 5.0000 mg | ORAL_TABLET | Freq: Once | ORAL | Status: DC | PRN

## 2024-03-20 MED ORDER — AMISULPRIDE (ANTIEMETIC) 5 MG/2ML IV SOLN: 10.0000 mg | Freq: Once | INTRAVENOUS | Status: DC | PRN

## 2024-03-20 MED ORDER — TRANEXAMIC ACID-NACL 1000-0.7 MG/100ML-% IV SOLN
1000.0000 mg | INTRAVENOUS | Status: AC
  Administered 2024-03-20: 1000 mg via INTRAVENOUS
  Filled 2024-03-20: qty 100

## 2024-03-20 MED ORDER — METHOCARBAMOL 1000 MG/10ML IJ SOLN: 500.0000 mg | Freq: Four times a day (QID) | INTRAMUSCULAR | Status: DC | PRN

## 2024-03-20 MED ORDER — LIDOCAINE HCL (PF) 2 % IJ SOLN
INTRAMUSCULAR | Status: AC
  Filled 2024-03-20: qty 5

## 2024-03-20 MED ORDER — HYDROMORPHONE HCL 1 MG/ML IJ SOLN: 0.5000 mg | INTRAMUSCULAR | Status: DC | PRN

## 2024-03-20 MED ORDER — DEXAMETHASONE SODIUM PHOSPHATE 10 MG/ML IJ SOLN
8.0000 mg | Freq: Once | INTRAMUSCULAR | Status: AC
  Administered 2024-03-20: 8 mg via INTRAVENOUS

## 2024-03-20 MED ORDER — PROPOFOL 500 MG/50ML IV EMUL
INTRAVENOUS | Status: DC | PRN
Start: 2024-03-20 — End: 2024-03-20
  Administered 2024-03-20: 35 ug/kg/min via INTRAVENOUS

## 2024-03-20 MED ORDER — ONDANSETRON HCL 4 MG/2ML IJ SOLN: 4.0000 mg | Freq: Four times a day (QID) | INTRAMUSCULAR | Status: DC | PRN

## 2024-03-20 MED ORDER — CEFAZOLIN SODIUM-DEXTROSE 2-4 GM/100ML-% IV SOLN
INTRAVENOUS | Status: AC
  Filled 2024-03-20: qty 100

## 2024-03-20 MED ORDER — CHLORHEXIDINE GLUCONATE 0.12 % MT SOLN
15.0000 mL | Freq: Once | OROMUCOSAL | Status: AC
  Administered 2024-03-20: 15 mL via OROMUCOSAL

## 2024-03-20 MED ORDER — CELECOXIB 100 MG PO CAPS: 100.0000 mg | ORAL_CAPSULE | Freq: Two times a day (BID) | ORAL | 0 refills | Status: AC

## 2024-03-20 SURGICAL SUPPLY — 50 items
BAG COUNTER SPONGE SURGICOUNT (BAG) IMPLANT
BLADE SAG 18X100X1.27 (BLADE) ×1 IMPLANT
BLADE SAW SAG 35X64 .89 (BLADE) ×1 IMPLANT
BLADE SAW SGTL 11.0X1.19X90.0M (BLADE) IMPLANT
BNDG COHESIVE 3X5 TAN ST LF (GAUZE/BANDAGES/DRESSINGS) ×1 IMPLANT
BNDG ELASTIC 6X10 VLCR STRL LF (GAUZE/BANDAGES/DRESSINGS) ×1 IMPLANT
BOWL SMART MIX CTS (DISPOSABLE) IMPLANT
CHLORAPREP W/TINT 26 (MISCELLANEOUS) ×2 IMPLANT
COMPONENT FEM CMT PS STD 11 RT (Joint) IMPLANT
COMPONENT PATELLA 3 PEG 38 (Joint) IMPLANT
COMPONENT TIB KNEE PS G 0 RT (Joint) IMPLANT
COVER SURGICAL LIGHT HANDLE (MISCELLANEOUS) ×1 IMPLANT
CUFF TRNQT CYL 34X4.125X (TOURNIQUET CUFF) ×1 IMPLANT
DERMABOND ADVANCED .7 DNX12 (GAUZE/BANDAGES/DRESSINGS) ×1 IMPLANT
DRAPE INCISE IOBAN 85X60 (DRAPES) ×1 IMPLANT
DRAPE SHEET LG 3/4 BI-LAMINATE (DRAPES) ×1 IMPLANT
DRAPE U-SHAPE 47X51 STRL (DRAPES) ×1 IMPLANT
DRSG AQUACEL AG ADV 3.5X10 (GAUZE/BANDAGES/DRESSINGS) ×1 IMPLANT
ELECT REM PT RETURN 15FT ADLT (MISCELLANEOUS) ×1 IMPLANT
GAUZE SPONGE 4X4 12PLY STRL (GAUZE/BANDAGES/DRESSINGS) ×1 IMPLANT
GLOVE BIO SURGEON STRL SZ 6.5 (GLOVE) ×2 IMPLANT
GLOVE BIOGEL PI IND STRL 6.5 (GLOVE) ×1 IMPLANT
GLOVE BIOGEL PI IND STRL 8 (GLOVE) ×1 IMPLANT
GLOVE SURG ORTHO 8.0 STRL STRW (GLOVE) ×2 IMPLANT
GOWN STRL REUS W/ TWL XL LVL3 (GOWN DISPOSABLE) ×2 IMPLANT
HOLDER FOLEY CATH W/STRAP (MISCELLANEOUS) ×1 IMPLANT
HOOD PEEL AWAY T7 (MISCELLANEOUS) ×3 IMPLANT
INSERT TIBIAL PERSONA 13 RT (Insert) IMPLANT
KIT TURNOVER KIT A (KITS) ×1 IMPLANT
MANIFOLD NEPTUNE II (INSTRUMENTS) ×1 IMPLANT
MARKER SKIN DUAL TIP RULER LAB (MISCELLANEOUS) ×1 IMPLANT
NS IRRIG 1000ML POUR BTL (IV SOLUTION) ×1 IMPLANT
PACK TOTAL KNEE CUSTOM (KITS) ×1 IMPLANT
PENCIL SMOKE EVACUATOR (MISCELLANEOUS) ×1 IMPLANT
PIN DRILL HDLS TROCAR 75 4PK (PIN) IMPLANT
SCREW HEADED 33MM KNEE (MISCELLANEOUS) IMPLANT
SET HNDPC FAN SPRY TIP SCT (DISPOSABLE) ×1 IMPLANT
SOLUTION IRRIG SURGIPHOR (IV SOLUTION) IMPLANT
SOLUTION PRONTOSAN WOUND 350ML (IRRIGATION / IRRIGATOR) IMPLANT
STRIP CLOSURE SKIN 1/2X4 (GAUZE/BANDAGES/DRESSINGS) ×1 IMPLANT
SUT MNCRL AB 3-0 PS2 18 (SUTURE) ×1 IMPLANT
SUT STRATAFIX 14 PDO 48 VLT (SUTURE) ×1 IMPLANT
SUT VIC AB 2-0 CT1 TAPERPNT 27 (SUTURE) IMPLANT
SUT VIC AB 2-0 CT2 27 (SUTURE) ×2 IMPLANT
SUTURE STRATFX 0 PDS 27 VIOLET (SUTURE) ×1 IMPLANT
SYR 50ML LL SCALE MARK (SYRINGE) ×1 IMPLANT
TRAY FOLEY MTR SLVR 14FR STAT (SET/KITS/TRAYS/PACK) IMPLANT
TUBE SUCTION HIGH CAP CLEAR NV (SUCTIONS) ×1 IMPLANT
UNDERPAD 30X36 HEAVY ABSORB (UNDERPADS AND DIAPERS) ×1 IMPLANT
WRAP KNEE MAXI GEL POST OP (GAUZE/BANDAGES/DRESSINGS) ×1 IMPLANT

## 2024-03-20 NOTE — Anesthesia Postprocedure Evaluation (Signed)
 Anesthesia Post Note  Patient: Randall Dawson  Procedure(s) Performed: ARTHROPLASTY, KNEE, TOTAL (Right: Knee)     Patient location during evaluation: Nursing Unit Anesthesia Type: Regional and Spinal Level of consciousness: oriented and awake and alert Pain management: pain level controlled Vital Signs Assessment: post-procedure vital signs reviewed and stable Respiratory status: spontaneous breathing and respiratory function stable Cardiovascular status: blood pressure returned to baseline and stable Postop Assessment: no headache, no backache, no apparent nausea or vomiting and patient able to bend at knees Anesthetic complications: no   No notable events documented.  Last Vitals:  Vitals:   03/20/24 1045 03/20/24 1100  BP: 123/84 116/89  Pulse: 78 82  Resp: 14 16  Temp: (!) 36.1 C 36.6 C  SpO2: 100% 97%    Last Pain:  Vitals:   03/20/24 1135  TempSrc:   PainSc: 8                  [name]

## 2024-03-20 NOTE — Anesthesia Procedure Notes (Signed)
 Anesthesia Regional Block: Adductor canal block   Pre-Anesthetic Checklist: , timeout performed,  Correct Patient, Correct Site, Correct Laterality,  Correct Procedure, Correct Position, site marked,  Risks and benefits discussed,  Surgical consent,  Pre-op evaluation,  At surgeon's request and post-op pain management  Laterality: Lower and Right  Prep: chloraprep       Needles:  Injection technique: Single-shot  Needle Type: Echogenic Needle     Needle Length: 9cm  Needle Gauge: 22     Additional Needles:   Procedures:,,,, ultrasound used (permanent image in chart),,    Narrative:  Start time: 03/20/2024 6:54 AM End time: 03/20/2024 6:58 AM Injection made incrementally with aspirations every 5 mL.  Performed by: Personally  Anesthesiologist: Jefm Garnette LABOR, MD  Additional Notes: Block assessed prior to surgery. Pt tolerated procedure well.

## 2024-03-20 NOTE — Interval H&P Note (Signed)

## 2024-03-20 NOTE — Anesthesia Procedure Notes (Signed)
 Spinal  Patient location during procedure: OR Start time: 03/20/2024 7:21 AM End time: 03/20/2024 7:25 AM Reason for block: surgical anesthesia Staffing Performed: anesthesiologist  Anesthesiologist: Jefm Garnette LABOR, MD Performed by: Jefm Garnette LABOR, MD Authorized by: Jefm Garnette LABOR, MD   Preanesthetic Checklist Completed: patient identified, IV checked, risks and benefits discussed, surgical consent, monitors and equipment checked, pre-op evaluation and timeout performed Spinal Block Patient position: sitting Prep: DuraPrep and site prepped and draped Patient monitoring: heart rate, cardiac monitor, continuous pulse ox and blood pressure Approach: midline Location: L3-4 Injection technique: single-shot Needle Needle type: Pencan  Needle gauge: 24 G Needle length: 10 cm Needle insertion depth: 5 cm Assessment Sensory level: T4 Events: CSF return Additional Notes  1 Attempt (s). Pt tolerated procedure well.

## 2024-03-20 NOTE — Transfer of Care (Signed)
 Immediate Anesthesia Transfer of Care Note  Patient: Randall Dawson  Procedure(s) Performed: ARTHROPLASTY, KNEE, TOTAL (Right: Knee)  Patient Location: PACU  Anesthesia Type:Spinal  Level of Consciousness: drowsy  Airway & Oxygen Therapy: Patient Spontanous Breathing and Patient connected to face mask oxygen  Post-op Assessment: Report given to RN and Post -op Vital signs reviewed and stable  Post vital signs: Reviewed and stable  Last Vitals:  Vitals Value Taken Time  BP 106/72 03/20/24 09:35  Temp    Pulse 67 03/20/24 09:37  Resp 10 03/20/24 09:37  SpO2 100 % 03/20/24 09:37  Vitals shown include unfiled device data.  Last Pain:  Vitals:   03/20/24 0615  TempSrc:   PainSc: 0-No pain         Complications: No notable events documented.

## 2024-03-20 NOTE — Progress Notes (Signed)
 Orthopedic Tech Progress Note Patient Details:  Jhace Fennell Piedra 07-17-1961 [id number]  Ortho Devices Type of Ortho Device: Bone foam zero knee Ortho Device/Splint Location: right Ortho Device/Splint Interventions: Ordered, Application, Adjustment   Post Interventions Patient Tolerated: Well Instructions Provided: Adjustment of device, Care of device  Waylan Thom Loving 03/20/2024, 9:51 AM

## 2024-03-20 NOTE — Op Note (Signed)
 DATE OF SURGERY:  03/20/2024 TIME: 9:16 AM  PATIENT NAME:  Randall Dawson   AGE: 63 y.o.    PRE-OPERATIVE DIAGNOSIS: End-stage right knee osteoarthritis  POST-OPERATIVE DIAGNOSIS:  Same  PROCEDURE: Press-fit right total Knee Arthroplasty  SURGEON:  Leliana Kontz A Chyrl Elwell, MD   ASSISTANT: Abigail Brinks, PA-C, present and scrubbed throughout the case, critical for assistance with exposure, retraction, instrumentation, and closure.   OPERATIVE IMPLANTS:  Zimmer persona press-fit size 11 standard CR femur, size G right tibial baseplate, 13 mm MC poly, 38 mm press-fit patella button Implant Name Type Inv. Item Serial No. Manufacturer Lot No. LRB No. Used Action  COMPONENT TIB KNEE PS G 0 RT - [id number] Joint COMPONENT TIB KNEE PS G 0 RT  [employer](ORTH,TRAU,BIO,SG) [id number] Right 1 Implanted  COMPONENT FEM CMT PS STD 11 RT - [id number] Joint COMPONENT FEM CMT PS STD 11 RT  [employer](ORTH,TRAU,BIO,SG) [id number] Right 1 Implanted  INSERT TIBIAL PERSONA 13 RT - [id number] Insert INSERT TIBIAL PERSONA 13 RT  [employer](ORTH,TRAU,BIO,SG) [id number] Right 1 Implanted  COMPONENT PATELLA 3 PEG 38 - [id number] Joint COMPONENT PATELLA 3 PEG 38  [employer]IM[employer](ORTH,TRAU,BIO,SG) [id number] Right 1 Implanted      PREOPERATIVE INDICATIONS:  Randall Dawson is a 63 y.o. year old male with end stage bone on bone degenerative arthritis of the knee who failed conservative treatment, including injections, antiinflammatories, activity modification, and assistive devices, and had significant impairment of their activities of daily living, and elected for Total Knee Arthroplasty.   The risks, benefits, and alternatives were discussed at length including but not limited to the risks of infection, bleeding, nerve injury, stiffness, blood clots, the need for revision surgery, cardiopulmonary complications, among others, and they were willing to proceed.  OPERATIVE FINDINGS AND UNIQUE ASPECTS  OF THE CASE: Chronic ACL deficiency with MCL laxity able to get stable knee with a 13 mm MC poly  ESTIMATED BLOOD LOSS: 50cc  OPERATIVE DESCRIPTION:   Once adequate anesthesia was induced, preoperative antibiotics, 2 gm of ancef ,1 gm of Tranexamic Acid , and 8 mg of Decadron  administered, the patient was positioned supine with a right thigh tourniquet placed.  The right lower extremity was prepped and draped in sterile fashion.  A time-  out was performed identifying the patient, planned procedure, and the appropriate extremity.     The leg was  exsanguinated, tourniquet elevated to 250 mmHg.  A midline incision was  made followed by median parapatellar arthrotomy. Anterior horn of the medial meniscus was released and resected. A medial release was performed, the infrapatellar fat pad was resected with care taken to protect the patellar tendon. The suprapatellar fat was removed to exposed the distal anterior femur. The anterior horn of the lateral meniscus and ACL were released.    Following initial  exposure, I first started with the femur  The femoral  canal was opened with a drill, canal was suctioned to try to prevent fat emboli.  An  intramedullary rod was passed set at 6 degrees valgus, 10 mm. The distal femur was resected.  Following this resection, the tibia was  subluxated anteriorly.  Using the extramedullary guide, 10 mm of bone was resected off   the proximal lateral tibia.  We confirmed the gap would be  stable medially and laterally with a size 10mm spacer block as well as confirmed that the tibial cut was perpendicular in the coronal plane, checking with an alignment rod.    Once this was done, the posterior femoral  referencing femoral sizer was placed under to the posterior condyles with 3 degrees of external rotational which was parallel to the transepicondylar axis and perpendicular to [employer]. The femur was sized to be a size 11 in the anterior-  posterior dimension. The   anterior, posterior, and  chamfer cuts were made without difficulty nor   notching making certain that I was along the anterior cortex to help  with flexion gap stability. Next a laminar spreader was placed with the knee in flexion and the medial lateral menisci were resected.  5 cc of the Exparel  mixture was injected in the medial side of the back of the knee and 3 cc in the lateral side.  1/2 inch curved osteotome was used to resect posterior osteophyte that was then removed with a pituitary rongeur.       At this point, the tibia was sized to be a size G.  The size G tray was  then pinned in position. Trial reduction was now carried with a 11 femur, G tibia, a 10 mm MC insert.  Given some hyperextension and medial mall lateral laxity as well as flexion laxity upsize the liner of total graft to a 13 mm at which point felt we had full 0 degree extension with better varus valgus stability. .  The knee was slightly tight in flexion and the PCL was partially released.   Attention was next directed to the patella.  Precut  measurement was noted to be 26 mm.  I resected down to 16 mm and used a  38mm patellar button to restore patellar height as well as cover the cut surface.     The patella lug holes were drilled and a 38mm patella poly trial was placed.    The knee was brought to full extension with good flexion stability with the patella tracking through the trochlea without application of pressure.    Next the femoral component was again assessed and determined to be seated and appropriately lateralized.  The femoral lug holes were drilled.  The femoral component was then removed. Tibial component was again assessed and felt to be seated and appropriately rotated with the medial third of the tubercle. The tibia was then drilled, and keel punched.     Final components were  opened and impacted into place.   The knee was irrigated with sterile Betadine  diluted in saline as well as pulse lavage normal  saline. The synovial lining was  then injected a dilute Exparel  with 30cc of 0.25% marcaine  with epinephrine .     I confirmed that I was satisfied with the range of motion and stability, and the final 13 mm MC poly insert was chosen.  It was placed into the knee.         The tourniquet had been let down at 60 minutes.  No significant hemostasis was required.  The medial parapatellar arthrotomy was then reapproximated using #1 Stratafix sutures with the knee  in flexion.  The remaining wound was closed with 0 stratafix, 2-0 Vicryl, and running 3-0 Monocryl. The knee was cleaned, dried, dressed sterilely using Dermabond and   Aquacel dressing.  The patient was then brought to recovery room in stable condition, tolerating the procedure  well. There were no complications.   Post op recs: WB: WBAT Abx: ancef  Imaging: PACU xrays DVT prophylaxis: Eliquis  2.5 mg twice daily starting postop day 1 x 4 weeks given history of prostate cancer Follow up: 2 weeks after surgery for a wound  check with Dr. Edna at [facility].  Address: [address], [place], [place] [address]  Office Phone: [phone]  Toribio Edna, MD Orthopaedic Surgery

## 2024-03-20 NOTE — Discharge Instructions (Signed)
 INSTRUCTIONS AFTER JOINT REPLACEMENT   Remove items at home which could result in a fall. This includes throw rugs or furniture in walking pathways ICE to the affected joint every three hours while awake for 30 minutes at a time, for at least the first 3-5 days, and then as needed for pain and swelling.  Continue to use ice for pain and swelling. You may notice swelling that will progress down to the foot and ankle.  This is normal after surgery.  Elevate your leg when you are not up walking on it.   Continue to use the breathing machine you got in the hospital (incentive spirometer) which will help keep your temperature down.  It is common for your temperature to cycle up and down following surgery, especially at night when you are not up moving around and exerting yourself.  The breathing machine keeps your lungs expanded and your temperature down.   DIET:  As you were doing prior to hospitalization, we recommend a well-balanced diet.  DRESSING / WOUND CARE / SHOWERING  Keep the surgical dressing until follow up.  The dressing is water  proof, so you can shower without any extra covering.  IF THE DRESSING FALLS OFF or the wound gets wet inside, change the dressing with sterile gauze.  Please use good hand washing techniques before changing the dressing.  Do not use any lotions or creams on the incision until instructed by your surgeon.    ACTIVITY  Increase activity slowly as tolerated, but follow the weight bearing instructions below.   No driving for 6 weeks or until further direction given by your physician.  You cannot drive while taking narcotics.  No lifting or carrying greater than 10 lbs. until further directed by your surgeon. Avoid periods of inactivity such as sitting longer than an hour when not asleep. This helps prevent blood clots.  You may return to work once you are authorized by your doctor.     WEIGHT BEARING   Weight bearing as tolerated with assist device (walker, cane,  etc) as directed, use it as long as suggested by your surgeon or therapist, typically at least 4-6 weeks.   EXERCISES  Results after joint replacement surgery are often greatly improved when you follow the exercise, range of motion and muscle strengthening exercises prescribed by your doctor. Safety measures are also important to protect the joint from further injury. Any time any of these exercises cause you to have increased pain or swelling, decrease what you are doing until you are comfortable again and then slowly increase them. If you have problems or questions, call your caregiver or physical therapist for advice.   Rehabilitation is important following a joint replacement. After just a few days of immobilization, the muscles of the leg can become weakened and shrink (atrophy).  These exercises are designed to build up the tone and strength of the thigh and leg muscles and to improve motion. Often times heat used for twenty to thirty minutes before working out will loosen up your tissues and help with improving the range of motion but do not use heat for the first two weeks following surgery (sometimes heat can increase post-operative swelling).   These exercises can be done on a training (exercise) mat, on the floor, on a table or on a bed. Use whatever works the best and is most comfortable for you.    Use music or television while you are exercising so that the exercises are a pleasant break in your  day. This will make your life better with the exercises acting as a break in your routine that you can look forward to.   Perform all exercises about fifteen times, three times per day or as directed.  You should exercise both the operative leg and the other leg as well.  Exercises include:   Quad Sets - Tighten up the muscle on the front of the thigh (Quad) and hold for 5-10 seconds.   Straight Leg Raises - With your knee straight (if you were given a brace, keep it on), lift the leg to 60  degrees, hold for 3 seconds, and slowly lower the leg.  Perform this exercise against resistance later as your leg gets stronger.  Leg Slides: Lying on your back, slowly slide your foot toward your buttocks, bending your knee up off the floor (only go as far as is comfortable). Then slowly slide your foot back down until your leg is flat on the floor again.  Angel Wings: Lying on your back spread your legs to the side as far apart as you can without causing discomfort.  Hamstring Strength:  Lying on your back, push your heel against the floor with your leg straight by tightening up the muscles of your buttocks.  Repeat, but this time bend your knee to a comfortable angle, and push your heel against the floor.  You may put a pillow under the heel to make it more comfortable if necessary.   A rehabilitation program following joint replacement surgery can speed recovery and prevent re-injury in the future due to weakened muscles. Contact your doctor or a physical therapist for more information on knee rehabilitation.    CONSTIPATION  Constipation is defined medically as fewer than three stools per week and severe constipation as less than one stool per week.  Even if you have a regular bowel pattern at home, your normal regimen is likely to be disrupted due to multiple reasons following surgery.  Combination of anesthesia, postoperative narcotics, change in appetite and fluid intake all can affect your bowels.   YOU MUST use at least one of the following options; they are listed in order of increasing strength to get the job done.  They are all available over the counter, and you may need to use some, POSSIBLY even all of these options:    Drink plenty of fluids (prune juice may be helpful) and high fiber foods Colace 100 mg by mouth twice a day  Senokot for constipation as directed and as needed Dulcolax (bisacodyl), take with full glass of water   Miralax (polyethylene glycol) once or twice a day as  needed.  If you have tried all these things and are unable to have a bowel movement in the first 3-4 days after surgery call either your surgeon or your primary doctor.    If you experience loose stools or diarrhea, hold the medications until you stool forms back up.  If your symptoms do not get better within 1 week or if they get worse, check with your doctor.  If you experience the worst abdominal pain ever or develop nausea or vomiting, please contact the office immediately for further recommendations for treatment.   ITCHING:  If you experience itching with your medications, try taking only a single pain pill, or even half a pain pill at a time.  You can also use Benadryl over the counter for itching or also to help with sleep.   TED HOSE STOCKINGS:  Use stockings on both  legs until for at least 2 weeks or as directed by physician office. They may be removed at night for sleeping.  MEDICATIONS:  See your medication summary on the "After Visit Summary" that nursing will review with you.  You may have some home medications which will be placed on hold until you complete the course of blood thinner medication.  It is important for you to complete the blood thinner medication as prescribed.   Blood clot prevention (DVT Prophylaxis): After surgery you are at an increased risk for a blood clot. you were prescribed a blood thinner, Eliquis  2.5mg , to be taken twice daily for a total of 4 weeks from surgery to help reduce your risk of getting a blood clot. This will help prevent a blood clot. Signs of a pulmonary embolus (blood clot in the lungs) include sudden short of breath, feeling lightheaded or dizzy, chest pain with a deep breath, rapid pulse rapid breathing. Signs of a blood clot in your arms or legs include new unexplained swelling and cramping, warm, red or darkened skin around the painful area. Please call the office or 911 right away if these signs or symptoms develop.  PRECAUTIONS:  If you  experience chest pain or shortness of breath - call 911 immediately for transfer to the hospital emergency department.   If you develop a fever greater that 101 F, purulent drainage from wound, increased redness or drainage from wound, foul odor from the wound/dressing, or calf pain - CONTACT YOUR SURGEON.                                                   FOLLOW-UP APPOINTMENTS:  If you do not already have a post-op appointment, please call the office for an appointment to be seen by your surgeon.  Guidelines for how soon to be seen are listed in your "After Visit Summary", but are typically between 2-3 weeks after surgery.  POST-OPERATIVE OPIOID TAPER INSTRUCTIONS: It is important to wean off of your opioid medication as soon as possible. If you do not need pain medication after your surgery it is ok to stop day one. Opioids include: Codeine, Hydrocodone(Norco, Vicodin), Oxycodone (Percocet, oxycontin ) and hydromorphone  amongst others.  Long term and even short term use of opiods can cause: Increased pain response Dependence Constipation Depression Respiratory depression And more.  Withdrawal symptoms can include Flu like symptoms Nausea, vomiting And more Techniques to manage these symptoms Hydrate well Eat regular healthy meals Stay active Use relaxation techniques(deep breathing, meditating, yoga) Do Not substitute Alcohol  to help with tapering If you have been on opioids for less than two weeks and do not have pain than it is ok to stop all together.  Plan to wean off of opioids This plan should start within one week post op of your joint replacement. Maintain the same interval or time between taking each dose and first decrease the dose.  Cut the total daily intake of opioids by one tablet each day Next start to increase the time between doses. The last dose that should be eliminated is the evening dose.   MAKE SURE YOU:  Understand these instructions.  Get help right away  if you are not doing well or get worse.    Thank you for letting us  be a part of your medical care team.  It is a privilege we  respect greatly.  We hope these instructions will help you stay on track for a fast and full recovery!

## 2024-03-20 NOTE — Evaluation (Addendum)
 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 Pt PMH includes but is not limited to: OP, hep B, R UE tremor, L femur fx s/p ORIF and prostate ca.  Clinical Impression    Randall Dawson is a 63 y.o. male POD 0 s/p R TKA. Patient reports mod I with mobility at baseline. Patient is now limited by functional impairments (see PT problem list below) and requires CGA and cues for transfers and gait with RW. Patient was able to ambulate 55 and 45 feet with RW and CGA and cues for safe walker management. Patient educated on safe sequencing for stair mobility with R handrail, fall risk prevention, use of RW, pain management and goal, use of CP and ice and car transfers pt verbalized understanding. Patient instructed in exercises to facilitate ROM and circulation reviewed and HO provided. Patient will benefit from continued skilled PT interventions to address impairments and progress towards PLOF. Patient has met mobility goals at adequate level for discharge home with family support and OPPT services; will continue to follow if pt continues acute stay to progress towards Mod I goals.      If plan is discharge home, recommend the following: A little help with walking and/or transfers;A little help with bathing/dressing/bathroom;Assistance with cooking/housework;Assist for transportation;Help with stairs or ramp for entrance   Can travel by private vehicle        Equipment Recommendations Rolling walker (2 wheels)  Recommendations for Other Services       Functional Status Assessment Patient has had a recent decline in their functional status and demonstrates the ability to make significant improvements in function in a reasonable and predictable amount of time.     Precautions /  Restrictions Precautions Precautions: Knee;Fall Restrictions Weight Bearing Restrictions Per Provider Order: Yes RLE Weight Bearing Per Provider Order: Weight bearing as tolerated      Mobility  Bed Mobility Overal bed mobility: Needs Assistance Bed Mobility: Supine to Sit     Supine to sit: Supervision     General bed mobility comments: increased time, cues and HOB slightly elevated    Transfers Overall transfer level: Needs assistance Equipment used: Rolling walker (2 wheels) Transfers: Sit to/from Stand Sit to Stand: Contact guard assist           General transfer comment: min cues for safety, proper UE and AD placement    Ambulation/Gait Ambulation/Gait assistance: Contact guard assist Gait Distance (Feet): 55 Feet Assistive device: Rolling walker (2 wheels) Gait Pattern/deviations: Step-to pattern, Antalgic, Decreased stance time - right, Trunk flexed Gait velocity: decreased     General Gait Details: slight trunk flexion with min cues and use of B UE support to offload R LE in stance phase  Stairs Stairs: Yes Stairs assistance: Contact guard assist Stair Management: Two rails, One rail Right Number of Stairs: 2 General stair comments: step navigation instruction initiated with B handrails and cues for safety, sequencing and technique pt able to self cue appropriatly and progress to R handrail only to assend steps and R handrail and HHA to descend steps with CGA  Wheelchair Mobility     Tilt Bed    Modified Rankin (Stroke Patients Only)       Balance Overall balance assessment: Needs assistance Sitting-balance support: Feet supported Sitting balance-Leahy Scale: Good  Standing balance support: Bilateral upper extremity supported, During functional activity, Reliant on assistive device for balance Standing balance-Leahy Scale: Fair Standing balance comment: static standing no UE support                             Pertinent  Vitals/Pain Pain Assessment Pain Assessment: 0-10 Pain Score: 8  Pain Location: R knee and LE Pain Descriptors / Indicators: Aching, Constant, Discomfort, Grimacing, Operative site guarding Pain Intervention(s): Limited activity within patient's tolerance, Monitored during session, Premedicated before session, Repositioned, Ice applied    Home Living Family/patient expects to be discharged to:: Private residence Living Arrangements: Alone Available Help at Discharge: Family Type of Home: House Home Access: Stairs to enter Entrance Stairs-Rails: None Secretary/administrator of Steps: 1   Home Layout: One level Home Equipment: Cane - single point Additional Comments: plans to d/c home with sister and brother in law one story home 3 steps to enter R handrail    Prior Function Prior Level of Function : Independent/Modified Independent;Driving             Mobility Comments: mod I with SPC for all ADLs, self care tasks and IADLs       Extremity/Trunk Assessment        Lower Extremity Assessment Lower Extremity Assessment: RLE deficits/detail RLE Deficits / Details: ankle DF/PF 5/5; SLR < 10 degree lag RLE Sensation: decreased light touch    Cervical / Trunk Assessment Cervical / Trunk Assessment: Normal  Communication   Communication Communication: No apparent difficulties    Cognition Arousal: Alert Behavior During Therapy: WFL for tasks assessed/performed   PT - Cognitive impairments: No apparent impairments                         Following commands: Intact       Cueing       General Comments      Exercises Total Joint Exercises Ankle Circles/Pumps: AROM, Both, 10 reps Quad Sets: AROM, Right, 5 reps Short Arc Quad: AROM, Right, 5 reps Heel Slides: AROM, Right, 5 reps Hip ABduction/ADduction: AROM, Right, 5 reps Straight Leg Raises: AROM, Right, 5 reps Knee Flexion: AROM, Right, 5 reps, Seated   Assessment/Plan    PT Assessment Patient  needs continued PT services  PT Problem List Decreased strength;Decreased range of motion;Decreased activity tolerance;Decreased balance;Decreased mobility;Pain       PT Treatment Interventions DME instruction;Gait training;Stair training;Functional mobility training;Therapeutic activities;Therapeutic exercise;Balance training;Neuromuscular re-education;Patient/family education;Modalities    PT Goals (Current goals can be found in the Care Plan section)  Acute Rehab PT Goals Patient Stated Goal: walking no pain and get up out of the recliner and loose some weight PT Goal Formulation: With patient Time For Goal Achievement: 04/02/24 Potential to Achieve Goals: Good    Frequency 7X/week     Co-evaluation               AM-PAC PT 6 Clicks Mobility  Outcome Measure Help needed turning from your back to your side while in a flat bed without using bedrails?: None Help needed moving from lying on your back to sitting on the side of a flat bed without using bedrails?: A Little Help needed moving to and from a bed to a chair (including a wheelchair)?: A Little Help needed standing up from a chair using your arms (e.g., wheelchair or bedside chair)?: A Little Help needed to walk in hospital room?:  A Little Help needed climbing 3-5 steps with a railing? : A Little 6 Click Score: 19    End of Session Equipment Utilized During Treatment: Gait belt Activity Tolerance: No increased pain;Patient tolerated treatment well Patient left: in chair;with call bell/phone within reach Nurse Communication: Mobility status;Other (comment) (pt readiness for same day d/c from PT standpoint) PT Visit Diagnosis: Unsteadiness on feet (R26.81);Other abnormalities of gait and mobility (R26.89);Muscle weakness (generalized) (M62.81);Difficulty in walking, not elsewhere classified (R26.2);Pain Pain - Right/Left: Right Pain - part of body: Knee;Leg    Time: 8851-8756 PT Time Calculation (min) (ACUTE  ONLY): 55 min   Charges:   PT Evaluation $PT Eval Low Complexity: 1 Low PT Treatments $Gait Training: 8-22 mins $Therapeutic Exercise: 8-22 mins $Therapeutic Activity: 8-22 mins PT General Charges $$ ACUTE PT VISIT: 1 Visit        [place], PT Acute Rehab   [place] VEAR Drone 03/20/2024, 1:13 PM

## 2024-03-21 ENCOUNTER — Encounter (HOSPITAL_COMMUNITY): Payer: Self-pay | Admitting: Orthopedic Surgery

## 2024-03-26 NOTE — Addendum Note (Signed)
 Addendum  created 03/26/24 1222 by Augusta Daved SAILOR, CRNA   Intraprocedure Event edited

## 2024-04-25 ENCOUNTER — Inpatient Hospital Stay: Attending: Hematology

## 2024-04-25 ENCOUNTER — Inpatient Hospital Stay

## 2024-04-25 VITALS — BP 110/73 | HR 108 | Temp 98.2°F | Resp 18

## 2024-04-25 DIAGNOSIS — R609 Edema, unspecified: Secondary | ICD-10-CM | POA: Insufficient documentation

## 2024-04-25 DIAGNOSIS — C61 Malignant neoplasm of prostate: Secondary | ICD-10-CM | POA: Diagnosis present

## 2024-04-25 DIAGNOSIS — Z191 Hormone sensitive malignancy status: Secondary | ICD-10-CM | POA: Insufficient documentation

## 2024-04-25 DIAGNOSIS — E876 Hypokalemia: Secondary | ICD-10-CM | POA: Insufficient documentation

## 2024-04-25 DIAGNOSIS — C775 Secondary and unspecified malignant neoplasm of intrapelvic lymph nodes: Secondary | ICD-10-CM | POA: Insufficient documentation

## 2024-04-25 DIAGNOSIS — K769 Liver disease, unspecified: Secondary | ICD-10-CM | POA: Diagnosis not present

## 2024-04-25 DIAGNOSIS — Z7901 Long term (current) use of anticoagulants: Secondary | ICD-10-CM | POA: Diagnosis not present

## 2024-04-25 DIAGNOSIS — M8580 Other specified disorders of bone density and structure, unspecified site: Secondary | ICD-10-CM | POA: Insufficient documentation

## 2024-04-25 DIAGNOSIS — Z79899 Other long term (current) drug therapy: Secondary | ICD-10-CM | POA: Insufficient documentation

## 2024-04-25 DIAGNOSIS — C7952 Secondary malignant neoplasm of bone marrow: Secondary | ICD-10-CM | POA: Diagnosis not present

## 2024-04-25 DIAGNOSIS — Z79818 Long term (current) use of other agents affecting estrogen receptors and estrogen levels: Secondary | ICD-10-CM | POA: Insufficient documentation

## 2024-04-25 DIAGNOSIS — Z7952 Long term (current) use of systemic steroids: Secondary | ICD-10-CM | POA: Insufficient documentation

## 2024-04-25 DIAGNOSIS — D649 Anemia, unspecified: Secondary | ICD-10-CM | POA: Diagnosis not present

## 2024-04-25 DIAGNOSIS — Z791 Long term (current) use of non-steroidal anti-inflammatories (NSAID): Secondary | ICD-10-CM | POA: Diagnosis not present

## 2024-04-25 LAB — CBC WITH DIFFERENTIAL/PLATELET
Abs Immature Granulocytes: 0.05 K/uL (ref 0.00–0.07)
Basophils Absolute: 0 K/uL (ref 0.0–0.1)
Basophils Relative: 0 %
Eosinophils Absolute: 0.1 K/uL (ref 0.0–0.5)
Eosinophils Relative: 1 %
HCT: 35.6 % — ABNORMAL LOW (ref 39.0–52.0)
Hemoglobin: 11.4 g/dL — ABNORMAL LOW (ref 13.0–17.0)
Immature Granulocytes: 0 %
Lymphocytes Relative: 12 %
Lymphs Abs: 1.6 K/uL (ref 0.7–4.0)
MCH: 31.8 pg (ref 26.0–34.0)
MCHC: 32 g/dL (ref 30.0–36.0)
MCV: 99.4 fL (ref 80.0–100.0)
Monocytes Absolute: 1.2 K/uL — ABNORMAL HIGH (ref 0.1–1.0)
Monocytes Relative: 9 %
Neutro Abs: 10.5 K/uL — ABNORMAL HIGH (ref 1.7–7.7)
Neutrophils Relative %: 78 %
Platelets: 279 K/uL (ref 150–400)
RBC: 3.58 MIL/uL — ABNORMAL LOW (ref 4.22–5.81)
RDW: 13.3 % (ref 11.5–15.5)
WBC: 13.5 K/uL — ABNORMAL HIGH (ref 4.0–10.5)
nRBC: 0 % (ref 0.0–0.2)

## 2024-04-25 LAB — COMPREHENSIVE METABOLIC PANEL WITH GFR
ALT: <5 U/L (ref 0–44)
AST: 13 U/L — ABNORMAL LOW (ref 15–41)
Albumin: 3.9 g/dL (ref 3.5–5.0)
Alkaline Phosphatase: 114 U/L (ref 38–126)
Anion gap: 11 (ref 5–15)
BUN: 13 mg/dL (ref 8–23)
CO2: 26 mmol/L (ref 22–32)
Calcium: 9.8 mg/dL (ref 8.9–10.3)
Chloride: 105 mmol/L (ref 98–111)
Creatinine, Ser: 0.89 mg/dL (ref 0.61–1.24)
GFR, Estimated: >60 mL/min (ref >60)
Glucose, Bld: 101 mg/dL — ABNORMAL HIGH (ref 70–99)
Potassium: 3.7 mmol/L (ref 3.5–5.1)
Sodium: 142 mmol/L (ref 135–145)
Total Bilirubin: 0.4 mg/dL (ref 0.0–1.2)
Total Protein: 7 g/dL (ref 6.5–8.1)

## 2024-04-25 LAB — PSA: Prostatic Specific Antigen: 0.1 ng/mL (ref 0.00–4.00)

## 2024-04-25 LAB — VITAMIN D 25 HYDROXY (VIT D DEFICIENCY, FRACTURES): Vit D, 25-Hydroxy: 32 ng/mL (ref 30–100)

## 2024-04-25 MED ORDER — LEUPROLIDE ACETATE (6 MONTH) 45 MG ~~LOC~~ KIT
45.0000 mg | PACK | Freq: Once | SUBCUTANEOUS | Status: AC
  Administered 2024-04-25: 45 mg via SUBCUTANEOUS

## 2024-04-25 NOTE — Progress Notes (Signed)
 Patient presents today for Eligard  and Prolia , patient reports having knee replacement 3 1/2 weeks ago, per pharmacy and MD have patient reschedule prolia  for 3 moths post surgery. Patient tolerated injection with no complaints voiced. Site clean and dry with no bruising or swelling noted at site. See MAR for details. Band aid applied.  Patient stable during and after injection. VSS with discharge and left in satisfactory condition with no s/s of distress noted.

## 2024-04-25 NOTE — Patient Instructions (Signed)
 [facility] [place] - A DEPT OF [place]  Discharge Instructions: Thank you for choosing [facility] to provide your oncology and hematology care.  If you have a lab appointment with the Cancer Center - please note that after April 8th, 2024, all labs will be drawn in the cancer center.  You do not have to check in or register with the main entrance as you have in the past but will complete your check-in in the cancer center.  Wear comfortable clothing and clothing appropriate for easy access to any Portacath or PICC line.   We strive to give you quality time with your provider. You may need to reschedule your appointment if you arrive late (15 or more minutes).  Arriving late affects you and other patients whose appointments are after yours.  Also, if you miss three or more appointments without notifying the office, you may be dismissed from the clinic at the provider's discretion.      For prescription refill requests, have your pharmacy contact our office and allow 72 hours for refills to be completed.    Today you received the following Eligard , return as scheduled.   To help prevent nausea and vomiting after your treatment, we encourage you to take your nausea medication as directed.  BELOW ARE SYMPTOMS THAT SHOULD BE REPORTED IMMEDIATELY: *FEVER GREATER THAN 100.4 F (38 C) OR HIGHER *CHILLS OR SWEATING *NAUSEA AND VOMITING THAT IS NOT CONTROLLED WITH YOUR NAUSEA MEDICATION *UNUSUAL SHORTNESS OF BREATH *UNUSUAL BRUISING OR BLEEDING *URINARY PROBLEMS (pain or burning when urinating, or frequent urination) *BOWEL PROBLEMS (unusual diarrhea, constipation, pain near the anus) TENDERNESS IN MOUTH AND THROAT WITH OR WITHOUT PRESENCE OF ULCERS (sore throat, sores in mouth, or a toothache) UNUSUAL RASH, SWELLING OR PAIN  UNUSUAL VAGINAL DISCHARGE OR ITCHING   Items with * indicate a potential emergency and should be followed up as soon as possible or  go to the Emergency Department if any problems should occur.  Please show the CHEMOTHERAPY ALERT CARD or IMMUNOTHERAPY ALERT CARD at check-in to the Emergency Department and triage nurse.  Should you have questions after your visit or need to cancel or reschedule your appointment, please contact [facility] [place] - A DEPT OF [facility] [phone]  and follow the prompts.  Office hours are 8:00 a.m. to 4:30 p.m. Monday - Friday. Please note that voicemails left after 4:00 p.m. may not be returned until the following business day.  We are closed weekends and major holidays. You have access to a nurse at all times for urgent questions. Please call the main number to the clinic [phone] and follow the prompts.  For any non-urgent questions, you may also contact your provider using MyChart. We now offer e-Visits for anyone 48 and older to request care online for non-urgent symptoms. For details visit [email or web address].   Also download the MyChart app! Go to the app store, search MyChart, open the app, select [place], and log in with your MyChart username and password.

## 2024-05-01 ENCOUNTER — Other Ambulatory Visit: Payer: Self-pay | Admitting: *Deleted

## 2024-05-01 DIAGNOSIS — C61 Malignant neoplasm of prostate: Secondary | ICD-10-CM

## 2024-05-01 MED ORDER — ABIRATERONE ACETATE 250 MG PO TABS: 1000.0000 mg | ORAL_TABLET | Freq: Every day | ORAL | 11 refills | Status: AC

## 2024-05-01 NOTE — Progress Notes (Signed)
 [facility] PHYSICAL THERAPY [place] [facility]APY 05/01/2024 Note Type: Treatment Note    Patient Name: Randall Dawson Date of Birth:1961/07/24 Diagnosis:  Encounter Diagnosis  Name Primary?  . Status post total right knee replacement Yes   Referring MD:  Edna Toribio Banning*   Date of Onset of Impairment-03/20/2024 Date PT Care Plan Established or Reviewed-04/17/2024 Date PT Treatment Started-04/16/2024  Visit Count: 6 Plan of Care Effective Date: 04/16/2024 - 06/14/2024    6 of 16 visits, Reassessment due 05/16/2024   Assessment/Plan:   Assessment Assessment details:    Pt tolerated treatment well. Continued with steady progression of RLE strength, endurance, and range of motion exercises to tolerance. Added standing TKE with resistance for quadriceps facilitation and end range extension strengthening. Continued with dynamic activities for balance, gait without assistive device. Requires frequent cueing on proper technique and pacing of exercises for max benefit of each. Reported muscle fatigue following treatment, but noted decreased pain symptoms. AAROM flexion 110 degrees   Patient will benefit from skilled PT intervention to address current body structure impairments and activity limitations to return to functions of community and household mobility.      Impairments: impaired ADLs, decreased strength, decreased mobility, fall risk, pain, impaired balance, increased edema, decreased range of motion, gait deviation, impaired bed mobility and impaired transfers     Prognosis: good prognosis     Examination of Body Systems: activity/participation, musculoskeletal and integumentary   Clinical Decision Making: moderate   Positive Prognosis Rationale: motivated for treatment, safety awareness and caregiver/family support.   Clinical Presentation: stable  Therapy Goals     Goals:     Patient Goals: Patient would like to improve R knee ROM and strength to return to  independence and safety with transfers, self care, household ADLs, and community gait.   Short-term Goals to be achieved in 4 weeks. Patient will: 1. Demonstrate independence with home exercise program for continued self treatment. 2. Improve R knee AROM to 0 to 110 for improved transfers and ADLs. 3. Improve RLE strength to 4-/5 for improved safety w community mobility. 4. Improve LEFS score by 10 pts for improved independence w gt and ADLs.  Long-term Goals to be achieved in 8 weeks. Patient will: 1. Demonstrate RLE strength 4+/5 or greater for safety with community ambulation. 2. Ambulate greater than 1000 ft without assistive device for normal community ambulation. 3. Negotiate 1 full flight of stairs at independent level with reciprocal pattern. 4. Improve LEFS score by 20 pts for improved independence and safety w ADLs and community gt.  Plan   Therapy options: will be seen for skilled physical therapy services   Planned therapy interventions: 97140-Manual Therapy, 97530-Therapeutic Activities, 97750-Physical Performance Test, 97116-Gait Training, 97112-Neuromuscular Re-education, 97110-Therapeutic Exercises, 97032, G0283-Electrical Stimulation (unattended, attended), 97016-Pneumatic Devices (compression pump) and 97010-Cold Packs/Hot Packs    Frequency: 2x week   Duration in weeks: 8    Education provided: HEP   Education results: verbalized good understanding and demonstrates understanding.   Communication/Consultation: Initial note sent to Referring Provider.    Total Treatment Time: 62   Total Timed Code Minutes: 55   Treatment rendered today:     S/p R TKA 03/20/2024  Therapeutic Exercise: 40 min -Nustep level 4 x 10 min. -Step stretch for knee flexion on second step 10 x 10 sec -Step stretch for knee extension on second step 10 x 10 sec -Slant board stretch 4 x 20 sec -Standing heel raises x 20 -Standing toe raises x 20 -  Standing hamstring curl for LLE x 20  reps -Standing hip abduction, hip flexion with 20# resistance x 20 reps B -Standing TKE with 100# resistance, 2 x 10 reps with 3 sec holds -Seated heel slides for knee flexion with foot on floor x 30 -Seated towel stretch for knee extension, calf stretch 4 x 20 sec -Seated B hip adduction with ball squeeze with LAQ x 15 -SLR w quad set, req min A and cueing for full extension 10 reps -Supine heel slides w strap 10 reps w 10 sec holds -Supine SAQ with bolster under knees and 3 sec holds w ankle pumps x 20 reps   TA: 10 min. -Dynamic activities: high knees, tin soldier, butt kicks each 2 x 30 feet with no assistive device -Sit to stand from high low table, 2 x 10 reps  Manual 5 min. Patient supine, PROM into extension and flexion with 5 sec hold available end range x 10 reps each Patient supine, patellar mobilizations all directions with small oscillations x .  Pt was instructed in above exercises and provided w verbal and visual cueing required for follow through. Pt was also instructed in home exercise program and was provided w printed instructions for home compliance.  Modalities: 15 min. Vasopneumatic Device: Game Ready vasopneumatic device was applied to R knee on low pressure  5-15 mm Hg after TE to decrease pain & inflammation x 15 min with pt in reclined supported long sitting.  Not performed: TIME -Seated B hip adduction with ball squeeze 15 x 5 sec   Plan details: Therapeutic exercise, therapeutic activities, Neuromuscular Re-education, gt training, work conditioning/Job simulation, and pt education to improve independence and safety with gt, ADLs, and self care. Modalities (heat, ice, ultrasound, electrical stimulation, dry needling) as needed for pain management. Massage, mobilization and manual therapy as needed to increase ROM in restricted soft tissues and joints.    Subjective:   History of Present Condition    Date of surgery:  03/20/2024   History of Present  Condition/Chief Complaint:  63 yo male presents to therapy s/p R TKA on 03/20/2024 due to failure of conservative measures. Pt PMH includes but is not limited to: OP, hep B, R UE tremor, L femur fx s/p ORIF and prostate ca. Subjective:  Pt notes he has follow up with referring MD this Thursday (10/9). Notes minimal soreness following previous treatment and continues to report swelling in R knee. States he's icing periodically at home as needed.  Pain:    Current pain rating:  5   At best pain rating:  5   At worst pain rating:  6  Location:  R knee circumferentially   Quality:  Aching and constant   Relieving factors:  Sitting, ice and medications   Aggravating factors:  Bending, walking and standing   Pain related Behaviors:  None   Progression:  No change   Red Flags:  None Precautions/Equipment  Precautions:  Weight bearing as tolerated   Current Braces/Orthoses:  None   Equipment Currently Used:  Single point cane (has walker for use as needed) Prior Functional Status    No physical limitations   Current Functional Status:   difficulty with transfers, limited bending, limited household activities, leisure activities, disturbed sleep, limited walking tolerance and use of assistive device Social Support:    Lives Environment:  One-story house   Lives with:  Alone (staying with sister and brother in law since sx) Barriers to Learning:  No Barriers   Work/School:  Retired, disabled Treatments:    Previous treatment:  Physical therapy and surgery Patient Goals:    Patient/Family goals for therapy:  Decreased pain, improved ambulation, improved sleep, improved standing tolerance, increased ROM, increased strength, independence with ADLs/IADLs and return to recreational activites   Objective:    Ambulation  Observational Gait  Gait: asymmetric  Decreased walking speed and right stance time.  Additional Observational Gait Details-Ambulates w cane w slight persistent R knee flexion    Range of Motion Knee Active ROM Left Knee-Active ROM Normal active range of motion Right Knee-Active ROM Flexion: 100 degrees   Strength Right Hip  Planes of Motion  Flexion: 4+ Abduction: 4 Adduction: 4 Left Knee Normal strength Right Knee  Flexion: 3 Extension: 3- Right Ankle/Foot  Dorsiflexion: 4+  Tests    Functional Assessment  Lower Extremity Functional Scale LEFS Score (out of 80): 19 Assessment: 76% functional impairment                    I attest that I have reviewed the above information. Signed: Edsel Marina, PTA 05/01/2024 8:06 AM

## 2024-05-02 ENCOUNTER — Inpatient Hospital Stay (HOSPITAL_BASED_OUTPATIENT_CLINIC_OR_DEPARTMENT_OTHER): Admitting: Oncology

## 2024-05-02 VITALS — BP 117/85 | HR 107 | Temp 98.3°F | Resp 18 | Wt 247.1 lb

## 2024-05-02 DIAGNOSIS — C772 Secondary and unspecified malignant neoplasm of intra-abdominal lymph nodes: Secondary | ICD-10-CM

## 2024-05-02 DIAGNOSIS — D508 Other iron deficiency anemias: Secondary | ICD-10-CM | POA: Diagnosis not present

## 2024-05-02 DIAGNOSIS — C61 Malignant neoplasm of prostate: Secondary | ICD-10-CM | POA: Diagnosis not present

## 2024-05-02 NOTE — Progress Notes (Signed)
 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 male was transferred to my care after his prior physician has left.   ASSESSMENT & PLAN:   Assessment & Plan: Saed Hudlow  is a 63 y.o. male with ***  Assessment & Plan     The patient understands the plans discussed today and is in agreement with them.  He knows to contact our office if he develops concerns prior to his next appointment.  *** minutes of total time was spent for this patient encounter, including preparation,review of records,  face-to-face counseling with the patient and coordination of care, physical exam, and documentation of the encounter.    LILLETTE Verneta SAUNDERS Teague,acting as a Neurosurgeon for Mickiel Dry, MD.,have documented all relevant documentation on the behalf of Mickiel Dry, MD,as directed by  Mickiel Dry, MD while in the presence of Mickiel Dry, MD.  ***  Verneta SAUNDERS Ege  [facility] [place] - A DEPT OF [facility] [address]  [place] [address] Dept: [phone] Dept Fax: [phone]   No orders of the defined types were placed in this encounter.    ONCOLOGY HISTORY:   I have reviewed his chart and materials related to his cancer extensively and collaborated history with the patient. 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  Pathology: Prostatic adenocarcinoma in all 12 cores, Gleason pattern 4 + 4 = 8, prognostic grade group 4.  -12/03/2019: PSA 119.87 -12/17/2019: Serum testosterone:  197 -12/17/2019: NM Bone scan: Small focus of uptake seen left inferior orbit, favor benign etiology. No definitive scintigraphic evidence of osseous metastatic disease.  -12/17/2019: CT AP: Retroperitoneal and bilateral iliac chain metastatic adenopathy. Ill-defined hypoenhancing 12 mm hepatic lesion, indeterminate however concerning for metastases. Sclerotic focus in the right posterior ilium, indeterminate for metastasis.  -01/10/2020: MRI liver: Tiny sub-cm lesion in the posterior right hepatic lobe is too small to characterize. No other significant abnormality identified.  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 Patient is accompanied by *** .     I have reviewed the past medical history, past surgical history, social history and family history with the patient and they are unchanged from previous note.  ALLERGIES:  has no known allergies.  MEDICATIONS:  Current Outpatient Medications  Medication Sig Dispense Refill   abiraterone  acetate (ZYTIGA ) 250 MG tablet Take 4 tablets (1,000 mg total) by mouth daily. Take on an empty stomach 1 hour before or 2 hours after a meal 120 tablet 11   apixaban  (ELIQUIS ) 2.5 MG TABS tablet Take 1 tablet (2.5 mg total) by mouth 2 (two) times daily. 60 tablet 0   bisacodyl (DULCOLAX) 5 MG EC tablet Take 5 mg by mouth daily as needed for moderate constipation.  Calcium Carbonate (CALCIUM 600 PO) Take 600 mg by mouth daily.     furosemide  (LASIX ) 20 MG tablet TAKE 1 TABLET(20 MG) BY MOUTH DAILY AS NEEDED 30 tablet 2   HYDROcodone-acetaminophen  (NORCO) 10-325 MG tablet Take 1  tablet by mouth every 4 (four) hours as needed for moderate pain (pain score 4-6) or severe pain (pain score 7-10).     meloxicam (MOBIC) 15 MG tablet Take 15 mg by mouth daily.     oxyCODONE  (OXY IR/ROXICODONE ) 5 MG immediate release tablet Take 5 mg by mouth every 8 (eight) hours as needed.     potassium chloride  (KLOR-CON ) 10 MEQ tablet TAKE 1 TABLET(10 MEQ) BY MOUTH DAILY 30 tablet 6   predniSONE  (DELTASONE ) 5 MG tablet Take 1 tablet (5 mg total) by mouth 2 (two) times daily with a meal. 60 tablet 11   No current facility-administered medications for this visit.    REVIEW OF SYSTEMS:   Constitutional: Denies fevers, chills or abnormal weight loss Eyes: Denies blurriness of vision Ears, nose, mouth, throat, and face: Denies mucositis or sore throat Respiratory: Denies cough, dyspnea or wheezes Cardiovascular: Denies palpitation, chest discomfort or lower extremity swelling Gastrointestinal:  Denies nausea, heartburn or change in bowel habits Skin: Denies abnormal skin rashes Lymphatics: Denies new lymphadenopathy or easy bruising Neurological:Denies numbness, tingling or new weaknesses Behavioral/Psych: Mood is stable, no new changes  All other systems were reviewed with the patient and are negative.   VITALS:  There were no vitals taken for this visit.  Wt Readings from Last 3 Encounters:  03/14/24 242 lb (109.8 kg)  01/31/24 250 lb (113.4 kg)  11/01/23 250 lb 14.1 oz (113.8 kg)    There is no height or weight on file to calculate BMI.  Performance status (ECOG): {CHL ONC [id number]  PHYSICAL EXAM:   GENERAL:alert, no distress and comfortable SKIN: skin color, texture, turgor are normal, no rashes or significant lesions EYES: normal, Conjunctiva are pink and non-injected, sclera clear OROPHARYNX:no exudate, no erythema and lips, buccal mucosa, and tongue normal  NECK: supple, thyroid normal size, non-tender, without nodularity LYMPH:  no palpable lymphadenopathy in  the cervical, axillary or inguinal LUNGS: clear to auscultation and percussion with normal breathing effort HEART: regular rate & rhythm and no murmurs and no lower extremity edema ABDOMEN:abdomen soft, non-tender and normal bowel sounds Musculoskeletal:no cyanosis of digits and no clubbing  NEURO: alert & oriented x 3 with fluent speech, no focal motor/sensory deficits  LABORATORY DATA:  I have reviewed the data as listed  No results found for: SPEP, UPEP  Lab Results  Component Value Date   WBC 13.5 (H) 04/25/2024   NEUTROABS 10.5 (H) 04/25/2024   HGB 11.4 (L) 04/25/2024   HCT 35.6 (L) 04/25/2024   MCV 99.4 04/25/2024   PLT 279 04/25/2024    @LASTCHEMISTRY @   RADIOGRAPHIC STUDIES: I have personally reviewed the radiological images as listed and agreed with the findings in the report. DG Knee Right Port CLINICAL DATA:  Post right knee replacement.  EXAM: PORTABLE RIGHT KNEE - 1-2 VIEW  COMPARISON:  None Available.  FINDINGS: Right knee arthroplasty in expected alignment. No periprosthetic lucency or fracture. Recent postsurgical change includes air and edema in the soft tissues and joint space.  IMPRESSION: Right knee arthroplasty without immediate postoperative complication.  Electronically Signed   By: Andrea Gasman M.D.   On: 03/20/2024 12:46

## 2024-05-02 NOTE — Patient Instructions (Signed)
 [facility] at [facility] Discharge Instructions   You were seen and examined today by Dr. Davonna.  She reviewed the results of your lab work which are normal/stable.   Continue Zytiga  as prescribed.   We will see you back in 3 months. We will repeat lab work prior to this visit.    Return as scheduled.    Thank you for choosing [facility] at [facility] to provide your oncology and hematology care.  To afford each patient quality time with our provider, please arrive at least 15 minutes before your scheduled appointment time.   If you have a lab appointment with the Cancer Center please come in thru the Main Entrance and check in at the main information desk.  You need to re-schedule your appointment should you arrive 10 or more minutes late.  We strive to give you quality time with our providers, and arriving late affects you and other patients whose appointments are after yours.  Also, if you no show three or more times for appointments you may be dismissed from the clinic at the providers discretion.     Again, thank you for choosing [facility].  Our hope is that these requests will decrease the amount of time that you wait before being seen by our physicians.       _____________________________________________________________  Should you have questions after your visit to [facility], please contact our office at [phone] and follow the prompts.  Our office hours are 8:00 a.m. and 4:30 p.m. Monday - Friday.  Please note that voicemails left after 4:00 p.m. may not be returned until the following business day.  We are closed weekends and major holidays.  You do have access to a nurse 24-7, just call the main number to the clinic [phone] and do not press any options, hold on the line and a nurse will answer the phone.    For prescription refill requests, have your pharmacy contact our office and allow 72  hours.    Due to Covid, you will need to wear a mask upon entering the hospital. If you do not have a mask, a mask will be given to you at the Main Entrance upon arrival. For doctor visits, patients may have 1 support person age 14 or older with them. For treatment visits, patients can not have anyone with them due to social distancing guidelines and our immunocompromised population.

## 2024-05-02 NOTE — Progress Notes (Unsigned)
 Patient is taking Zytiga as prescribed. He has not missed any doses and reports no side effects at this time.

## 2024-05-03 ENCOUNTER — Encounter (HOSPITAL_COMMUNITY): Payer: Self-pay | Admitting: Oncology

## 2024-05-15 ENCOUNTER — Other Ambulatory Visit: Payer: Self-pay | Admitting: *Deleted

## 2024-05-15 MED ORDER — FUROSEMIDE 20 MG PO TABS: 20.0000 mg | ORAL_TABLET | Freq: Every day | ORAL | 2 refills | Status: AC

## 2024-05-15 NOTE — Telephone Encounter (Signed)
 Per chart review, okay to refill

## 2024-06-07 ENCOUNTER — Other Ambulatory Visit: Payer: Self-pay

## 2024-06-07 DIAGNOSIS — D508 Other iron deficiency anemias: Secondary | ICD-10-CM

## 2024-06-07 DIAGNOSIS — C61 Malignant neoplasm of prostate: Secondary | ICD-10-CM

## 2024-06-10 ENCOUNTER — Inpatient Hospital Stay: Attending: Hematology

## 2024-06-10 ENCOUNTER — Inpatient Hospital Stay

## 2024-06-10 VITALS — BP 132/95 | HR 109 | Temp 96.9°F | Resp 20

## 2024-06-10 DIAGNOSIS — C61 Malignant neoplasm of prostate: Secondary | ICD-10-CM

## 2024-06-10 DIAGNOSIS — D508 Other iron deficiency anemias: Secondary | ICD-10-CM

## 2024-06-10 LAB — COMPREHENSIVE METABOLIC PANEL WITH GFR
ALT: <5 U/L (ref 0–44)
AST: 13 U/L — ABNORMAL LOW (ref 15–41)
Albumin: 4 g/dL (ref 3.5–5.0)
Alkaline Phosphatase: 141 U/L — ABNORMAL HIGH (ref 38–126)
Anion gap: 10 (ref 5–15)
BUN: 16 mg/dL (ref 8–23)
CO2: 28 mmol/L (ref 22–32)
Calcium: 9.7 mg/dL (ref 8.9–10.3)
Chloride: 103 mmol/L (ref 98–111)
Creatinine, Ser: 0.89 mg/dL (ref 0.61–1.24)
GFR, Estimated: >60 mL/min (ref >60)
Glucose, Bld: 95 mg/dL (ref 70–99)
Potassium: 3.7 mmol/L (ref 3.5–5.1)
Sodium: 141 mmol/L (ref 135–145)
Total Bilirubin: 0.5 mg/dL (ref 0.0–1.2)
Total Protein: 7.3 g/dL (ref 6.5–8.1)

## 2024-06-10 MED ORDER — DENOSUMAB 60 MG/ML ~~LOC~~ SOSY
60.0000 mg | PREFILLED_SYRINGE | Freq: Once | SUBCUTANEOUS | Status: AC
  Administered 2024-06-10: 60 mg via SUBCUTANEOUS
  Filled 2024-06-10: qty 1

## 2024-06-10 NOTE — Progress Notes (Signed)
 Labs reviewed. Per pt he is taking calcium supplements as prescribed. Patient tolerated Prolia  injection with no complaints voiced.  Site clean and dry with no bruising or swelling noted at site.  See MAR for details.  Band aid applied.  Patient stable during and after injection.  Vss with discharge and left in satisfactory condition with no s/s of distress noted. All follow ups as scheduled.   Pamela Maddy

## 2024-06-23 ENCOUNTER — Other Ambulatory Visit: Payer: Self-pay

## 2024-06-23 ENCOUNTER — Emergency Department (HOSPITAL_COMMUNITY)
Admission: EM | Admit: 2024-06-23 | Discharge: 2024-06-23 | Disposition: A | Discharge status: Home / Self Care | Attending: Emergency Medicine | Admitting: Emergency Medicine

## 2024-06-23 ENCOUNTER — Emergency Department (HOSPITAL_COMMUNITY)

## 2024-06-23 DIAGNOSIS — Z96651 Presence of right artificial knee joint: Secondary | ICD-10-CM | POA: Diagnosis not present

## 2024-06-23 DIAGNOSIS — S29011A Strain of muscle and tendon of front wall of thorax, initial encounter: Secondary | ICD-10-CM | POA: Insufficient documentation

## 2024-06-23 DIAGNOSIS — Z8546 Personal history of malignant neoplasm of prostate: Secondary | ICD-10-CM | POA: Insufficient documentation

## 2024-06-23 DIAGNOSIS — D72829 Elevated white blood cell count, unspecified: Secondary | ICD-10-CM | POA: Diagnosis not present

## 2024-06-23 DIAGNOSIS — Z7901 Long term (current) use of anticoagulants: Secondary | ICD-10-CM | POA: Diagnosis not present

## 2024-06-23 DIAGNOSIS — X58XXXA Exposure to other specified factors, initial encounter: Secondary | ICD-10-CM | POA: Diagnosis not present

## 2024-06-23 DIAGNOSIS — R0789 Other chest pain: Secondary | ICD-10-CM | POA: Diagnosis present

## 2024-06-23 LAB — BASIC METABOLIC PANEL WITH GFR
Anion gap: 12 (ref 5–15)
BUN: 22 mg/dL (ref 8–23)
CO2: 21 mmol/L — ABNORMAL LOW (ref 22–32)
Calcium: 9.4 mg/dL (ref 8.9–10.3)
Chloride: 108 mmol/L (ref 98–111)
Creatinine, Ser: 0.89 mg/dL (ref 0.61–1.24)
GFR, Estimated: >60 mL/min (ref >60)
Glucose, Bld: 107 mg/dL — ABNORMAL HIGH (ref 70–99)
Potassium: 3.9 mmol/L (ref 3.5–5.1)
Sodium: 141 mmol/L (ref 135–145)

## 2024-06-23 LAB — CBC
HCT: 37.5 % — ABNORMAL LOW (ref 39.0–52.0)
Hemoglobin: 12.2 g/dL — ABNORMAL LOW (ref 13.0–17.0)
MCH: 32 pg (ref 26.0–34.0)
MCHC: 32.5 g/dL (ref 30.0–36.0)
MCV: 98.4 fL (ref 80.0–100.0)
Platelets: 324 K/uL (ref 150–400)
RBC: 3.81 MIL/uL — ABNORMAL LOW (ref 4.22–5.81)
RDW: 13.9 % (ref 11.5–15.5)
WBC: 14.3 K/uL — ABNORMAL HIGH (ref 4.0–10.5)
nRBC: 0 % (ref 0.0–0.2)

## 2024-06-23 LAB — TROPONIN T, HIGH SENSITIVITY
Troponin T High Sensitivity: <15 ng/L (ref 0–19)
Troponin T High Sensitivity: <15 ng/L (ref 0–19)

## 2024-06-23 LAB — APTT: aPTT: 28 s (ref 24–36)

## 2024-06-23 MED ORDER — NAPROXEN 250 MG PO TABS
500.0000 mg | ORAL_TABLET | Freq: Once | ORAL | Status: AC
  Administered 2024-06-23: 500 mg via ORAL
  Filled 2024-06-23: qty 2

## 2024-06-23 MED ORDER — IOHEXOL 350 MG/ML SOLN
100.0000 mL | Freq: Once | INTRAVENOUS | Status: AC | PRN
  Administered 2024-06-23: 100 mL via INTRAVENOUS

## 2024-06-23 MED ORDER — ASPIRIN 81 MG PO CHEW
324.0000 mg | CHEWABLE_TABLET | Freq: Once | ORAL | Status: AC
  Administered 2024-06-23: 324 mg via ORAL
  Filled 2024-06-23: qty 4

## 2024-06-23 MED ORDER — MORPHINE SULFATE (PF) 4 MG/ML IV SOLN
4.0000 mg | Freq: Once | INTRAVENOUS | Status: AC
  Administered 2024-06-23: 4 mg via INTRAVENOUS
  Filled 2024-06-23: qty 1

## 2024-06-23 MED ORDER — METHOCARBAMOL 500 MG PO TABS: 500.0000 mg | ORAL_TABLET | Freq: Two times a day (BID) | ORAL | 0 refills | Status: AC

## 2024-06-23 MED ORDER — ONDANSETRON HCL 4 MG/2ML IJ SOLN
4.0000 mg | Freq: Once | INTRAMUSCULAR | Status: AC
  Administered 2024-06-23: 4 mg via INTRAVENOUS
  Filled 2024-06-23: qty 2

## 2024-06-23 MED ORDER — SODIUM CHLORIDE 0.9% FLUSH
3.0000 mL | Freq: Once | INTRAVENOUS | Status: AC
  Administered 2024-06-23: 3 mL via INTRAVENOUS

## 2024-06-23 MED ORDER — METHOCARBAMOL 500 MG PO TABS
500.0000 mg | ORAL_TABLET | Freq: Once | ORAL | Status: AC
  Administered 2024-06-23: 500 mg via ORAL
  Filled 2024-06-23: qty 1

## 2024-06-23 MED ORDER — LEVALBUTEROL HCL 1.25 MG/0.5ML IN NEBU
1.2500 mg | INHALATION_SOLUTION | Freq: Once | RESPIRATORY_TRACT | Status: AC
  Administered 2024-06-23: 1.25 mg via RESPIRATORY_TRACT
  Filled 2024-06-23: qty 0.5

## 2024-06-23 MED ORDER — NAPROXEN 500 MG PO TABS: 500.0000 mg | ORAL_TABLET | Freq: Two times a day (BID) | ORAL | 0 refills | Status: AC

## 2024-06-23 NOTE — Discharge Instructions (Addendum)
 Today's tests including your CT scan and your lab tests and EKG are all stable with no obvious source of this pain.  We believe that this is musculoskeletal since it is worse with certain movements and positions.  You have been prescribed some medication including an anti-inflammatory pain reliever along with a muscle relaxer.  You may also benefit by trying a heating pad to your chest and then your back area for not more than 20 minutes at each site given your skin rest from the heat.  There were 2 findings on your CT scan which have no correlation with today's symptoms but you should have further evaluated.  The first is it appears you have an enlarged thyroid gland and it is recommended that you have an ultrasound of your thyroid to make sure everything here is normal.  Additionally there is a small pulmonary nodule in your right lower lung which may need further imaging.  I recommend discussing these findings with Dr. Davonna at your next visit who can guide you further regarding any further imaging you may need.

## 2024-06-23 NOTE — ED Notes (Signed)
 Attempted to call pt to alert him that his medicines were sent to [facility] in [place]. I received no answer.

## 2024-06-23 NOTE — ED Triage Notes (Signed)
 Patient arrives POV c/c shortness of breath, chest pain, and back pain x4 days. States the pain/SOB is worst when bending over. Taking muscle relaxers that do not resolve the pain at all. Radiating from chest into back.

## 2024-06-23 NOTE — ED Provider Notes (Signed)
 [place] EMERGENCY DEPARTMENT AT [facility] Provider Note   CSN: [id number] Arrival date & time: 06/23/24  1301     Patient presents with: Chest Pain   Randall Dawson is a 63 y.o. male with a history including metastatic prostate cancer and hepatitis B presenting with a 4-day history of midsternal chest pain which radiates into his back between his shoulder blades in association with shortness of breath.  He reports symptoms at rest but is worsened with exertion and his pain particularly worsens when he bends forward while standing.  He denies any injuries or falls.  His pain is not reproducible with palpation.  He underwent a right knee arthroplasty the end of August, reports persistent right lower extremity swelling since that surgery which has not worsened with onset of today's symptoms.  He denies pain in this leg.  Denies fevers or chills, nausea or vomiting, reflux, diaphoresis.  He has had palpitations.   The history is provided by the patient.       Prior to Admission medications   Medication Sig Start Date End Date Taking? Authorizing Provider  methocarbamol  (ROBAXIN ) 500 MG tablet Take 1 tablet (500 mg total) by mouth 2 (two) times daily. 06/23/24  Yes Yianni Skilling, PA-C  naproxen (NAPROSYN) 500 MG tablet Take 1 tablet (500 mg total) by mouth 2 (two) times daily. 06/23/24  Yes Jamani Eley, PA-C  abiraterone  acetate (ZYTIGA ) 250 MG tablet Take 4 tablets (1,000 mg total) by mouth daily. Take on an empty stomach 1 hour before or 2 hours after a meal 05/01/24   Davonna Siad, MD  apixaban  (ELIQUIS ) 2.5 MG TABS tablet Take 1 tablet (2.5 mg total) by mouth 2 (two) times daily. 03/20/24   Edna Toribio LABOR, MD  bisacodyl (DULCOLAX) 5 MG EC tablet Take 5 mg by mouth daily as needed for moderate constipation.    [provider]  Calcium Carbonate (CALCIUM 600 PO) Take 600 mg by mouth daily.    [provider]  celecoxib  (CELEBREX ) 100 MG capsule  Take 100 mg by mouth 2 (two) times daily.    [provider]  furosemide  (LASIX ) 20 MG tablet Take 1 tablet (20 mg total) by mouth daily. 05/15/24   Kandala, Hyndavi, MD  HYDROcodone-acetaminophen  (NORCO) 10-325 MG tablet Take 1 tablet by mouth every 4 (four) hours as needed for moderate pain (pain score 4-6) or severe pain (pain score 7-10). 06/17/22   [provider]  meloxicam (MOBIC) 15 MG tablet Take 15 mg by mouth daily. 04/11/24   [provider]  oxyCODONE  (OXY IR/ROXICODONE ) 5 MG immediate release tablet Take 5 mg by mouth every 8 (eight) hours as needed. 04/15/24   [provider]  potassium chloride  (KLOR-CON ) 10 MEQ tablet TAKE 1 TABLET(10 MEQ) BY MOUTH DAILY 01/30/24   Rogers Hai, MD  predniSONE  (DELTASONE ) 5 MG tablet Take 1 tablet (5 mg total) by mouth 2 (two) times daily with a meal. 03/14/24   Davonna Siad, MD    Allergies: Patient has no known allergies.    Review of Systems  Constitutional:  Negative for chills and fever.  HENT:  Negative for congestion.   Eyes: Negative.   Respiratory:  Positive for shortness of breath. Negative for cough and chest tightness.   Cardiovascular:  Positive for chest pain, palpitations and leg swelling.  Gastrointestinal:  Negative for abdominal pain, nausea and vomiting.  Genitourinary: Negative.   Musculoskeletal:  Negative for arthralgias, joint swelling and neck pain.  Skin:  Negative.  Negative for rash and wound.  Neurological:  Negative for dizziness, weakness, light-headedness, numbness and headaches.  Psychiatric/Behavioral: Negative.      Updated Vital Signs BP 122/85   Pulse (!) 103   Temp 98.4 F (36.9 C) (Oral)   Resp 19   Ht 5' 9 (1.753 m)   Wt 114.3 kg   SpO2 98%   BMI 37.21 kg/m   Physical Exam Vitals and nursing note reviewed.  Constitutional:      Appearance: He is well-developed.  HENT:     Head: Normocephalic and atraumatic.  Eyes:     Conjunctiva/sclera:  Conjunctivae normal.  Cardiovascular:     Rate and Rhythm: Regular rhythm. Tachycardia present.     Heart sounds: Normal heart sounds.  Pulmonary:     Effort: Pulmonary effort is normal.     Breath sounds: Normal breath sounds. No wheezing.  Chest:     Chest wall: No deformity, tenderness, crepitus or edema.  Abdominal:     General: Bowel sounds are normal.     Palpations: Abdomen is soft.     Tenderness: There is no abdominal tenderness.  Musculoskeletal:        General: Normal range of motion.     Cervical back: Normal range of motion.     Right lower leg: Edema present.     Comments: Varicosities appreciated, no calf tenderness, negative Homans' sign  Skin:    General: Skin is warm and dry.  Neurological:     Mental Status: He is alert.     (all labs ordered are listed, but only abnormal results are displayed) Labs Reviewed  BASIC METABOLIC PANEL WITH GFR - Abnormal; Notable for the following components:      Result Value   CO2 21 (*)    Glucose, Bld 107 (*)    All other components within normal limits  CBC - Abnormal; Notable for the following components:   WBC 14.3 (*)    RBC 3.81 (*)    Hemoglobin 12.2 (*)    HCT 37.5 (*)    All other components within normal limits  APTT  TROPONIN T, HIGH SENSITIVITY  TROPONIN T, HIGH SENSITIVITY    EKG: EKG Interpretation Date/Time:  Sunday June 23 2024 17:45:37 EST Ventricular Rate:  104 PR Interval:  149 QRS Duration:  98 QT Interval:  348 QTC Calculation: 458 R Axis:   1  Text Interpretation: Sinus rhythm Borderline repolarization abnormality 12 Lead; Mason-Likar No significant change since last tracing Confirmed by Roselyn Dunnings [phone]) on 06/24/2024 10:29:15 AM  Radiology: CT Angio Chest/Abd/Pel for Dissection W and/or Wo Contrast Result Date: 06/23/2024 CLINICAL DATA:  Shortness of breath and tachycardia. Chest pain and back pain. EXAM: CT ANGIOGRAPHY CHEST, ABDOMEN AND PELVIS TECHNIQUE: Noncontrast chest  CT was performed. Multidetector CT imaging through the chest, abdomen and pelvis was performed using the standard protocol during bolus administration of intravenous contrast. Multiplanar reconstructed images and MIPs were obtained and reviewed to evaluate the vascular anatomy. RADIATION DOSE REDUCTION: This exam was performed according to the departmental dose-optimization program which includes automated exposure control, adjustment of the mA and/or kV according to patient size and/or use of iterative reconstruction technique. CONTRAST:  OMNIPAQUE IOHEXOL 350 MG/ML SOLN COMPARISON:  CT abdomen and pelvis 12/17/2019 FINDINGS: CTA CHEST FINDINGS Cardiovascular: Preferential opacification of the thoracic aorta. No evidence of thoracic aortic aneurysm or dissection. Normal heart size. No pericardial effusion. Mediastinum/Nodes: The left thyroid gland is heterogeneous and enlarged containing calcifications  and subcentimeter nodules. There are no enlarged mediastinal, hilar or axillary lymph nodes. Visualized esophagus is within normal limits. Lungs/Pleura: There is a 2 mm right lower lobe nodule image 7/71. The lungs are otherwise clear. There is no pleural effusion or pneumothorax. Musculoskeletal: No acute fracture identified. There is a healed right scapular fracture. Review of the MIP images confirms the above findings. CTA ABDOMEN AND PELVIS FINDINGS VASCULAR Aorta: Normal caliber aorta without aneurysm, dissection, vasculitis or significant stenosis. Celiac: Patent without evidence of aneurysm, dissection, vasculitis or significant stenosis. There is common origin of the celiac axis and superior mesenteric artery. SMA: Patent without evidence of aneurysm, dissection, vasculitis or significant stenosis. Renals: Both renal arteries are patent without evidence of aneurysm, dissection, vasculitis, fibromuscular dysplasia or significant stenosis. IMA: Patent without evidence of aneurysm, dissection, vasculitis  or significant stenosis. Inflow: Patent without evidence of aneurysm, dissection, vasculitis or significant stenosis. Veins: No obvious venous abnormality within the limitations of this arterial phase study. Review of the MIP images confirms the above findings. NON-VASCULAR Hepatobiliary: There is a subcentimeter hypodensity in the right lobe of the liver which is too small to characterize, possibly a cyst or hemangioma. Otherwise, the liver, gallbladder and bile ducts are within normal limits. Pancreas: Unremarkable. No pancreatic ductal dilatation or surrounding inflammatory changes. Spleen: Normal in size without focal abnormality. Adrenals/Urinary Tract: There are punctate nonobstructing bilateral renal calculi measuring up to 3 mm. There is no hydronephrosis. The adrenal glands and bladder are within normal limits. Stomach/Bowel: Stomach is within normal limits. Appendix appears normal. No evidence of bowel wall thickening, distention, or inflammatory changes. There are scattered colonic diverticula. There is a large amount of stool throughout the colon. Lymphatic: No enlarged lymph nodes are seen. Reproductive: Prostate is unremarkable. Other: There are small fat containing inguinal and umbilical hernias. There is no ascites. There is a rounded subcutaneous density just beneath the skin surface measuring 8 mm in the anterior abdominal wall. Musculoskeletal: No fracture is seen. Review of the MIP images confirms the above findings. IMPRESSION: 1. No evidence for aortic dissection or aneurysm. 2. No acute localizing process in the chest, abdomen or pelvis. 3. Nonobstructing bilateral renal calculi. 4. Colonic diverticulosis. 5. 2 mm right solid pulmonary nodule. No follow-up needed if patient is low-risk.This recommendation follows the consensus statement: Guidelines for Management of Incidental Pulmonary Nodules Detected on CT Images: From the Fleischner Society 2017; Radiology 2017; 284:228-243. 6. Incidental  heterogeneous and enlarged thyroid. Recommend thyroid ultrasound (ref: J Am Coll Radiol. 2015 Feb;12(2): 143-50). Electronically Signed   By: Greig Pique M.D.   On: 06/23/2024 16:43   DG Chest Port 1 View Result Date: 06/23/2024 CLINICAL DATA:  Short of breath, chest and back pain for 4 days. EXAM: PORTABLE CHEST 1 VIEW COMPARISON:  09/03/2023. FINDINGS: Cardiac silhouette is normal in size. No mediastinal or hilar masses. Clear lungs.  No pleural effusion or pneumothorax. Skeletal structures are grossly intact. IMPRESSION: No active disease. Electronically Signed   By: Alm Parkins M.D.   On: 06/23/2024 14:06     Procedures   Medications Ordered in the ED  sodium chloride  flush (NS) 0.9 % injection 3 mL (3 mLs Intravenous Given 06/23/24 1326)  morphine (PF) 4 MG/ML injection 4 mg (4 mg Intravenous Given 06/23/24 1335)  ondansetron  (ZOFRAN ) injection 4 mg (4 mg Intravenous Given 06/23/24 1335)  aspirin chewable tablet 324 mg (324 mg Oral Given 06/23/24 1452)  iohexol (OMNIPAQUE) 350 MG/ML injection 100 mL (100 mLs Intravenous Contrast Given  06/23/24 1550)  morphine (PF) 4 MG/ML injection 4 mg (4 mg Intravenous Given 06/23/24 1621)  levalbuterol (XOPENEX) nebulizer solution 1.25 mg (1.25 mg Nebulization Given 06/23/24 1700)  naproxen (NAPROSYN) tablet 500 mg (500 mg Oral Given 06/23/24 1821)  methocarbamol  (ROBAXIN ) tablet 500 mg (500 mg Oral Given 06/23/24 1821)                                    Medical Decision Making Patient presenting with rather sudden onset chest pain which radiates between his shoulder blades, present for 4 days, worse with exertion.  Differential diagnosis including ACS, PE, dissection, pneumonia, acid reflux/esophagitis.  No abdominal complaints to suggest acute cholecystitis, PUD.  Patient with a history of prostate cancer and increased risk for PE, with radiation into his back there is also concern for aortic dissection, therefore dissection study was  completed and is thankfully negative for this condition and negative for PE as well.  Lab tests and EKG are reassuring with no sign of cardial pulmonary involvement.  Given its positional changes, this probably reflects a musculoskeletal source.  He is prescribed Robaxin  and naproxen, advise close follow-up with his primary provider if symptoms persist, return here if worsening.  Amount and/or Complexity of Data Reviewed Labs: ordered.    Details: Delta troponins are negative, his be met, CBC reviewed, he does have a leukocytosis at 14.3 of unclear etiology, however his last several WBCs have been in the 13-13.5 range. Radiology: ordered.    Details: CT dissection study negative for PE and negative for dissection, also negative for pneumonia.  Possible pulmonary nodule and thyromegaly, patient was advised of these findings he should follow-up with his oncology providers regarding these findings for further management. ECG/medicine tests: ordered.  Risk Prescription drug management.        Final diagnoses:  Muscle strain of chest wall, initial encounter    ED Discharge Orders          Ordered    methocarbamol  (ROBAXIN ) 500 MG tablet  2 times daily        06/23/24 1841    naproxen (NAPROSYN) 500 MG tablet  2 times daily        06/23/24 1841               Birdena Clarity, PA-C 06/24/24 1807    Cleotilde Rogue, MD 06/26/24 2032

## 2024-07-01 ENCOUNTER — Emergency Department (HOSPITAL_COMMUNITY)
Admission: EM | Admit: 2024-07-01 | Discharge: 2024-07-01 | Disposition: A | Discharge status: Home / Self Care | Attending: Emergency Medicine | Admitting: Emergency Medicine

## 2024-07-01 ENCOUNTER — Encounter (HOSPITAL_COMMUNITY): Payer: Self-pay

## 2024-07-01 ENCOUNTER — Other Ambulatory Visit: Payer: Self-pay

## 2024-07-01 ENCOUNTER — Emergency Department (HOSPITAL_COMMUNITY)

## 2024-07-01 DIAGNOSIS — R0600 Dyspnea, unspecified: Secondary | ICD-10-CM

## 2024-07-01 DIAGNOSIS — S22060A Wedge compression fracture of T7-T8 vertebra, initial encounter for closed fracture: Secondary | ICD-10-CM

## 2024-07-01 LAB — CBC WITH DIFFERENTIAL/PLATELET
Abs Immature Granulocytes: 0.08 K/uL — ABNORMAL HIGH (ref 0.00–0.07)
Basophils Absolute: 0.1 K/uL (ref 0.0–0.1)
Basophils Relative: 1 %
Eosinophils Absolute: 0.1 K/uL (ref 0.0–0.5)
Eosinophils Relative: 1 %
HCT: 37 % — ABNORMAL LOW (ref 39.0–52.0)
Hemoglobin: 11.8 g/dL — ABNORMAL LOW (ref 13.0–17.0)
Immature Granulocytes: 1 %
Lymphocytes Relative: 13 %
Lymphs Abs: 1.4 K/uL (ref 0.7–4.0)
MCH: 30.4 pg (ref 26.0–34.0)
MCHC: 31.9 g/dL (ref 30.0–36.0)
MCV: 95.4 fL (ref 80.0–100.0)
Monocytes Absolute: 1.1 K/uL — ABNORMAL HIGH (ref 0.1–1.0)
Monocytes Relative: 10 %
Neutro Abs: 8.1 K/uL — ABNORMAL HIGH (ref 1.7–7.7)
Neutrophils Relative %: 74 %
Platelets: 337 K/uL (ref 150–400)
RBC: 3.88 MIL/uL — ABNORMAL LOW (ref 4.22–5.81)
RDW: 13.8 % (ref 11.5–15.5)
WBC: 10.8 K/uL — ABNORMAL HIGH (ref 4.0–10.5)
nRBC: 0 % (ref 0.0–0.2)

## 2024-07-01 LAB — COMPREHENSIVE METABOLIC PANEL WITH GFR
ALT: 7 U/L (ref 0–44)
AST: 16 U/L (ref 15–41)
Albumin: 3.5 g/dL (ref 3.5–5.0)
Alkaline Phosphatase: 115 U/L (ref 38–126)
Anion gap: 6 (ref 5–15)
BUN: 24 mg/dL — ABNORMAL HIGH (ref 8–23)
CO2: 25 mmol/L (ref 22–32)
Calcium: 9 mg/dL (ref 8.9–10.3)
Chloride: 107 mmol/L (ref 98–111)
Creatinine, Ser: 0.94 mg/dL (ref 0.61–1.24)
GFR, Estimated: >60 mL/min (ref >60)
Glucose, Bld: 101 mg/dL — ABNORMAL HIGH (ref 70–99)
Potassium: 3.8 mmol/L (ref 3.5–5.1)
Sodium: 138 mmol/L (ref 135–145)
Total Bilirubin: 0.9 mg/dL (ref 0.0–1.2)
Total Protein: 7.2 g/dL (ref 6.5–8.1)

## 2024-07-01 LAB — RESP PANEL BY RT-PCR (RSV, FLU A&B, COVID)  RVPGX2
Influenza A by PCR: NEGATIVE
Influenza B by PCR: NEGATIVE
Resp Syncytial Virus by PCR: NEGATIVE
SARS Coronavirus 2 by RT PCR: NEGATIVE

## 2024-07-01 LAB — TROPONIN I (HIGH SENSITIVITY)
Troponin I (High Sensitivity): 3 ng/L (ref <18)
Troponin I (High Sensitivity): 3 ng/L (ref <18)

## 2024-07-01 LAB — BRAIN NATRIURETIC PEPTIDE: B Natriuretic Peptide: 26.4 pg/mL (ref 0.0–100.0)

## 2024-07-01 MED ORDER — OXYCODONE HCL 5 MG PO TABS: 5.0000 mg | ORAL_TABLET | ORAL | 0 refills | Status: AC | PRN

## 2024-07-01 MED ORDER — IOHEXOL 350 MG/ML SOLN
75.0000 mL | Freq: Once | INTRAVENOUS | Status: AC | PRN
  Administered 2024-07-01: 75 mL via INTRAVENOUS

## 2024-07-01 NOTE — ED Provider Triage Note (Signed)
 Emergency Medicine Provider Triage Evaluation Note  Burrel Legrand , a 63 y.o. male  was evaluated in triage.  Pt complains of SOB, progressive over the last one month. History of prostate CA (oral chemo), recent knee replacement (August, off anticoagulation). Denies fever. Has intermittent sharp central CP that goes to the back. Denies h/o CHF  Review of Systems  Positive: Sob, CP Negative: Fever, cough, nausea, vomiting  Physical Exam  BP (!) 123/92 (BP Location: Left Arm)   Pulse (!) 104   Resp 18   SpO2 99%  Gen:   Awake, no distress   Resp:  Normal effort  MSK:   Moves extremities without difficulty  Other:  Minimal rales R>L base  Medical Decision Making  Medically screening exam initiated at 12:11 PM.  Appropriate orders placed.  Evalene Reggy Huxford was informed that the remainder of the evaluation will be completed by another provider, this initial triage assessment does not replace that evaluation, and the importance of remaining in the ED until their evaluation is complete.     Odell Balls, PA-C 07/01/24 1215

## 2024-07-01 NOTE — ED Triage Notes (Signed)
 Pt having shortness of breath starting over a month ago but has worsened over the past few days

## 2024-07-01 NOTE — ED Provider Notes (Signed)
 [place] EMERGENCY DEPARTMENT AT [facility] Provider Note   CSN: [id number] Arrival date & time: 07/01/24  1127     Patient presents with: Shortness of Breath   Randall Dawson is a 63 y.o. male.  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 Authorizing Provider  abiraterone  acetate (ZYTIGA ) 250 MG tablet Take 4 tablets (1,000 mg total) by mouth daily. Take on an empty stomach 1 hour before or 2 hours after a meal 05/01/24   Davonna Siad, MD  apixaban  (ELIQUIS ) 2.5 MG TABS tablet Take 1 tablet (2.5 mg total) by mouth 2 (two) times daily. 03/20/24   Edna Toribio LABOR, MD  bisacodyl (DULCOLAX) 5 MG EC tablet Take 5 mg by mouth daily as needed for moderate constipation.    [provider]  Calcium Carbonate (CALCIUM 600 PO) Take 600 mg by mouth daily.    [provider]  celecoxib  (CELEBREX ) 100 MG capsule Take 100 mg by mouth 2 (two) times daily.    [provider]  furosemide  (LASIX ) 20 MG tablet Take 1 tablet (20 mg total) by mouth daily. 05/15/24   Kandala, Hyndavi, MD  HYDROcodone-acetaminophen  (NORCO) 10-325 MG tablet Take 1 tablet by mouth every 4 (four) hours as needed for moderate pain (pain score 4-6) or severe pain (pain score 7-10). 06/17/22   [provider]  meloxicam (MOBIC) 15 MG tablet Take 15 mg by mouth daily. 04/11/24   [provider]  methocarbamol  (ROBAXIN ) 500 MG tablet Take 1 tablet (500 mg total) by mouth 2 (two) times daily. 06/23/24   Idol, Julie, PA-C  naproxen  (NAPROSYN ) 500 MG tablet Take 1 tablet (500 mg total) by mouth 2 (two) times daily. 06/23/24   Idol, Julie, PA-C  oxyCODONE  (OXY IR/ROXICODONE ) 5 MG immediate release tablet Take 5 mg by mouth every 8 (eight) hours as needed. 04/15/24   [provider]  potassium chloride  (KLOR-CON ) 10 MEQ tablet TAKE 1 TABLET(10 MEQ) BY MOUTH DAILY 01/30/24   Rogers Hai, MD  predniSONE  (DELTASONE ) 5 MG tablet Take 1 tablet (5 mg total) by mouth 2 (two) times daily with a meal. 03/14/24   Davonna Siad, MD    Allergies: Patient has no known allergies.    Review of Systems  Updated Vital Signs BP (!) 123/92 (BP Location: Left Arm)   Pulse (!) 104   Temp (!) 97.4 F (36.3 C) (Oral)   Resp 18   Ht 5' 9 (1.753 m)   Wt 113.4 kg   SpO2 99%   BMI 36.92 kg/m   Physical Exam  (all labs ordered are listed, but only abnormal results are displayed) Labs Reviewed  RESP PANEL BY RT-PCR (RSV, FLU A&B, COVID)  [id number]  CBC WITH DIFFERENTIAL/PLATELET  COMPREHENSIVE METABOLIC PANEL WITH GFR  BRAIN NATRIURETIC PEPTIDE  TROPONIN I (HIGH SENSITIVITY)  EKG: None  Radiology: No results found.  {Document cardiac monitor, telemetry assessment procedure when appropriate:32947} Procedures   Medications Ordered in the ED - No data to display    {Click here for ABCD2, HEART and other calculators REFRESH Note before signing:1}                              Medical Decision Making  ***  {Document critical care time when appropriate  Document review of labs and clinical decision tools ie [id number], etc  Document your independent review of radiology images and any outside records  Document your discussion with family members, caretakers and with consultants  Document social determinants of health affecting pt's care  Document your  decision making why or why not admission, treatments were needed:32947:::1}   Final diagnoses:  None    ED Discharge Orders     None

## 2024-07-01 NOTE — ED Notes (Signed)
Pt being transported to x-ray

## 2024-07-22 ENCOUNTER — Other Ambulatory Visit (HOSPITAL_COMMUNITY): Payer: Self-pay | Admitting: Neurosurgery

## 2024-07-22 ENCOUNTER — Encounter: Payer: Self-pay | Admitting: *Deleted

## 2024-07-22 DIAGNOSIS — S22060A Wedge compression fracture of T7-T8 vertebra, initial encounter for closed fracture: Secondary | ICD-10-CM

## 2024-07-24 ENCOUNTER — Inpatient Hospital Stay: Attending: Hematology

## 2024-07-24 DIAGNOSIS — C772 Secondary and unspecified malignant neoplasm of intra-abdominal lymph nodes: Secondary | ICD-10-CM | POA: Insufficient documentation

## 2024-07-24 DIAGNOSIS — D508 Other iron deficiency anemias: Secondary | ICD-10-CM

## 2024-07-24 DIAGNOSIS — D649 Anemia, unspecified: Secondary | ICD-10-CM | POA: Insufficient documentation

## 2024-07-24 DIAGNOSIS — C61 Malignant neoplasm of prostate: Secondary | ICD-10-CM | POA: Diagnosis present

## 2024-07-24 LAB — COMPREHENSIVE METABOLIC PANEL WITH GFR
ALT: <5 U/L (ref 0–44)
AST: 15 U/L (ref 15–41)
Albumin: 4.2 g/dL (ref 3.5–5.0)
Alkaline Phosphatase: 120 U/L (ref 38–126)
Anion gap: 7 (ref 5–15)
BUN: 16 mg/dL (ref 8–23)
CO2: 29 mmol/L (ref 22–32)
Calcium: 9.6 mg/dL (ref 8.9–10.3)
Chloride: 106 mmol/L (ref 98–111)
Creatinine, Ser: 0.9 mg/dL (ref 0.61–1.24)
GFR, Estimated: >60 mL/min
Glucose, Bld: 89 mg/dL (ref 70–99)
Potassium: 3.7 mmol/L (ref 3.5–5.1)
Sodium: 142 mmol/L (ref 135–145)
Total Bilirubin: 0.4 mg/dL (ref 0.0–1.2)
Total Protein: 7.3 g/dL (ref 6.5–8.1)

## 2024-07-24 LAB — CBC WITH DIFFERENTIAL/PLATELET
Abs Immature Granulocytes: 0.07 K/uL (ref 0.00–0.07)
Basophils Absolute: 0.1 K/uL (ref 0.0–0.1)
Basophils Relative: 1 %
Eosinophils Absolute: 0.3 K/uL (ref 0.0–0.5)
Eosinophils Relative: 4 %
HCT: 39.7 % (ref 39.0–52.0)
Hemoglobin: 12.5 g/dL — ABNORMAL LOW (ref 13.0–17.0)
Immature Granulocytes: 1 %
Lymphocytes Relative: 20 %
Lymphs Abs: 1.6 K/uL (ref 0.7–4.0)
MCH: 30.6 pg (ref 26.0–34.0)
MCHC: 31.5 g/dL (ref 30.0–36.0)
MCV: 97.1 fL (ref 80.0–100.0)
Monocytes Absolute: 0.9 K/uL (ref 0.1–1.0)
Monocytes Relative: 12 %
Neutro Abs: 5.1 K/uL (ref 1.7–7.7)
Neutrophils Relative %: 62 %
Platelets: 294 K/uL (ref 150–400)
RBC: 4.09 MIL/uL — ABNORMAL LOW (ref 4.22–5.81)
RDW: 13.7 % (ref 11.5–15.5)
WBC: 8.1 K/uL (ref 4.0–10.5)
nRBC: 0 % (ref 0.0–0.2)

## 2024-07-24 LAB — FERRITIN: Ferritin: 261 ng/mL (ref 24–336)

## 2024-07-24 LAB — VITAMIN B12: Vitamin B-12: 448 pg/mL (ref 180–914)

## 2024-07-24 LAB — IRON AND TIBC
Iron: 36 ug/dL — ABNORMAL LOW (ref 45–182)
Saturation Ratios: 12 % — ABNORMAL LOW (ref 17.9–39.5)
TIBC: 288 ug/dL (ref 250–450)
UIBC: 253 ug/dL

## 2024-07-24 LAB — PSA: Prostatic Specific Antigen: 0.12 ng/mL (ref 0.00–4.00)

## 2024-07-24 LAB — FOLATE: Folate: 10.5 ng/mL

## 2024-07-30 ENCOUNTER — Ambulatory Visit (HOSPITAL_COMMUNITY)

## 2024-07-30 ENCOUNTER — Encounter (HOSPITAL_COMMUNITY): Payer: Self-pay

## 2024-08-01 ENCOUNTER — Inpatient Hospital Stay: Attending: Hematology | Admitting: Oncology

## 2024-08-01 VITALS — BP 112/71 | HR 115 | Temp 98.9°F | Resp 19 | Ht 69.0 in | Wt 241.0 lb

## 2024-08-01 DIAGNOSIS — C61 Malignant neoplasm of prostate: Secondary | ICD-10-CM

## 2024-08-01 DIAGNOSIS — K769 Liver disease, unspecified: Secondary | ICD-10-CM | POA: Diagnosis not present

## 2024-08-01 DIAGNOSIS — C772 Secondary and unspecified malignant neoplasm of intra-abdominal lymph nodes: Secondary | ICD-10-CM | POA: Diagnosis not present

## 2024-08-01 DIAGNOSIS — R600 Localized edema: Secondary | ICD-10-CM | POA: Diagnosis not present

## 2024-08-01 DIAGNOSIS — M8580 Other specified disorders of bone density and structure, unspecified site: Secondary | ICD-10-CM | POA: Diagnosis not present

## 2024-08-01 DIAGNOSIS — D508 Other iron deficiency anemias: Secondary | ICD-10-CM | POA: Diagnosis not present

## 2024-08-01 DIAGNOSIS — E876 Hypokalemia: Secondary | ICD-10-CM

## 2024-08-01 MED ORDER — OXYCODONE-ACETAMINOPHEN 5-325 MG PO TABS: 1.0000 | ORAL_TABLET | Freq: Three times a day (TID) | ORAL | 0 refills | Status: AC | PRN

## 2024-08-01 MED ORDER — FERROUS SULFATE 325 (65 FE) MG PO TBEC: 325.0000 mg | DELAYED_RELEASE_TABLET | ORAL | 3 refills | Status: AC

## 2024-08-01 NOTE — Progress Notes (Signed)
 Patient is taking Zytiga as prescribed. He has not missed any doses and reports no side effects at this time.

## 2024-08-01 NOTE — Progress Notes (Signed)
 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 Receiving denosumab  and Eligard  every 6 months  -We reviewed labs today.  CMP: WNL, CBC: Hb:12.5, WBC and platelets: Normal - PSA trending up with a doubling time of 5 months. Will obtain PSMA PET scan.  - Continue abiraterone  1000 mg and prednisone  5 mg daily.  No significant side effects reported today. - Continue denosumab  60 mg every 6 months - Continue Lupron  45 mg every 6 months  Return to clinic in 1 month with PSMA PET scan to discuss results and further management  Anemia Mild anemia with low hemoglobin Labs consistent with iron deficiency with a TSAT of 12  -We discussed some of the risks, benefits, and alternatives of intravenous iron infusions. The patient is symptomatic from anemia and the iron level is critically low. He tolerated oral iron supplement poorly and desires to achieve higher levels of iron faster for adequate hematopoesis. Some of the side-effects to be expected including risks of infusion reactions, phlebitis, headaches, nausea and fatigue.  The patient is willing to proceed. Patient education material was dispensed. Goal is to keep ferritin level greater than 50 and resolution of anemia -Start oral iron every other day.  Use MiraLAX for constipation  Will repeat labs in 8 weeks  Lower extremity edema Intermittent right foot  swelling, possibly related to knee replacement, Lasix  taken as needed.  - Advise to take Lasix  only if swelling is bothersome. - Monitor swelling and adjust Lasix  use accordingly.  Hypokalemia Patient previously had a history of hypokalemia and is currently taking potassium supplements every day  - Recommended patient to stop taking potassium supplements unless he is back on Lasix  every day  Osteopenia Currently on denosumab  and vitamin D  and calcium supplementation  -Continue denosumab  60 mg every 6 months - Continue vitamin D  and calcium supplementation daily  Back pain Likely secondary to T7 compression fracture  - Will prescribe hydrocodone as needed.   Hepatic lesion Patient has a very small hepatic lesion on MRI liver from 2021 Patient is currently asymptomatic Recommended repeat CT abdomen with liver protocol in 6 months but was never done  - Will obtain MRI liver.   The patient understands the plans discussed today and is in agreement with them.  He knows to contact our office if he develops concerns prior to his next appointment.  40 minutes of total time was spent for this patient encounter, including preparation,review of records,  face-to-face counseling with the patient and coordination of care, physical exam, and documentation of the encounter.    LILLETTE Verneta SAUNDERS Teague,acting as a neurosurgeon for Mickiel Dry, MD.,have documented all relevant documentation on the behalf of Mickiel Dry, MD,as directed by  Mickiel Dry, MD while in the presence of Mickiel Dry, MD.  I, Mickiel Dry MD, have reviewed the above documentation for accuracy and completeness, and I agree with the above.    Mickiel Dry, MD  [facility] - A DEPT OF [facility] [address] [place] [address] Dept: [phone] Dept Fax: [phone]   Orders Placed This Encounter  Procedures   NM PET (PSMA) SKULL TO MID  THIGH    Standing Status:   Future    Expected Date:   08/15/2024    Expiration Date:   08/01/2025    If indicated for the ordered procedure, I authorize the administration of a radiopharmaceutical per Radiology protocol:   Yes    Preferred imaging location?:   [facility]   MR LIVER W WO CONTRAST    Standing Status:   Future    Expiration Date:   08/01/2025    If indicated for the ordered procedure, I authorize the administration of contrast media per Radiology protocol:   Yes    What is the patient's sedation requirement?:   No Sedation    Does the patient have a pacemaker or implanted devices?:   No    Preferred imaging location?:   [facility] (table limit - 500lbs)   CBC with Differential    Standing Status:   Future    Expected Date:   11/13/2024    Expiration Date:   02/11/2025   Comprehensive metabolic panel    Standing Status:   Future    Expected Date:   11/13/2024    Expiration Date:   02/11/2025   PSA    Standing Status:   Future    Expected Date:   11/13/2024    Expiration Date:   02/11/2025     ONCOLOGY HISTORY:   I have reviewed his chart and materials related to his cancer extensively and collaborated history with the patient. 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  Pathology: Prostatic adenocarcinoma in all 12 cores, Gleason pattern 4 + 4 = 8, prognostic grade group 4.  -12/03/2019: PSA 119.87 -12/17/2019: Serum testosterone: 197 -12/17/2019: NM Bone scan: Small focus of uptake seen left inferior orbit, favor benign etiology. No definitive scintigraphic evidence of osseous metastatic disease.  -12/17/2019: CT AP: Retroperitoneal and bilateral iliac chain metastatic adenopathy. Ill-defined hypoenhancing 12 mm hepatic lesion, indeterminate however concerning for metastases. Sclerotic focus in the right posterior ilium, indeterminate for  metastasis.  -01/10/2020: MRI liver: Tiny sub-cm lesion in the posterior right hepatic lobe is too small to characterize. No other significant abnormality identified.  -01/29/2020: Degarelix  -02/01/2020-current: Abiraterone  1000 mg daily and prednisone  5 mg daily -02/26/2020- Current: Lupron  45 mg every 6 months -03/05/2020: [facility] germline mutation testing: Negative -07/15/2021: Bone density scan with T-score of -1.7 (osteopenic) -09/23/2021-current: Denosumab  60 mg every 6 months  Current Treatment:  Abiraterone  + Prednisone  daily, Lupron  and denosumab  monthly  INTERVAL HISTORY:   Leelan Rajewski Schleicher is here today for follow-up of prostate cancer.   We discussed lab results today, which showed iron deficiency. I recommended he start oral iron supplements after being given IV iron.   He is tolerating abiraterone  well and denies any hot flashes. He reports joint pain, worsened in the hips. He also notes severe pain in the left shoulder, Yechiel attributes to a T7 compression fracture. He was previously prescribed hydrocodone for the pain and would like a refill of the medication.   He is taking potassium supplements daily and has not required lasix  recently. We discussed taking potassium only when  taking lasix .   I have reviewed the past medical history, past surgical history, social history and family history with the patient and they are unchanged from previous note.  ALLERGIES:  has no known allergies.  MEDICATIONS:  Current Outpatient Medications  Medication Sig Dispense Refill   abiraterone  acetate (ZYTIGA ) 250 MG tablet Take 4 tablets (1,000 mg total) by mouth daily. Take on an empty stomach 1 hour before or 2 hours after a meal 120 tablet 11   apixaban  (ELIQUIS ) 2.5 MG TABS tablet Take 1 tablet (2.5 mg total) by mouth 2 (two) times daily. 60 tablet 0   bisacodyl (DULCOLAX) 5 MG EC tablet Take 5 mg by mouth daily as needed for moderate constipation.     Calcium Carbonate  (CALCIUM 600 PO) Take 600 mg by mouth daily.     celecoxib  (CELEBREX ) 100 MG capsule Take 100 mg by mouth 2 (two) times daily.     ferrous sulfate  325 (65 FE) MG EC tablet Take 1 tablet (325 mg total) by mouth every other day. 45 tablet 3   furosemide  (LASIX ) 20 MG tablet Take 1 tablet (20 mg total) by mouth daily. 30 tablet 2   HYDROcodone-acetaminophen  (NORCO) 10-325 MG tablet Take 1 tablet by mouth every 4 (four) hours as needed for moderate pain (pain score 4-6) or severe pain (pain score 7-10).     meloxicam (MOBIC) 15 MG tablet Take 15 mg by mouth daily.     methocarbamol  (ROBAXIN ) 500 MG tablet Take 1 tablet (500 mg total) by mouth 2 (two) times daily. 20 tablet 0   naproxen  (NAPROSYN ) 500 MG tablet Take 1 tablet (500 mg total) by mouth 2 (two) times daily. 20 tablet 0   oxyCODONE  (ROXICODONE ) 5 MG immediate release tablet Take 1 tablet (5 mg total) by mouth every 4 (four) hours as needed for severe pain (pain score 7-10). 15 tablet 0   oxyCODONE -acetaminophen  (PERCOCET/ROXICET) 5-325 MG tablet Take 1 tablet by mouth every 8 (eight) hours as needed for severe pain (pain score 7-10). 90 tablet 0   potassium chloride  (KLOR-CON ) 10 MEQ tablet TAKE 1 TABLET(10 MEQ) BY MOUTH DAILY 30 tablet 6   predniSONE  (DELTASONE ) 5 MG tablet Take 1 tablet (5 mg total) by mouth 2 (two) times daily with a meal. 60 tablet 11   No current facility-administered medications for this visit.    VITALS:  Blood pressure 112/71, pulse (!) 115, temperature 98.9 F (37.2 C), temperature source Tympanic, resp. rate 19, height 5' 9 (1.753 m), weight 241 lb (109.3 kg), SpO2 100%.  Wt Readings from Last 3 Encounters:  08/01/24 241 lb (109.3 kg)  07/01/24 250 lb (113.4 kg)  06/23/24 252 lb (114.3 kg)    Body mass index is 35.59 kg/m.  Performance status (ECOG): 1 - Symptomatic but completely ambulatory  PHYSICAL EXAM:   GENERAL:alert, no distress and comfortable LYMPH:  no palpable lymphadenopathy in the  cervical, axillary or inguinal LUNGS: clear to auscultation and percussion with normal breathing effort HEART: regular rate & rhythm and no murmurs and no lower extremity edema ABDOMEN:abdomen soft, non-tender and normal bowel sounds Musculoskeletal:no cyanosis of digits and no clubbing  NEURO: alert & oriented x 3 with fluent speech  LABORATORY DATA:  I have reviewed the data as listed  Lab Results  Component Value Date   WBC 8.1 07/24/2024   NEUTROABS 5.1 07/24/2024   HGB 12.5 (L) 07/24/2024   HCT 39.7 07/24/2024   MCV 97.1 07/24/2024   PLT 294 07/24/2024  Chemistry      Component Value Date/Time   NA 142 07/24/2024 1434   K 3.7 07/24/2024 1434   CL 106 07/24/2024 1434   CO2 29 07/24/2024 1434   BUN 16 07/24/2024 1434   CREATININE 0.90 07/24/2024 1434      Component Value Date/Time   CALCIUM 9.6 07/24/2024 1434   ALKPHOS 120 07/24/2024 1434   AST 15 07/24/2024 1434   ALT <5 07/24/2024 1434   BILITOT 0.4 07/24/2024 1434      Latest Reference Range & Units 07/24/24 14:33 07/24/24 14:34  Iron 45 - 182 ug/dL  36 (L)  UIBC ug/dL  746  TIBC 749 - 549 ug/dL  711  Saturation Ratios 17.9 - 39.5 %  12 (L)  Ferritin 24 - 336 ng/mL  261  Folate >5.9 ng/mL 10.5   Vitamin B12 180 - 914 pg/mL  448  (L): Data is abnormally low   Latest Reference Range & Units 07/24/24 14:34  Prostatic Specific Antigen 0.00 - 4.00 ng/mL 0.12    RADIOGRAPHIC STUDIES: I have personally reviewed the radiological images as listed and agreed with the findings in the report.    "

## 2024-08-01 NOTE — Patient Instructions (Addendum)
 [facility] at [facility] Discharge Instructions   You were seen and examined today by Dr. Davonna.  She reviewed the results of your lab work which are normal/stable.   Continue Zytiga  and prednisone  as prescribed.   We will see you back after the MRI of the liver and PET scan to review results.  Return as scheduled.    Thank you for choosing [facility] at [facility] to provide your oncology and hematology care.  To afford each patient quality time with our provider, please arrive at least 15 minutes before your scheduled appointment time.   If you have a lab appointment with the Cancer Center please come in thru the Main Entrance and check in at the main information desk.  You need to re-schedule your appointment should you arrive 10 or more minutes late.  We strive to give you quality time with our providers, and arriving late affects you and other patients whose appointments are after yours.  Also, if you no show three or more times for appointments you may be dismissed from the clinic at the providers discretion.     Again, thank you for choosing [facility].  Our hope is that these requests will decrease the amount of time that you wait before being seen by our physicians.       _____________________________________________________________  Should you have questions after your visit to [facility], please contact our office at [phone] and follow the prompts.  Our office hours are 8:00 a.m. and 4:30 p.m. Monday - Friday.  Please note that voicemails left after 4:00 p.m. may not be returned until the following business day.  We are closed weekends and major holidays.  You do have access to a nurse 24-7, just call the main number to the clinic [phone] and do not press any options, hold on the line and a nurse will answer the phone.    For prescription refill requests, have your pharmacy contact our office  and allow 72 hours.    Due to Covid, you will need to wear a mask upon entering the hospital. If you do not have a mask, a mask will be given to you at the Main Entrance upon arrival. For doctor visits, patients may have 1 support person age 33 or older with them. For treatment visits, patients can not have anyone with them due to social distancing guidelines and our immunocompromised population.

## 2024-08-11 ENCOUNTER — Encounter (HOSPITAL_COMMUNITY): Payer: Self-pay

## 2024-08-12 ENCOUNTER — Ambulatory Visit (HOSPITAL_COMMUNITY)
Admission: RE | Admit: 2024-08-12 | Discharge: 2024-08-12 | Disposition: A | Discharge status: Home / Self Care | Source: Ambulatory Visit | Attending: Oncology | Admitting: Oncology

## 2024-08-12 DIAGNOSIS — K769 Liver disease, unspecified: Secondary | ICD-10-CM | POA: Insufficient documentation

## 2024-08-12 DIAGNOSIS — C61 Malignant neoplasm of prostate: Secondary | ICD-10-CM | POA: Insufficient documentation

## 2024-08-12 DIAGNOSIS — C772 Secondary and unspecified malignant neoplasm of intra-abdominal lymph nodes: Secondary | ICD-10-CM | POA: Diagnosis present

## 2024-08-12 MED ORDER — GADOBUTROL 1 MMOL/ML IV SOLN
10.0000 mL | Freq: Once | INTRAVENOUS | Status: AC | PRN
  Administered 2024-08-12: 10 mL via INTRAVENOUS

## 2024-08-15 ENCOUNTER — Encounter (HOSPITAL_COMMUNITY)
Admission: RE | Admit: 2024-08-15 | Discharge: 2024-08-15 | Disposition: A | Discharge status: Home / Self Care | Source: Ambulatory Visit | Attending: Oncology | Admitting: Oncology

## 2024-08-15 DIAGNOSIS — C61 Malignant neoplasm of prostate: Secondary | ICD-10-CM | POA: Diagnosis present

## 2024-08-15 DIAGNOSIS — C772 Secondary and unspecified malignant neoplasm of intra-abdominal lymph nodes: Secondary | ICD-10-CM | POA: Insufficient documentation

## 2024-08-15 DIAGNOSIS — K769 Liver disease, unspecified: Secondary | ICD-10-CM | POA: Diagnosis present

## 2024-08-15 MED ORDER — FLUDEOXYGLUCOSE F - 18 (FDG) INJECTION: 8.7300 | Freq: Once | INTRAVENOUS | Status: DC | PRN

## 2024-08-15 MED ORDER — FLOTUFOLASTAT F 18 GALLIUM 296-5846 MBQ/ML IV SOLN
8.7300 | Freq: Once | INTRAVENOUS | Status: AC
  Administered 2024-08-15: 8.73 via INTRAVENOUS

## 2024-08-21 ENCOUNTER — Inpatient Hospital Stay: Admitting: Oncology

## 2024-08-23 ENCOUNTER — Inpatient Hospital Stay: Admitting: Oncology

## 2024-08-23 VITALS — BP 138/93 | HR 89 | Temp 98.3°F | Resp 16 | Wt 244.5 lb

## 2024-08-23 DIAGNOSIS — E876 Hypokalemia: Secondary | ICD-10-CM | POA: Diagnosis not present

## 2024-08-23 DIAGNOSIS — K769 Liver disease, unspecified: Secondary | ICD-10-CM | POA: Diagnosis not present

## 2024-08-23 DIAGNOSIS — C61 Malignant neoplasm of prostate: Secondary | ICD-10-CM | POA: Diagnosis not present

## 2024-08-23 DIAGNOSIS — C772 Secondary and unspecified malignant neoplasm of intra-abdominal lymph nodes: Secondary | ICD-10-CM | POA: Diagnosis not present

## 2024-08-23 DIAGNOSIS — D508 Other iron deficiency anemias: Secondary | ICD-10-CM | POA: Diagnosis not present

## 2024-08-23 DIAGNOSIS — M8580 Other specified disorders of bone density and structure, unspecified site: Secondary | ICD-10-CM

## 2024-08-23 MED ORDER — HYDROCODONE-ACETAMINOPHEN 10-325 MG PO TABS: 1.0000 | ORAL_TABLET | Freq: Four times a day (QID) | ORAL | 0 refills | Status: AC | PRN

## 2024-08-23 NOTE — Addendum Note (Signed)
 Addended by: Frederick Klinger on: 08/23/2024 02:22 PM   Modules accepted: Orders

## 2024-11-21 ENCOUNTER — Inpatient Hospital Stay

## 2024-11-21 ENCOUNTER — Inpatient Hospital Stay: Admitting: Oncology

## 2024-11-22 ENCOUNTER — Inpatient Hospital Stay: Admitting: Oncology

## 2024-11-22 ENCOUNTER — Inpatient Hospital Stay
